# Patient Record
Sex: Female | Born: 1980
Health system: Southern US, Community
[De-identification: ages and names within clinical notes are randomized; demographics above are authoritative.]

## PROBLEM LIST (undated history)

## (undated) ENCOUNTER — Inpatient Hospital Stay (HOSPITAL_COMMUNITY): Payer: Self-pay

## (undated) DIAGNOSIS — F988 Other specified behavioral and emotional disorders with onset usually occurring in childhood and adolescence: Secondary | ICD-10-CM

## (undated) DIAGNOSIS — F329 Major depressive disorder, single episode, unspecified: Secondary | ICD-10-CM

## (undated) DIAGNOSIS — R112 Nausea with vomiting, unspecified: Secondary | ICD-10-CM

## (undated) DIAGNOSIS — R87629 Unspecified abnormal cytological findings in specimens from vagina: Secondary | ICD-10-CM

## (undated) DIAGNOSIS — M51369 Other intervertebral disc degeneration, lumbar region without mention of lumbar back pain or lower extremity pain: Secondary | ICD-10-CM

## (undated) DIAGNOSIS — K760 Fatty (change of) liver, not elsewhere classified: Secondary | ICD-10-CM

## (undated) DIAGNOSIS — M5136 Other intervertebral disc degeneration, lumbar region: Secondary | ICD-10-CM

## (undated) DIAGNOSIS — Z9151 Personal history of suicidal behavior: Secondary | ICD-10-CM

## (undated) DIAGNOSIS — R000 Tachycardia, unspecified: Secondary | ICD-10-CM

## (undated) DIAGNOSIS — K5900 Constipation, unspecified: Secondary | ICD-10-CM

## (undated) DIAGNOSIS — IMO0002 Reserved for concepts with insufficient information to code with codable children: Secondary | ICD-10-CM

## (undated) DIAGNOSIS — Z915 Personal history of self-harm: Secondary | ICD-10-CM

## (undated) DIAGNOSIS — R1031 Right lower quadrant pain: Secondary | ICD-10-CM

## (undated) DIAGNOSIS — N643 Galactorrhea not associated with childbirth: Secondary | ICD-10-CM

## (undated) DIAGNOSIS — I4711 Inappropriate sinus tachycardia, so stated: Secondary | ICD-10-CM

## (undated) DIAGNOSIS — Z9889 Other specified postprocedural states: Secondary | ICD-10-CM

## (undated) DIAGNOSIS — M2550 Pain in unspecified joint: Secondary | ICD-10-CM

## (undated) DIAGNOSIS — I7300 Raynaud's syndrome without gangrene: Secondary | ICD-10-CM

## (undated) DIAGNOSIS — R7989 Other specified abnormal findings of blood chemistry: Secondary | ICD-10-CM

## (undated) DIAGNOSIS — M419 Scoliosis, unspecified: Secondary | ICD-10-CM

## (undated) DIAGNOSIS — E785 Hyperlipidemia, unspecified: Secondary | ICD-10-CM

## (undated) DIAGNOSIS — G43909 Migraine, unspecified, not intractable, without status migrainosus: Secondary | ICD-10-CM

## (undated) DIAGNOSIS — M329 Systemic lupus erythematosus, unspecified: Secondary | ICD-10-CM

## (undated) DIAGNOSIS — Z789 Other specified health status: Secondary | ICD-10-CM

## (undated) DIAGNOSIS — R200 Anesthesia of skin: Secondary | ICD-10-CM

## (undated) DIAGNOSIS — N6452 Nipple discharge: Secondary | ICD-10-CM

## (undated) DIAGNOSIS — Z87898 Personal history of other specified conditions: Secondary | ICD-10-CM

## (undated) DIAGNOSIS — F32A Depression, unspecified: Secondary | ICD-10-CM

## (undated) DIAGNOSIS — F419 Anxiety disorder, unspecified: Secondary | ICD-10-CM

## (undated) DIAGNOSIS — M3500 Sicca syndrome, unspecified: Secondary | ICD-10-CM

## (undated) DIAGNOSIS — Z85828 Personal history of other malignant neoplasm of skin: Secondary | ICD-10-CM

## (undated) DIAGNOSIS — Z87410 Personal history of cervical dysplasia: Secondary | ICD-10-CM

## (undated) DIAGNOSIS — R251 Tremor, unspecified: Secondary | ICD-10-CM

## (undated) HISTORY — PX: TONSILLECTOMY: SUR1361

## (undated) HISTORY — PX: LEEP: SHX91

## (undated) HISTORY — DX: Fatty (change of) liver, not elsewhere classified: K76.0

## (undated) HISTORY — DX: Migraine, unspecified, not intractable, without status migrainosus: G43.909

## (undated) HISTORY — DX: Galactorrhea not associated with childbirth: N64.3

## (undated) HISTORY — DX: Raynaud's syndrome without gangrene: I73.00

## (undated) HISTORY — DX: Other specified abnormal findings of blood chemistry: R79.89

## (undated) HISTORY — DX: Other specified behavioral and emotional disorders with onset usually occurring in childhood and adolescence: F98.8

## (undated) HISTORY — DX: Hyperlipidemia, unspecified: E78.5

## (undated) HISTORY — DX: Constipation, unspecified: K59.00

## (undated) HISTORY — PX: DILATION AND CURETTAGE OF UTERUS: SHX78

## (undated) HISTORY — DX: Inappropriate sinus tachycardia, so stated: I47.11

## (undated) HISTORY — DX: Tachycardia, unspecified: R00.0

---
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
## 2001-07-20 ENCOUNTER — Ambulatory Visit (HOSPITAL_COMMUNITY): Admission: RE | Admit: 2001-07-20 | Discharge: 2001-07-20 | Payer: Self-pay | Admitting: Family Medicine

## 2002-01-31 ENCOUNTER — Other Ambulatory Visit: Admission: RE | Admit: 2002-01-31 | Discharge: 2002-01-31 | Payer: Self-pay | Admitting: Family Medicine

## 2003-12-16 ENCOUNTER — Emergency Department (HOSPITAL_COMMUNITY): Admission: EM | Admit: 2003-12-16 | Discharge: 2003-12-16 | Payer: Self-pay | Admitting: Emergency Medicine

## 2004-01-09 ENCOUNTER — Other Ambulatory Visit: Admission: RE | Admit: 2004-01-09 | Discharge: 2004-01-09 | Payer: Self-pay | Admitting: Obstetrics and Gynecology

## 2004-03-10 ENCOUNTER — Emergency Department (HOSPITAL_COMMUNITY): Admission: EM | Admit: 2004-03-10 | Discharge: 2004-03-10 | Payer: Self-pay | Admitting: Emergency Medicine

## 2004-03-10 ENCOUNTER — Inpatient Hospital Stay (HOSPITAL_COMMUNITY): Admission: RE | Admit: 2004-03-10 | Discharge: 2004-03-17 | Payer: Self-pay | Admitting: Psychiatry

## 2004-03-31 ENCOUNTER — Encounter (INDEPENDENT_AMBULATORY_CARE_PROVIDER_SITE_OTHER): Payer: Self-pay | Admitting: Specialist

## 2004-03-31 ENCOUNTER — Ambulatory Visit (HOSPITAL_COMMUNITY): Admission: RE | Admit: 2004-03-31 | Discharge: 2004-03-31 | Payer: Self-pay | Admitting: Obstetrics and Gynecology

## 2004-07-06 ENCOUNTER — Other Ambulatory Visit: Admission: RE | Admit: 2004-07-06 | Discharge: 2004-07-06 | Payer: Self-pay | Admitting: Obstetrics and Gynecology

## 2005-01-17 ENCOUNTER — Other Ambulatory Visit: Admission: RE | Admit: 2005-01-17 | Discharge: 2005-01-17 | Payer: Self-pay | Admitting: Obstetrics and Gynecology

## 2005-06-17 ENCOUNTER — Other Ambulatory Visit: Admission: RE | Admit: 2005-06-17 | Discharge: 2005-06-17 | Payer: Self-pay | Admitting: Obstetrics and Gynecology

## 2005-06-20 ENCOUNTER — Other Ambulatory Visit: Admission: RE | Admit: 2005-06-20 | Discharge: 2005-06-20 | Payer: Self-pay | Admitting: Obstetrics and Gynecology

## 2005-07-11 ENCOUNTER — Ambulatory Visit: Payer: Self-pay | Admitting: Internal Medicine

## 2005-07-14 ENCOUNTER — Emergency Department (HOSPITAL_COMMUNITY): Admission: EM | Admit: 2005-07-14 | Discharge: 2005-07-14 | Payer: Self-pay | Admitting: Emergency Medicine

## 2005-07-19 ENCOUNTER — Ambulatory Visit: Payer: Self-pay | Admitting: Internal Medicine

## 2005-07-19 ENCOUNTER — Inpatient Hospital Stay (HOSPITAL_COMMUNITY): Admission: EM | Admit: 2005-07-19 | Discharge: 2005-07-21 | Payer: Self-pay | Admitting: *Deleted

## 2005-07-23 ENCOUNTER — Emergency Department (HOSPITAL_COMMUNITY): Admission: EM | Admit: 2005-07-23 | Discharge: 2005-07-23 | Payer: Self-pay | Admitting: Emergency Medicine

## 2005-08-03 ENCOUNTER — Inpatient Hospital Stay (HOSPITAL_COMMUNITY): Admission: RE | Admit: 2005-08-03 | Discharge: 2005-08-07 | Payer: Self-pay | Admitting: Psychiatry

## 2005-08-04 ENCOUNTER — Encounter: Payer: Self-pay | Admitting: Psychiatry

## 2005-08-04 ENCOUNTER — Ambulatory Visit: Payer: Self-pay | Admitting: Psychiatry

## 2005-08-20 ENCOUNTER — Inpatient Hospital Stay (HOSPITAL_COMMUNITY): Admission: EM | Admit: 2005-08-20 | Discharge: 2005-08-23 | Payer: Self-pay | Admitting: Emergency Medicine

## 2005-08-21 ENCOUNTER — Ambulatory Visit: Payer: Self-pay | Admitting: Internal Medicine

## 2006-02-09 ENCOUNTER — Emergency Department (HOSPITAL_COMMUNITY): Admission: EM | Admit: 2006-02-09 | Discharge: 2006-02-09 | Payer: Self-pay | Admitting: Emergency Medicine

## 2006-07-05 ENCOUNTER — Inpatient Hospital Stay (HOSPITAL_COMMUNITY): Admission: AD | Admit: 2006-07-05 | Discharge: 2006-07-05 | Payer: Self-pay | Admitting: Obstetrics & Gynecology

## 2007-01-14 ENCOUNTER — Emergency Department (HOSPITAL_COMMUNITY): Admission: EM | Admit: 2007-01-14 | Discharge: 2007-01-14 | Payer: Self-pay | Admitting: *Deleted

## 2008-11-07 ENCOUNTER — Emergency Department (HOSPITAL_COMMUNITY): Admission: EM | Admit: 2008-11-07 | Discharge: 2008-11-08 | Payer: Self-pay | Admitting: Emergency Medicine

## 2009-10-10 HISTORY — PX: GANGLION CYST EXCISION: SHX1691

## 2009-12-07 ENCOUNTER — Encounter: Admission: RE | Admit: 2009-12-07 | Discharge: 2009-12-07 | Payer: Self-pay | Admitting: Family Medicine

## 2010-10-07 ENCOUNTER — Emergency Department: Payer: Self-pay | Admitting: Emergency Medicine

## 2010-10-10 ENCOUNTER — Emergency Department (HOSPITAL_BASED_OUTPATIENT_CLINIC_OR_DEPARTMENT_OTHER)
Admission: EM | Admit: 2010-10-10 | Discharge: 2010-10-10 | Payer: Self-pay | Source: Home / Self Care | Admitting: Emergency Medicine

## 2010-10-11 ENCOUNTER — Ambulatory Visit (HOSPITAL_BASED_OUTPATIENT_CLINIC_OR_DEPARTMENT_OTHER)
Admission: RE | Admit: 2010-10-11 | Discharge: 2010-10-11 | Payer: Self-pay | Source: Home / Self Care | Attending: Emergency Medicine | Admitting: Emergency Medicine

## 2010-10-16 ENCOUNTER — Encounter
Admission: RE | Admit: 2010-10-16 | Discharge: 2010-10-16 | Payer: Self-pay | Source: Home / Self Care | Attending: Family Medicine | Admitting: Family Medicine

## 2010-11-10 ENCOUNTER — Encounter: Payer: Self-pay | Admitting: Family Medicine

## 2010-12-20 LAB — CBC
HCT: 37.3 % (ref 36.0–46.0)
Hemoglobin: 13.1 g/dL (ref 12.0–15.0)
MCH: 30.5 pg (ref 26.0–34.0)
MCHC: 35.1 g/dL (ref 30.0–36.0)
MCV: 86.9 fL (ref 78.0–100.0)
Platelets: 310 10*3/uL (ref 150–400)
RBC: 4.29 MIL/uL (ref 3.87–5.11)
RDW: 12 % (ref 11.5–15.5)
WBC: 10 10*3/uL (ref 4.0–10.5)

## 2010-12-20 LAB — COMPREHENSIVE METABOLIC PANEL
ALT: 19 U/L (ref 0–35)
AST: 19 U/L (ref 0–37)
Albumin: 3.9 g/dL (ref 3.5–5.2)
Alkaline Phosphatase: 54 U/L (ref 39–117)
BUN: 15 mg/dL (ref 6–23)
CO2: 24 mEq/L (ref 19–32)
Calcium: 9.4 mg/dL (ref 8.4–10.5)
Chloride: 107 mEq/L (ref 96–112)
Creatinine, Ser: 0.5 mg/dL (ref 0.4–1.2)
GFR calc Af Amer: >60 mL/min (ref >60)
GFR calc non Af Amer: >60 mL/min (ref >60)
Glucose, Bld: 87 mg/dL (ref 70–99)
Potassium: 3.8 mEq/L (ref 3.5–5.1)
Sodium: 142 mEq/L (ref 135–145)
Total Bilirubin: 0.4 mg/dL (ref 0.3–1.2)
Total Protein: 6.8 g/dL (ref 6.0–8.3)

## 2010-12-20 LAB — URINALYSIS, ROUTINE W REFLEX MICROSCOPIC
Bilirubin Urine: NEGATIVE
Glucose, UA: NEGATIVE mg/dL
Hgb urine dipstick: NEGATIVE
Ketones, ur: NEGATIVE mg/dL
Nitrite: NEGATIVE
Protein, ur: NEGATIVE mg/dL
Specific Gravity, Urine: 1.014 (ref 1.005–1.030)
Urobilinogen, UA: 0.2 mg/dL (ref 0.0–1.0)
pH: 7 (ref 5.0–8.0)

## 2010-12-20 LAB — DIFFERENTIAL
Basophils Absolute: 0 10*3/uL (ref 0.0–0.1)
Basophils Relative: 0 % (ref 0–1)
Eosinophils Absolute: 0 10*3/uL (ref 0.0–0.7)
Eosinophils Relative: 0 % (ref 0–5)
Lymphocytes Relative: 46 % (ref 12–46)
Lymphs Abs: 4.6 10*3/uL — ABNORMAL HIGH (ref 0.7–4.0)
Monocytes Absolute: 0.6 10*3/uL (ref 0.1–1.0)
Monocytes Relative: 6 % (ref 3–12)
Neutro Abs: 4.7 10*3/uL (ref 1.7–7.7)
Neutrophils Relative %: 47 % (ref 43–77)

## 2010-12-20 LAB — LIPASE, BLOOD: Lipase: 115 U/L (ref 23–300)

## 2010-12-20 LAB — PREGNANCY, URINE: Preg Test, Ur: NEGATIVE

## 2010-12-22 ENCOUNTER — Other Ambulatory Visit: Payer: Self-pay | Admitting: Podiatry

## 2011-01-11 ENCOUNTER — Other Ambulatory Visit: Payer: Self-pay | Admitting: Family Medicine

## 2011-01-11 ENCOUNTER — Other Ambulatory Visit (HOSPITAL_COMMUNITY)
Admission: RE | Admit: 2011-01-11 | Discharge: 2011-01-11 | Disposition: A | Discharge status: Home / Self Care | Payer: BC Managed Care – PPO | Source: Ambulatory Visit | Attending: Family Medicine | Admitting: Family Medicine

## 2011-01-11 DIAGNOSIS — Z0000 Encounter for general adult medical examination without abnormal findings: Secondary | ICD-10-CM | POA: Insufficient documentation

## 2011-01-24 LAB — URINALYSIS, ROUTINE W REFLEX MICROSCOPIC
Bilirubin Urine: NEGATIVE
Glucose, UA: NEGATIVE mg/dL
Hgb urine dipstick: NEGATIVE
Ketones, ur: NEGATIVE mg/dL
Nitrite: NEGATIVE
Protein, ur: NEGATIVE mg/dL
Specific Gravity, Urine: 1.024 (ref 1.005–1.030)
Urobilinogen, UA: 0.2 mg/dL (ref 0.0–1.0)
pH: 5.5 (ref 5.0–8.0)

## 2011-01-24 LAB — CBC
HCT: 41.5 % (ref 36.0–46.0)
Hemoglobin: 14.4 g/dL (ref 12.0–15.0)
MCHC: 34.7 g/dL (ref 30.0–36.0)
MCV: 90.6 fL (ref 78.0–100.0)
Platelets: 291 10*3/uL (ref 150–400)
RBC: 4.58 MIL/uL (ref 3.87–5.11)
RDW: 11.9 % (ref 11.5–15.5)
WBC: 14.1 10*3/uL — ABNORMAL HIGH (ref 4.0–10.5)

## 2011-01-24 LAB — DIFFERENTIAL
Basophils Absolute: 0.3 10*3/uL — ABNORMAL HIGH (ref 0.0–0.1)
Basophils Relative: 2 % — ABNORMAL HIGH (ref 0–1)
Eosinophils Absolute: 0 10*3/uL (ref 0.0–0.7)
Eosinophils Relative: 0 % (ref 0–5)
Lymphocytes Relative: 15 % (ref 12–46)
Lymphs Abs: 2.1 10*3/uL (ref 0.7–4.0)
Monocytes Absolute: 0.1 10*3/uL (ref 0.1–1.0)
Monocytes Relative: 1 % — ABNORMAL LOW (ref 3–12)
Neutro Abs: 11.6 10*3/uL — ABNORMAL HIGH (ref 1.7–7.7)
Neutrophils Relative %: 82 % — ABNORMAL HIGH (ref 43–77)

## 2011-01-24 LAB — POCT I-STAT, CHEM 8
BUN: 9 mg/dL (ref 6–23)
Calcium, Ion: 1.23 mmol/L (ref 1.12–1.32)
Chloride: 107 mEq/L (ref 96–112)
Creatinine, Ser: 0.8 mg/dL (ref 0.4–1.2)
Glucose, Bld: 106 mg/dL — ABNORMAL HIGH (ref 70–99)
HCT: 45 % (ref 36.0–46.0)
Hemoglobin: 15.3 g/dL — ABNORMAL HIGH (ref 12.0–15.0)
Potassium: 3.5 mEq/L (ref 3.5–5.1)
Sodium: 141 mEq/L (ref 135–145)
TCO2: 20 mmol/L (ref 0–100)

## 2011-01-24 LAB — POCT PREGNANCY, URINE: Preg Test, Ur: NEGATIVE

## 2011-01-24 LAB — GLUCOSE, CAPILLARY: Glucose-Capillary: 96 mg/dL (ref 70–99)

## 2011-02-25 NOTE — Discharge Summary (Signed)
[name], [name]               ACCOUNT NO.:  [id number]   MEDICAL RECORD NO.:  [id number]          PATIENT TYPE:  IPS   LOCATION:  0505                          [facility]ACILITY:  [facility]   PHYSICIAN:  Anselm Jungling, MD  DATE OF BIRTH:  1981-01-15   DATE OF ADMISSION:  08/03/2005  DATE OF DISCHARGE:  08/07/2005                                 DISCHARGE SUMMARY   IDENTIFYING DATA AND REASON FOR ADMISSION:  This was the first [facility] admission  for Sharon Hartman, Sharon 30 year old single white Hartman, who was admitted on Sharon  voluntary basis due to history of depression, thinking about death in an  obsessive fashion and recent OD during the week prior to admission.  Please  refer to the admission note for further details pertaining to the symptoms,  circumstances and history that lead to her hospitalization.  She was given  an initial Axis I diagnosis of bipolar disorder, not otherwise specified,  rule out alcohol abuse and rule out post traumatic stress disorder.  In  addition, she appeared to present with Sharon history consistent with borderline  personality traits.   MEDICAL AND LABORATORY:  The patient was physically assessed by the  psychiatric nurse practitioner at the time of admission.  Routine CBC was  within normal limits.  Chemistry panel showed slightly decreased potassium  of 3.4 which was not felt to be clinically significant.  TSH was within  normal limits.   HOSPITAL COURSE:  The patient was admitted to the adult inpatient  psychiatric service.  Although she came to [place] with Sharon history of bipolar  disorder, she did not appear to be manic or psychotic in any way.  She  appeared to be depressed.  She denied any suicidal ideation and was to  contract for safety throughout her inpatient stay.  She was Sharon good  participant in treatment groups and activities.  She had difficulty sleeping  and to address this trazodone 100 mg and Ambien 10 mg were given at bedtime  with fair results.   She came to  [place] on Sharon regimen of Lamictal 200 mg daily, Prozac 10 mg daily,  and trazodone 50 mg q.h.s.  Lamictal and Prozac were continued.  The patient  also had Sharon history of ADHD and had been taking Adderall XR 75 mg q.Sharon.m.  This was later changed to Adderall XR 40 mg daily.  The patient had injured  her wrist prior to admission.  An x-ray revealed no fracture.  She was given  Percocet on Sharon p.r.n. basis to address pain as well as Motrin.   Zyprexa 5 mg at h.s. was administered towards the end of her stay, again to  help stabilize sleep and anxiety.   Sharon family session occurred prior to her discharge including her older sister  and brother-in-law.  She was somewhat disappointed that her father did not  come to this meeting.  The family session was felt to be productive.  Her  sister and brother-in-law were quite supportive.   The patient was discharged on the fifth hospital day.  She was absent  suicidal  ideation at that time.   AFTERCARE:  The pulse was to follow up with Dr. Evelene Croon on August 08, 2005,  and Dr. Whitman Hero, her psychologist, on the same day.   DISCHARGE MEDICATIONS:  1.  Lamictal 200 mg p.o. daily.  2.  Prozac 10 mg daily.  3.  Zyprexa 5 mg q.h.s.  4.  Ambien 10 mg q.h.s.  5.  Adderall XR 40 mg q.Sharon.m.   DISCHARGE DIAGNOSES:  AXIS I:  Bipolar affective disorder, by history,  depressed without psychotic features.  Rule out post traumatic stress  disorder.  History of attention deficit hyperactivity disorder.  AXIS II:  Borderline personality traits.  AXIS III:  No acute or chronic illnesses.  AXIS IV:  Stressors severe.  AXIS V:  Global assessment of function on discharge 65.           ______________________________  Anselm Jungling, MD  Electronically Signed     SPB/MEDQ  D:  08/12/2005  T:  08/12/2005  Job:  [id number]

## 2011-02-25 NOTE — Discharge Summary (Signed)
Sharon Hartman, Sharon Hartman                         ACCOUNT NO.:  [id number]   MEDICAL RECORD NO.:  [id number]                   PATIENT TYPE:  IPS   LOCATION:  0307                                 [facility]ILITY[facility]  [facility]   PHYSICIAN:  Jeanice Lim, M.D.              DATE OF BIRTH:  1980/11/24   DATE OF ADMISSION:  03/10/2004  DATE OF DISCHARGE:  03/17/2004                                 DISCHARGE SUMMARY   IDENTIFYING DATA:  This is a 30 year old single Caucasian female  involuntarily committed.  She reported overdosing on Klonopin.  She denied  suicidal ideation.  She had told sister that she hoped she would not wake up  but denied this being a suicide attempt.  She wanted to really sleep.  She  described multiple stressors including financial, not making enough at her  job, having clear highs and lows with racing thoughts and periods of  hypomania to mania and depression with severe depressive episodes for close  to two years without receiving adequate treatment.  She had seen Christene Slates.  Katrinka Blazing, M.D., in the past but only been prescribed Klonopin and  antidepressant at that time; unclear whether she had refused mood  stabilizers.  She also had been drinking four beers a night to try to help  with sleep just prior to admission and then previously would drink one to  two times per week but would occasionally drink excess and did have a  history of a DWI.   FAMILY HISTORY:  Mother possibly was bipolar.   PAST MEDICAL HISTORY:  She sees Dr. Ashley Royalty, primary care physician.  Medical problems include precervical cancer.  She has followup June 27.  She  plans to follow up with Milagros Evener, M.D.   MEDICATIONS:  1. Klonopin; prescribed by Christene Slates. Katrinka Blazing, M.D.  This had been prescribed     over a year ago and she still had some supply and she would take this     sporadically.  2. Birth control pills.   DRUG ALLERGIES:  No known drug allergies.   PHYSICAL EXAMINATION:  GENERAL:   Essentially within normal limits.  NEUROLOGIC:  Essentially within normal limits.   LABORATORY DATA:  Routine admission labs:  Within normal limits.   MENTAL STATUS EXAM:  Alert young female, cooperative, good eye contact.  Speech: Clear.  Mood: Depressed.  Affect: Restricted.  Thought process: Goal  directed without overt psychotic symptoms or dangerous ideation.  Cognitive:  Intact.  Judgment and insight: Fair, not appearing to be motivated at this  time to directly address her bipolar disorder and receive appropriate  treatment.   ADMISSION DIAGNOSES:   AXIS I:  Bipolar disorder type I, mixed.   AXIS II:  None.   AXIS III:  History of cervical cancer.   AXIS IV:  Moderate stressors related to financial and limited support  system.   AXIS V:  30/65  HOSPITAL COURSE:  The patient was admitted, ordered routine p.r.n.  medications, underwent further monitoring, and was encouraged to participate  in individual, group, and milieu therapy.  The patient was placed on safety  checks.  The patient worked on [employer].  Mood was  stabilized gradually with medical intervention.  The patient was educated  regarding her bipolar disorder and healthy behavioral changes to further  maintenance of stabilization.  She tolerated medication changes and clinical  intervention well with a positive response, gradually showing an increase in  insight and judgment.  She identified alcohol as a mood destabilizer and  high risk for the patient to be using in addition to taking medications.   CONDITION ON DISCHARGE:  She was discharged in markedly improved condition.  Mood was euthymic.  Affect: Brighter.  Thought processes: Goal directed.  Thought content: Negative for dangerous ideation or psychotic symptoms.  The  patient, again, had shown improvement in judgment and insight as well as  coping skills.  She was given medication education again.   DISCHARGE MEDICATIONS:   1. Trileptal 300 mg one q.a.m. and one and a half q.h.s.  2. Nicotine patch 14 mg one daily for seven days and then a 7 mg patch daily     for seven days.  3. Continue contraceptives as previously prescribed.  4. Lamictal 25 mg q.a.m.  5. Prozac 20 mg q.a.m.  6. Librium 25 mg q.h.s. and then 10 mg one three times a day to gradually     taper and discharge in the outpatient setting.  7. Seroquel 25 mg q.h.s.  8. Risperdal 0.5 mg q.h.s.  9. Ambien 10 mg q.h.s. p.r.n.   FOLLOW UP:  The patient was to follow up with Milagros Evener, M.D., on  Saturday, June 18 at 2:45 p.m. and [facility] on Monday, June 13 at 10  a.m.  She also was to consider individual therapy at [facility], which  offers therapy on a sliding scale basis.   DISCHARGE DIAGNOSES:   AXIS I:  Bipolar disorder type I, mixed.   AXIS II:  None.   AXIS III:  History of cervical cancer.   AXIS IV:  Moderate stressors related to financial and limited support  system.   AXIS V:  Global assessment of functioning on discharge was 55.                                               Jeanice Lim, M.D.   JEM/MEDQ  D:  03/30/2004  T:  03/31/2004  Job:  [id number]
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
## 2011-02-25 NOTE — Op Note (Signed)
NAME:  Sharon Hartman, Sharon Hartman                         ACCOUNT NO.:  [id number]   MEDICAL RECORD NO.:  [id number]                   PATIENT TYPE:  AMB   LOCATION:  SDC                                  FACILITY:  [facility]   PHYSICIAN:  James A. Ashley Royalty, M.D.             DATE OF BIRTH:  Sep 15, 1981   DATE OF PROCEDURE:  03/31/2004  DATE OF DISCHARGE:                                 OPERATIVE REPORT   PREOPERATIVE DIAGNOSES:  1. Cervical intraepithelial neoplasia, grade II.  2. History of vagal reaction during office procedure.   POSTOPERATIVE DIAGNOSES:  1. Cervical intraepithelial neoplasia, grade II.  2. History of vagal reaction during office procedure.   1. Pathology pending.   PROCEDURE:  Loop electrical excisional procedure.   SURGEON:  Rudy Jew. Ashley Royalty, M.D.   ANESTHESIA:  Monitored anesthesia care with 1% Xylocaine paracervical block  (20 mL).   ESTIMATED BLOOD LOSS:  25 mL.   COMPLICATIONS:  None.   PACKS AND DRAINS:  None.   DESCRIPTION OF PROCEDURE:  The patient was taken to the operating room and  placed in the dorsal supine position.  After adequate IV sedation was  administered, she was placed in the lithotomy position and draped with  sterile drapes.  Galvanized speculum was placed per vagina.  The cervix was  visualized through the colposcope.  No __________ lesions were noted.  Cervix was then invaded with 5% acidic acid and revisualized with  colposcope.  The transformation zone was identified and decision was made to  go with the green wire [facility]).  The cervix was infiltrated with  1% Xylocaine to create a good paracervical block.  The anterior lip was  grasped with a single-tooth tenaculum.  The LEEP specimen was taken using  the cutting wave form at 25 watts power.  The ball was then used to obtain  hemostasis at 40 watts power using the coagulation wave form.  Hemostasis  was noted.  The cervix was then treated with Monsel's solution.  Hemostasis  was noted.  The vaginal instruments were removed and the procedure  terminated.  The specimen was incised, pinned out on cork and submitted to  pathology for histologic studies.   The patient tolerated the procedure extremely well and was returned to the  recovery room in good condition.                                              James A. Ashley Royalty, M.D.   JAM/MEDQ  D:  03/31/2004  T:  03/31/2004  Job:  [id number]
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
## 2011-02-25 NOTE — H&P (Signed)
NAME:  Sharon Hartman, ROBOTHAM                         ACCOUNT NO.:  [id number]   MEDICAL RECORD NO.:  [id number]                   PATIENT TYPE:  AMB   LOCATION:  SDC                                  FACILITY:  [facility]   PHYSICIAN:  James A. Ashley Royalty, M.D.             DATE OF BIRTH:  [birth year]   DATE OF ADMISSION:  03/31/2004  DATE OF DISCHARGE:                                HISTORY & PHYSICAL   HISTORY OF PRESENT ILLNESS:  This is a 30 year old gravida 1, para 0, Ab1,  who presented to me, January 09, 2004, for evaluation of an abnormal Pap  smear obtained at [facility].  She had a colposcopy  at my office, February 06, 2004, and the colposcopic-directed biopsy revealed  CIN-2.  During the colposcopy, she experienced a vagal reaction.  She is  hence for LEEP in a controlled hospital setting.   MEDICATIONS:  Nordette.   PAST MEDICAL HISTORY:   MEDICAL:  Negative.   SURGICAL:  Tonsillectomy.   ALLERGIES:  Negative.   FAMILY HISTORY:  Family history is positive for hypertension, diabetes,  breast cancer and uterine cancer.   SOCIAL HISTORY:  The patient uses alcohol moderately to heavily.  She smokes  approximately 2/3 pack of cigarettes per day.  She is a Consulting civil engineer at [employer].   PHYSICAL EXAMINATION:  GENERAL:  Well-developed, well-nourished, pleasant  white female in no acute distress.  VITAL SIGNS:  Afebrile.  Vital signs stable.  SKIN:  Skin warm and dry without lesions.  LYMPH:  There is no supraclavicular, cervical or inguinal adenopathy.  HEENT:  Normocephalic.  NECK:  Neck supple without thyromegaly.  CHEST:  Lungs are clear.  HEART:  Regular rate and rhythm without murmurs, rubs, or gallops.  BREASTS:  Breast exam deferred.  ABDOMEN:  Abdomen soft and nontender without masses or organomegaly.  Bowel  sounds are active.  MUSCULOSKELETAL:  Examination reveals full range of motion without edema,  cyanosis or CVA tenderness.  PELVIC EXAMINATION (January 09, 2004):   External genitalia within normal  limits.  Vagina and cervix were without gross lesions.  Bimanual examination  reveals uterus to be approximately 8 x 4 x 4 cm and no adnexal masses are  palpable.   IMPRESSION:  1. Cervical intraepithelial neoplasia, grade 2, on colposcopic-directed     biopsy.  2. Smoker.  3. Moderate-to-heavy alcohol consumption.  4. History of vagal reaction in context of an office procedure.   PLAN:  Loop electrical excision procedure at [facility].  Risks,  benefits, complications and alternatives were fully discussed with the  patient.  She states she understands and accepts.  Questions invited and  answered.  James A. Ashley Royalty, M.D.    JAM/MEDQ  D:  03/31/2004  T:  03/31/2004  Job:  [id number]

## 2011-02-25 NOTE — Discharge Summary (Signed)
Sharon Hartman, Sharon Hartman               ACCOUNT NO.:  [id number]   MEDICAL RECORD NO.:  [id number]          PATIENT TYPE:  INP   LOCATION:  0160                         FACILITY:  [facility]   PHYSICIAN:  Rene Paci, M.D. LHCDATE OF BIRTH:  [birth year]   DATE OF ADMISSION:  08/20/2005  DATE OF DISCHARGE:  08/23/2005                                 DISCHARGE SUMMARY   DISCHARGE DIAGNOSES:  1.  Intentional overdose of benzodiazepine (Librium) and Tylenol P.M., no      toxicity effect.  2.  Possible suicidal gesture.  3.  Bipolar disorder, with previous psychiatric hospitalization.  4.  History of alcoholism, currently in Alcoholics Anonymous.  5.  Asymptomatic mild hypotension.   DISCHARGE MEDICATIONS:  As prior to admission without change and include:  1.  Lamictal 200 mg daily.  2.  Adderall XR 75 mg daily.  3.  Prozac 10 mg daily.  4.  Depakote 1000 mg daily.  5.  Librium dose unknown but continuous prior to admission.   DISPOSITION:  The patient is status post inpatient psych evaluation and  medical clearance.  As she is not committable, we recommend outpatient psych  followup.  The patient has declined inpatient psych evaluation at this time  and is planning for discharge home, where she lives alone.  Agrees to  contact her psychiatrist, Dr. Evelene Croon, as well as primary MD in the next few  weeks for followup.  Tolerating p.o.  Feels well.  No complaints.   HOSPITAL FOLLOWUP:  As above, to be arranged by patient with her  psychiatrist, Dr. Evelene Croon, and Dr. Jonny Ruiz in the next few weeks.   HOSPITAL COURSE BY PROBLEM:  1.  Overdose.  The patient is a 30 year old woman with history of bipolar      disorder who presented to [facility] after taking 24      tablets of Tylenol P.M. and 45 Librium tablets, dose unknown.  Fell      asleep but then awoke and called her sister.  The patient said she did      so because she was bored staying sober as she has been in AA for her  alcoholism, so there is concern for a possible suicidal gesture.  After      discussion with Poison Control, she received IV Mucomyst loading dose as      well as infusion over 25 hours.  Acetaminophen levels were nontoxic on      followup.  LFTs remained normal.  Psych evaluation was obtained on      Monday for a clearance.  Dr. Jeanie Sewer felt that she was not at risk for      harm to self or others and recommended inpatient evaluation but declined      by patient, which was acceptable as she was not committable, and      recommended outpatient psych followup upon discharge, which will be      arranged by care management.  Otherwise, no other psychiatric med      changes for her bipolar regimen.  2.  Mild asymptomatic hypotension.  The patient's blood pressure ranged      systolic 80s-100 during the 48 hours prior to discharge.  The patient      did earlier complain of overall weakness and denied any other overdose      of      antihypertensives or other medications.  Besides the nonspecific      weakness, the patient was otherwise asymptomatic and was felt stable for      discharge home as this may very well be in her range, being an otherwise      healthy 30 year old woman.      Rene Paci, M.D. [facility]  Electronically Signed     VL/MEDQ  D:  08/23/2005  T:  08/23/2005  Job:  [id number]

## 2011-12-04 ENCOUNTER — Ambulatory Visit (INDEPENDENT_AMBULATORY_CARE_PROVIDER_SITE_OTHER): Payer: BC Managed Care – PPO | Admitting: Family Medicine

## 2011-12-04 VITALS — BP 102/69 | HR 77 | Temp 98.2°F | Resp 16 | Ht 65.5 in | Wt 144.2 lb

## 2011-12-04 DIAGNOSIS — J069 Acute upper respiratory infection, unspecified: Secondary | ICD-10-CM

## 2011-12-04 DIAGNOSIS — F909 Attention-deficit hyperactivity disorder, unspecified type: Secondary | ICD-10-CM | POA: Insufficient documentation

## 2011-12-04 MED ORDER — AZITHROMYCIN 250 MG PO TABS: ORAL_TABLET | ORAL | Status: AC

## 2011-12-04 NOTE — Progress Notes (Signed)
  Subjective:    Patient ID: Sharon Hartman, female    DOB: 03-29-1981, 31 y.o.   MRN: [id number]  Cough This is a new problem. The current episode started in the past 7 days. The problem has been gradually worsening. The problem occurs constantly. The cough is productive of purulent sputum. Associated symptoms include chills, a fever, a sore throat and sweats.  Fever  Associated symptoms include coughing and a sore throat.  Neck Pain  Associated symptoms include a fever.    The neck is characterized by tightness, hurts to move. Exposed to two coworkers with pneumonia.   Review of Systems  Constitutional: Positive for fever and chills.  HENT: Positive for sore throat and neck pain.   Respiratory: Positive for cough.   All other systems reviewed and are negative.       Objective:   Physical Exam  Constitutional: She is oriented to person, place, and time. She appears well-developed and well-nourished.  HENT:  Head: Normocephalic and atraumatic.  Right Ear: External ear normal.  Left Ear: External ear normal.  Mouth/Throat: No oropharyngeal exudate.  Eyes: Conjunctivae and EOM are normal. Pupils are equal, round, and reactive to light.  Neck: Normal range of motion. Neck supple. No JVD present. No tracheal deviation present. No thyromegaly present.  Cardiovascular: Normal rate, normal heart sounds and intact distal pulses.   Pulmonary/Chest: Effort normal and breath sounds normal. No stridor.  Musculoskeletal: Normal range of motion.  Lymphadenopathy:    She has cervical adenopathy.  Neurological: She is alert and oriented to person, place, and time.  Skin: Skin is warm and dry.  Psychiatric: She has a normal mood and affect. Her behavior is normal. Thought content normal.          Assessment & Plan:  Glenford Peers, WITH exposure to pneumonia P: z pak

## 2011-12-31 ENCOUNTER — Emergency Department (HOSPITAL_COMMUNITY): Payer: Worker's Compensation

## 2011-12-31 ENCOUNTER — Encounter (HOSPITAL_COMMUNITY): Payer: Self-pay | Admitting: *Deleted

## 2011-12-31 ENCOUNTER — Emergency Department (HOSPITAL_COMMUNITY)
Admission: EM | Admit: 2011-12-31 | Discharge: 2011-12-31 | Disposition: A | Discharge status: Home / Self Care | Payer: Worker's Compensation | Attending: Emergency Medicine | Admitting: Emergency Medicine

## 2011-12-31 DIAGNOSIS — X500XXA Overexertion from strenuous movement or load, initial encounter: Secondary | ICD-10-CM | POA: Insufficient documentation

## 2011-12-31 DIAGNOSIS — Z79899 Other long term (current) drug therapy: Secondary | ICD-10-CM | POA: Insufficient documentation

## 2011-12-31 DIAGNOSIS — S239XXA Sprain of unspecified parts of thorax, initial encounter: Secondary | ICD-10-CM | POA: Insufficient documentation

## 2011-12-31 DIAGNOSIS — M546 Pain in thoracic spine: Secondary | ICD-10-CM | POA: Insufficient documentation

## 2011-12-31 MED ORDER — CYCLOBENZAPRINE HCL 10 MG PO TABS: 10.0000 mg | ORAL_TABLET | Freq: Two times a day (BID) | ORAL | Status: AC | PRN

## 2011-12-31 MED ORDER — HYDROCODONE-ACETAMINOPHEN 5-325 MG PO TABS: 1.0000 | ORAL_TABLET | ORAL | Status: AC | PRN

## 2011-12-31 NOTE — Discharge Instructions (Signed)
Back Exercises Back exercises help treat and prevent back injuries. The goal of back exercises is to increase the strength of your abdominal and back muscles and the flexibility of your back. These exercises should be started when you no longer have back pain. Back exercises include:  Pelvic Tilt. Lie on your back with your knees bent. Tilt your pelvis until the lower part of your back is against the floor. Hold this position 5 to 10 sec and repeat 5 to 10 times.   Knee to Chest. Pull first 1 knee up against your chest and hold for 20 to 30 seconds, repeat this with the other knee, and then both knees. This may be done with the other leg straight or bent, whichever feels better.   Sit-Ups or Curl-Ups. Bend your knees 90 degrees. Start with tilting your pelvis, and do a partial, slow sit-up, lifting your trunk only 30 to 45 degrees off the floor. Take at least 2 to 3 seconds for each sit-up. Do not do sit-ups with your knees out straight. If partial sit-ups are difficult, simply do the above but with only tightening your abdominal muscles and holding it as directed.   Hip-Lift. Lie on your back with your knees flexed 90 degrees. Push down with your feet and shoulders as you raise your hips a couple inches off the floor; hold for 10 seconds, repeat 5 to 10 times.   Back arches. Lie on your stomach, propping yourself up on bent elbows. Slowly press on your hands, causing an arch in your low back. Repeat 3 to 5 times. Any initial stiffness and discomfort should lessen with repetition over time.   Shoulder-Lifts. Lie face down with arms beside your body. Keep hips and torso pressed to floor as you slowly lift your head and shoulders off the floor.  Do not overdo your exercises, especially in the beginning. Exercises may cause you some mild back discomfort which lasts for a few minutes; however, if the pain is more severe, or lasts for more than 15 minutes, do not continue exercises until you see your  caregiver. Improvement with exercise therapy for back problems is slow.  See your caregivers for assistance with developing a proper back exercise program. Document Released: 11/03/2004 Document Revised: 09/15/2011 Document Reviewed: 09/26/2005 [employer] Patient Information 2012 [employer].Back Pain, Adult Low back pain is very common. About 1 in 5 people have back pain.The cause of low back pain is rarely dangerous. The pain often gets better over time.About half of people with a sudden onset of back pain feel better in just 2 weeks. About 8 in 10 people feel better by 6 weeks.  CAUSES Some common causes of back pain include:  Strain of the muscles or ligaments supporting the spine.   Wear and tear (degeneration) of the spinal discs.   Arthritis.   Direct injury to the back.  DIAGNOSIS Most of the time, the direct cause of low back pain is not known.However, back pain can be treated effectively even when the exact cause of the pain is unknown.Answering your caregiver's questions about your overall health and symptoms is one of the most accurate ways to make sure the cause of your pain is not dangerous. If your caregiver needs more information, he or she may order lab work or imaging tests (X-rays or MRIs).However, even if imaging tests show changes in your back, this usually does not require surgery. HOME CARE INSTRUCTIONS For many people, back pain returns.Since low back pain is rarely   dangerous, it is often a condition that people can learn to manageon their own.   Remain active. It is stressful on the back to sit or stand in one place. Do not sit, drive, or stand in one place for more than 30 minutes at a time. Take short walks on level surfaces as soon as pain allows.Try to increase the length of time you walk each day.   Do not stay in bed.Resting more than 1 or 2 days can delay your recovery.   Do not avoid exercise or work.Your body is made to move.It is not dangerous  to be active, even though your back may hurt.Your back will likely heal faster if you return to being active before your pain is gone.   Pay attention to your body when you bend and lift. Many people have less discomfortwhen lifting if they bend their knees, keep the load close to their bodies,and avoid twisting. Often, the most comfortable positions are those that put less stress on your recovering back.   Find a comfortable position to sleep. Use a firm mattress and lie on your side with your knees slightly bent. If you lie on your back, put a pillow under your knees.   Only take over-the-counter or prescription medicines as directed by your caregiver. Over-the-counter medicines to reduce pain and inflammation are often the most helpful.Your caregiver may prescribe muscle relaxant drugs.These medicines help dull your pain so you can more quickly return to your normal activities and healthy exercise.   Put ice on the injured area.   Put ice in a plastic bag.   Place a towel between your skin and the bag.   Leave the ice on for 15 to 20 minutes, 3 to 4 times a day for the first 2 to 3 days. After that, ice and heat may be alternated to reduce pain and spasms.   Ask your caregiver about trying back exercises and gentle massage. This may be of some benefit.   Avoid feeling anxious or stressed.Stress increases muscle tension and can worsen back pain.It is important to recognize when you are anxious or stressed and learn ways to manage it.Exercise is a great option.  SEEK MEDICAL CARE IF:  You have pain that is not relieved with rest or medicine.   You have pain that does not improve in 1 week.   You have new symptoms.   You are generally not feeling well.  SEEK IMMEDIATE MEDICAL CARE IF:   You have pain that radiates from your back into your legs.   You develop new bowel or bladder control problems.   You have unusual weakness or numbness in your arms or legs.   You develop  nausea or vomiting.   You develop abdominal pain.   You feel faint.  Document Released: 09/26/2005 Document Revised: 09/15/2011 Document Reviewed: 02/14/2011 [employer] Patient Information 2012 [employer].Lumbosacral Strain Lumbosacral strain is one of the most common causes of back pain. There are many causes of back pain. Most are not serious conditions. CAUSES  Your backbone (spinal column) is made up of 24 main vertebral bodies, the sacrum, and the coccyx. These are held together by muscles and tough, fibrous tissue (ligaments). Nerve roots pass through the openings between the vertebrae. A sudden move or injury to the back may cause injury to, or pressure on, these nerves. This may result in localized back pain or pain movement (radiation) into the buttocks, down the leg, and into the foot. Sharp, shooting pain   from the buttock down the back of the leg (sciatica) is frequently associated with a ruptured (herniated) disk. Pain may be caused by muscle spasm alone. Your caregiver can often find the cause of your pain by the details of your symptoms and an exam. In some cases, you may need tests (such as X-rays). Your caregiver will work with you to decide if any tests are needed based on your specific exam. HOME CARE INSTRUCTIONS   Avoid an underactive lifestyle. Active exercise, as directed by your caregiver, is your greatest weapon against back pain.   Avoid hard physical activities (tennis, racquetball, waterskiing) if you are not in proper physical condition for it. This may aggravate or create problems.   If you have a back problem, avoid sports requiring sudden body movements. Swimming and walking are generally safer activities.   Maintain good posture.   Avoid becoming overweight (obese).   Use bed rest for only the most extreme, sudden (acute) episode. Your caregiver will help you determine how much bed rest is necessary.   For acute conditions, you may put ice on the injured  area.   Put ice in a plastic bag.   Place a towel between your skin and the bag.   Leave the ice on for 15 to 20 minutes at a time, every 2 hours, or as needed.   After you are improved and more active, it may help to apply heat for 30 minutes before activities.  See your caregiver if you are having pain that lasts longer than expected. Your caregiver can advise appropriate exercises or therapy if needed. With conditioning, most back problems can be avoided. SEEK IMMEDIATE MEDICAL CARE IF:   You have numbness, tingling, weakness, or problems with the use of your arms or legs.   You experience severe back pain not relieved with medicines.   There is a change in bowel or bladder control.   You have increasing pain in any area of the body, including your belly (abdomen).   You notice shortness of breath, dizziness, or feel faint.   You feel sick to your stomach (nauseous), are throwing up (vomiting), or become sweaty.   You notice discoloration of your toes or legs, or your feet get very cold.   Your back pain is getting worse.   You have a fever.  MAKE SURE YOU:   Understand these instructions.   Will watch your condition.   Will get help right away if you are not doing well or get worse.  Document Released: 07/06/2005 Document Revised: 09/15/2011 Document Reviewed: 12/26/2008 [employer] Patient Information 2012 [employer]. 

## 2011-12-31 NOTE — ED Provider Notes (Signed)
History     CSN: [id number]  Arrival date & time 12/31/11  1653   First MD Initiated Contact with Patient 12/31/11 1708      Chief Complaint  Patient presents with  . Back Pain    (Consider location/radiation/quality/duration/timing/severity/associated sxs/prior treatment) HPI Comments: Patient here with mid back pain after lifting a patient up in bed - states pain with palpation and movement - denies numbness, tingling, weakness, nausea or vomiting - no previous history of back injury or pain - states has tried taking ibuprofen for the pain without relief.  Patient is a 31 y.o. female presenting with back pain. The history is provided by the patient. No language interpreter was used.  Back Pain  This is a new problem. The current episode started 1 to 2 hours ago. The problem occurs constantly. The problem has not changed since onset.The pain is associated with lifting heavy objects. The pain is present in the thoracic spine. The quality of the pain is described as stabbing. The pain does not radiate. The pain is at a severity of 9/10. The pain is severe. The symptoms are aggravated by bending and twisting. The pain is the same all the time. Stiffness is present all day. Pertinent negatives include no chest pain, no fever, no numbness, no weight loss, no headaches, no abdominal pain, no abdominal swelling, no bowel incontinence, no perianal numbness, no bladder incontinence, no dysuria, no pelvic pain, no leg pain, no paresthesias, no paresis, no tingling and no weakness. She has tried NSAIDs for the symptoms. The treatment provided no relief.    History reviewed. No pertinent past medical history.  Past Surgical History  Procedure Date  . Tonsillectomy     No family history on file.  History  Substance Use Topics  . Smoking status: Former Games developer  . Smokeless tobacco: Not on file  . Alcohol Use: 0.6 oz/week    1 Glasses of wine per week    OB History    Grav Para Term Preterm  Abortions TAB SAB Ect Mult Living                  Review of Systems  Constitutional: Negative for fever and weight loss.  Cardiovascular: Negative for chest pain.  Gastrointestinal: Negative for abdominal pain and bowel incontinence.  Genitourinary: Negative for bladder incontinence, dysuria and pelvic pain.  Musculoskeletal: Positive for back pain.  Neurological: Negative for tingling, weakness, numbness, headaches and paresthesias.  All other systems reviewed and are negative.    Allergies  Prednisone  Home Medications   Current Outpatient Rx  Name Route Sig Dispense Refill  . ACETAMINOPHEN ER 650 MG PO TBCR Oral Take 650 mg by mouth every 8 (eight) hours as needed.    . AMPHETAMINE-DEXTROAMPHETAMINE 10 MG PO TABS Oral Take 10 mg by mouth 2 (two) times daily.    Marland Kitchen FLUOXETINE HCL 20 MG PO TABS Oral Take 20 mg by mouth daily.      BP 121/54  Pulse 78  Temp(Src) 98.5 F (36.9 C) (Oral)  Resp 16  Ht 5\' 6"  (1.676 m)  Wt 138 lb (62.596 kg)  BMI 22.27 kg/m2  SpO2 100%  LMP 11/13/2011  Physical Exam  Nursing note and vitals reviewed. Constitutional: She is oriented to person, place, and time. She appears well-developed and well-nourished. No distress.  HENT:  Head: Normocephalic and atraumatic.  Right Ear: External ear normal.  Left Ear: External ear normal.  Nose: Nose normal.  Mouth/Throat: Oropharynx is clear  and moist. No oropharyngeal exudate.  Eyes: Conjunctivae are normal. Pupils are equal, round, and reactive to light. No scleral icterus.  Neck: Normal range of motion. Neck supple. No spinous process tenderness and no muscular tenderness present.  Cardiovascular: Normal rate and regular rhythm.  Exam reveals no gallop and no friction rub.   No murmur heard. Pulmonary/Chest: Effort normal and breath sounds normal. No respiratory distress. She exhibits no tenderness.  Abdominal: Soft. Bowel sounds are normal. She exhibits no distension. There is no tenderness.    Musculoskeletal:       Thoracic back: She exhibits tenderness and bony tenderness. She exhibits normal range of motion, no swelling, no edema, no deformity and no pain.  Lymphadenopathy:    She has no cervical adenopathy.  Neurological: She is alert and oriented to person, place, and time. No cranial nerve deficit.  Skin: Skin is warm and dry. No rash noted. No erythema. No pallor.  Psychiatric: She has a normal mood and affect. Her behavior is normal. Judgment and thought content normal.    ED Course  Procedures (including critical care time)  Labs Reviewed - No data to display No results found. Results for orders placed during the hospital encounter of 10/10/10  PREGNANCY, URINE      Component Value Range   Preg Test, Ur       Value: NEGATIVE            THE SENSITIVITY OF THIS     METHODOLOGY IS >24 mIU/mL  URINALYSIS, ROUTINE W REFLEX MICROSCOPIC      Component Value Range   Color, Urine YELLOW  YELLOW    APPearance CLEAR  CLEAR    Specific Gravity, Urine 1.014  1.005 - 1.030    pH 7.0  5.0 - 8.0    Glucose, UA NEGATIVE  NEGATIVE (mg/dL)   Hgb urine dipstick NEGATIVE  NEGATIVE    Bilirubin Urine NEGATIVE  NEGATIVE    Ketones, ur NEGATIVE  NEGATIVE (mg/dL)   Protein, ur NEGATIVE  NEGATIVE (mg/dL)   Urobilinogen, UA 0.2  0.0 - 1.0 (mg/dL)   Nitrite NEGATIVE  NEGATIVE    Leukocytes, UA    NEGATIVE    Value: NEGATIVE MICROSCOPIC NOT DONE ON URINES WITH NEGATIVE PROTEIN, BLOOD, LEUKOCYTES, NITRITE, OR GLUCOSE <1000 mg/dL.  CBC      Component Value Range   WBC 10.0  4.0 - 10.5 (K/uL)   RBC 4.29  3.87 - 5.11 (MIL/uL)   Hemoglobin 13.1  12.0 - 15.0 (g/dL)   HCT 45.4  09.8 - 11.9 (%)   MCV 86.9  78.0 - 100.0 (fL)   MCH 30.5  26.0 - 34.0 (pg)   MCHC 35.1  30.0 - 36.0 (g/dL)   RDW 14.7  82.9 - 56.2 (%)   Platelets 310  150 - 400 (K/uL)  COMPREHENSIVE METABOLIC PANEL      Component Value Range   Sodium 142  135 - 145 (mEq/L)   Potassium 3.8  3.5 - 5.1 (mEq/L)   Chloride  107  96 - 112 (mEq/L)   CO2 24  19 - 32 (mEq/L)   Glucose, Bld 87  70 - 99 (mg/dL)   BUN 15  6 - 23 (mg/dL)   Creatinine, Ser .5  0.4 - 1.2 (mg/dL)   Calcium 9.4  8.4 - 13.0 (mg/dL)   Total Protein 6.8  6.0 - 8.3 (g/dL)   Albumin 3.9  3.5 - 5.2 (g/dL)   AST 19  0 - 37 (U/L)  ALT 19  0 - 35 (U/L)   Alkaline Phosphatase 54  39 - 117 (U/L)   Total Bilirubin 0.4  0.3 - 1.2 (mg/dL)   GFR calc non Af Amer >60  >60 (mL/min)   GFR calc Af Amer    >60 (mL/min)   Value: >60            The eGFR has been calculated     using the MDRD equation.     This calculation has not been     validated in all clinical     situations.     eGFR's persistently     <60 mL/min signify     possible Chronic Kidney Disease.  DIFFERENTIAL      Component Value Range   Neutrophils Relative 47  43 - 77 (%)   Neutro Abs 4.7  1.7 - 7.7 (K/uL)   Lymphocytes Relative 46  12 - 46 (%)   Lymphs Abs 4.6 (*) 0.7 - 4.0 (K/uL)   Monocytes Relative 6  3 - 12 (%)   Monocytes Absolute 0.6  0.1 - 1.0 (K/uL)   Eosinophils Relative 0  0 - 5 (%)   Eosinophils Absolute 0.0  0.0 - 0.7 (K/uL)   Basophils Relative 0  0 - 1 (%)   Basophils Absolute 0.0  0.0 - 0.1 (K/uL)   Smear Review MORPHOLOGY UNREMARKABLE    LIPASE, BLOOD      Component Value Range   Lipase 115  23 - 300 (U/L)   Dg Thoracic Spine 2 View  12/31/2011  *RADIOLOGY REPORT*  Clinical Data: Lifting injury.  Back pain.  THORACIC SPINE - 2 VIEW  Comparison: None.  Findings: No evidence of acute fracture or subluxation. Intervertebral disc spaces are maintained.  No focal lytic or sclerotic bone lesions are identified.  Mild thoracic dextroscoliosis noted.  IMPRESSION:  1.  No acute findings. 2.  Mild thoracic dextroscoliosis.  Original Report Authenticated By: Danae Orleans, M.D.      Thoracic strain    MDM  Patient with mid back pain s/p lifting a patient - no neurological findings on examination - noted on x-ray with mild dextroscoliosis - patient made aware  and this is not likely to be the cause of her pain.  Will plan rest, pain medication and muscle relaxers - she will follow up with PCP if any thing further occurs.        Izola Price [place], [place] 12/31/11 1831

## 2011-12-31 NOTE — ED Provider Notes (Signed)
Medical screening examination/treatment/procedure(s) were performed by non-physician practitioner and as supervising physician I was immediately available for consultation/collaboration.   Rolan Bucco, MD 12/31/11 2308

## 2011-12-31 NOTE — ED Notes (Signed)
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 Noted Sharp pain lower thoracic region. Applied heat, took ibuprofen 800mg .  Pain remains localized.

## 2012-01-20 ENCOUNTER — Other Ambulatory Visit (HOSPITAL_COMMUNITY)
Admission: RE | Admit: 2012-01-20 | Discharge: 2012-01-20 | Disposition: A | Discharge status: Home / Self Care | Payer: BC Managed Care – PPO | Source: Ambulatory Visit | Attending: Family Medicine | Admitting: Family Medicine

## 2012-01-20 ENCOUNTER — Other Ambulatory Visit: Payer: Self-pay | Admitting: Family Medicine

## 2012-01-20 DIAGNOSIS — Z01419 Encounter for gynecological examination (general) (routine) without abnormal findings: Secondary | ICD-10-CM | POA: Insufficient documentation

## 2012-02-13 ENCOUNTER — Ambulatory Visit (INDEPENDENT_AMBULATORY_CARE_PROVIDER_SITE_OTHER): Payer: BC Managed Care – PPO | Admitting: Family Medicine

## 2012-02-13 VITALS — BP 118/82 | HR 87 | Temp 98.4°F | Resp 18 | Ht 64.0 in | Wt 144.0 lb

## 2012-02-13 DIAGNOSIS — Z2839 Other underimmunization status: Secondary | ICD-10-CM

## 2012-02-13 DIAGNOSIS — Z283 Underimmunization status: Secondary | ICD-10-CM

## 2012-02-13 DIAGNOSIS — Z111 Encounter for screening for respiratory tuberculosis: Secondary | ICD-10-CM

## 2012-02-13 DIAGNOSIS — Z0289 Encounter for other administrative examinations: Secondary | ICD-10-CM

## 2012-02-13 DIAGNOSIS — Z23 Encounter for immunization: Secondary | ICD-10-CM

## 2012-02-13 NOTE — Progress Notes (Signed)
  Subjective:    Patient ID: Sharon Hartman, female    DOB: 1981/04/29, 31 y.o.   MRN: [id number]  HPI 31 yo female here for annual exam.  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  Does not need pap - had several weeks ago. Healthy.  Does take adderall for ADHD.  Needs TB test and TDap updated today.     Review of Systems    Negative except as per HPI  Objective:   Physical Exam  Constitutional: Vital signs are normal. She appears well-developed and well-nourished.  HENT:  Head: Normocephalic.  Right Ear: Hearing, tympanic membrane, external ear and ear canal normal.  Left Ear: Hearing, tympanic membrane, external ear and ear canal normal.  Nose: Nose normal.  Eyes: Conjunctivae, EOM and lids are normal. Pupils are equal, round, and reactive to light.  Neck: No mass and no thyromegaly present.  Cardiovascular: Normal rate, regular rhythm, normal heart sounds, intact distal pulses and normal pulses.   Pulmonary/Chest: Effort normal and breath sounds normal.  Abdominal: Soft. Normal appearance and bowel sounds are normal. She exhibits no distension. There is no hepatosplenomegaly. There is no tenderness. There is no CVA tenderness.  Musculoskeletal: Normal range of motion.  Lymphadenopathy:    She has no cervical adenopathy.    She has no axillary adenopathy.  Neurological: She is alert.          Assessment & Plan:  Normal exam.  Immunizations reviewed.  Given TDaP and TB test today.

## 2012-02-16 ENCOUNTER — Encounter (INDEPENDENT_AMBULATORY_CARE_PROVIDER_SITE_OTHER): Payer: BC Managed Care – PPO

## 2012-02-16 DIAGNOSIS — Z111 Encounter for screening for respiratory tuberculosis: Secondary | ICD-10-CM

## 2012-02-16 LAB — TB SKIN TEST: TB Skin Test: NEGATIVE mm

## 2012-11-25 ENCOUNTER — Encounter (HOSPITAL_COMMUNITY): Payer: Self-pay

## 2012-11-25 ENCOUNTER — Emergency Department (HOSPITAL_COMMUNITY)
Admission: EM | Admit: 2012-11-25 | Discharge: 2012-11-25 | Disposition: A | Discharge status: Home / Self Care | Payer: Managed Care, Other (non HMO) | Attending: Emergency Medicine | Admitting: Emergency Medicine

## 2012-11-25 ENCOUNTER — Emergency Department (HOSPITAL_COMMUNITY): Payer: Managed Care, Other (non HMO)

## 2012-11-25 DIAGNOSIS — F430 Acute stress reaction: Secondary | ICD-10-CM | POA: Insufficient documentation

## 2012-11-25 DIAGNOSIS — R51 Headache: Secondary | ICD-10-CM | POA: Insufficient documentation

## 2012-11-25 DIAGNOSIS — J329 Chronic sinusitis, unspecified: Secondary | ICD-10-CM | POA: Insufficient documentation

## 2012-11-25 DIAGNOSIS — Z87891 Personal history of nicotine dependence: Secondary | ICD-10-CM | POA: Insufficient documentation

## 2012-11-25 DIAGNOSIS — Z79899 Other long term (current) drug therapy: Secondary | ICD-10-CM | POA: Insufficient documentation

## 2012-11-25 MED ORDER — METOCLOPRAMIDE HCL 5 MG/ML IJ SOLN
10.0000 mg | Freq: Once | INTRAMUSCULAR | Status: AC
  Administered 2012-11-25: 10 mg via INTRAVENOUS
  Filled 2012-11-25: qty 2

## 2012-11-25 MED ORDER — KETOROLAC TROMETHAMINE 30 MG/ML IJ SOLN
30.0000 mg | Freq: Once | INTRAMUSCULAR | Status: AC
  Administered 2012-11-25: 30 mg via INTRAVENOUS
  Filled 2012-11-25: qty 1

## 2012-11-25 MED ORDER — KETOROLAC TROMETHAMINE 30 MG/ML IJ SOLN: 30.0000 mg | Freq: Once | INTRAMUSCULAR | Status: DC

## 2012-11-25 MED ORDER — DIPHENHYDRAMINE HCL 50 MG/ML IJ SOLN
25.0000 mg | Freq: Once | INTRAMUSCULAR | Status: AC
  Administered 2012-11-25: 25 mg via INTRAVENOUS
  Filled 2012-11-25: qty 1

## 2012-11-25 MED ORDER — SODIUM CHLORIDE 0.9 % IV SOLN
Freq: Once | INTRAVENOUS | Status: AC
  Administered 2012-11-25: 09:00:00 via INTRAVENOUS

## 2012-11-25 MED ORDER — AMOXICILLIN 500 MG PO CAPS: 500.0000 mg | ORAL_CAPSULE | Freq: Three times a day (TID) | ORAL | Status: DC

## 2012-11-25 NOTE — ED Notes (Signed)
Voiced understanding of instructions given 

## 2012-11-25 NOTE — ED Provider Notes (Signed)
History     CSN: [id number]  Arrival date & time 11/25/12  0806   First MD Initiated Contact with Patient 11/25/12 [phone]      Chief Complaint  Patient presents with  . Headache    (Consider location/radiation/quality/duration/timing/severity/associated sxs/prior treatment) HPI Sharon Hartman is a 32 y.o. female who presents with a headache onset yesterday afternoon. States onset fast but not sudden "thunderclap." States pain constant since then but worsens and improves. States she measured her blood pressure and it was 200/100s, states took clonidine that is her friends. States it lowered her pressure and improved her headache. States also took an klonopin this morning. States was able to work entire night shift at work. Came in here because pain was not improving. States pain is bilateral at present, radiating into her neck. States neck stiff.  Denies fever, chills, malaise. Denies focal neuro deficits.  History reviewed. No pertinent past medical history.  Past Surgical History  Procedure Laterality Date  . Tonsillectomy      No family history on file.  History  Substance Use Topics  . Smoking status: Former Games developer  . Smokeless tobacco: Never Used  . Alcohol Use: 0.6 oz/week    1 Glasses of wine per week     Comment: socially    OB History   Grav Para Term Preterm Abortions TAB SAB Ect Mult Living                  Review of Systems  Constitutional: Negative for fever and chills.  Respiratory: Negative.   Cardiovascular: Negative.     Allergies  Prednisone  Home Medications   Current Outpatient Rx  Name  Route  Sig  Dispense  Refill  . amphetamine-dextroamphetamine (ADDERALL) 10 MG tablet   Oral   Take 10 mg by mouth 2 (two) times daily.         Marland Kitchen FLUoxetine (PROZAC) 20 MG tablet   Oral   Take 20 mg by mouth daily.         Marland Kitchen levonorgestrel-ethinyl estradiol (AVIANE,ALESSE,LESSINA) 0.1-20 MG-MCG tablet   Oral   Take 1 tablet by mouth daily.            BP 134/67  Pulse 76  Temp(Src) 98 F (36.7 C) (Oral)  SpO2 100%  LMP 10/15/2012  Physical Exam  Nursing note and vitals reviewed. Constitutional: She is oriented to person, place, and time. She appears well-developed and well-nourished.  HENT:  Head: Normocephalic and atraumatic.  Right Ear: External ear normal.  Left Ear: External ear normal.  Nose: Nose normal.  Mouth/Throat: Oropharynx is clear and moist.  TMs normal bilaterally  Eyes: EOM are normal. Pupils are equal, round, and reactive to light.  Neck: Normal range of motion. Neck supple.  Pain with ROM in all directions  Cardiovascular: Normal rate, regular rhythm and normal heart sounds.   Pulmonary/Chest: Effort normal and breath sounds normal. No respiratory distress. She has no wheezes. She has no rales.  Abdominal: Soft.  Musculoskeletal: She exhibits no edema.  Neurological: She is alert and oriented to person, place, and time. No cranial nerve deficit. Coordination normal.  5/5 and equal upper and lower extremity strength bilaterally. Equal grip strength bilaterally. Normal finger to nose and heel to shin. No pronator drift  Skin: Skin is warm and dry.  Psychiatric: She has a normal mood and affect. Her behavior is normal.    ED Course  Procedures (including critical care time)   Ct Head Wo  Contrast  11/25/2012  *RADIOLOGY REPORT*  Clinical Data: Headache.  CT HEAD WITHOUT CONTRAST  Technique:  Contiguous axial images were obtained from the base of the skull through the vertex without contrast.  Comparison: 11/07/2008  Findings: No acute intracranial abnormality.  Specifically, no hemorrhage, hydrocephalus, mass lesion, acute infarction, or significant intracranial injury.  No acute calvarial abnormality.  Air-fluid level in the left maxillary sinus.  Remainder paranasal sinuses and mastoids are clear.  IMPRESSION: No acute intracranial abnormality.  Air-fluid level in the left maxillary sinus suggest acute  sinusitis.   Original Report Authenticated By: Charlett Nose, M.D.      1. Headache   2. Sinusitis       MDM  Pt with severe headache, onset yesterday. Pt has not had headaches like this in the pat. Doubt meningitis, she is afebrile, full rom of the neck. Discussed with Dr. Rubin Payor. Ordered CT scan which was negative other than left maxillary sinusitis. Pt is an Char[employer] fundraiser, discussed with her possibility of ordering LP. Pt states she is feeling better now. Under a lot of stress. Has not been sleeping. She elected not to proceed with LP. Discussed pros and cons. Pt's sister is by bedside. She will take pt home and keep an eye on her. If her condition worsens, she will return to ER. Pt's pain improved with IV reglan, benadryl, toradol.   Filed Vitals:   11/25/12 0816 11/25/12 1157  BP: 134/67 100/53  Pulse: 76 66  Temp: 98 F (36.7 C) 98 F (36.7 C)  TempSrc: Oral Oral  Resp:  17  SpO2: 100% 99%          Lottie Mussel, PA 11/25/12 1613

## 2012-11-25 NOTE — ED Notes (Addendum)
Patient reports that she has had a headache x 2 days. Patient is positive for photophobia. Patient denies having a histsory of migraines. Patient reports that she took clonidine that she does not have a prescription for.  Patient also c/o nausea, but no vomiting.

## 2012-11-28 NOTE — ED Provider Notes (Signed)
Medical screening examination/treatment/procedure(s) were conducted as a shared visit with non-physician practitioner(s) and myself.  I personally evaluated the patient during the encounter. Patient had a period negative CT. Doubt meningitis or bleed. Patient feels better after treatment will be discharged home. The risks and benefits of a lumbar puncture were discussed with her  Juliet Rude. Rubin Payor, MD 11/28/12 1101

## 2013-04-05 ENCOUNTER — Emergency Department (HOSPITAL_COMMUNITY): Payer: No Typology Code available for payment source

## 2013-04-05 ENCOUNTER — Emergency Department (HOSPITAL_COMMUNITY)
Admission: EM | Admit: 2013-04-05 | Discharge: 2013-04-05 | Disposition: A | Discharge status: Home / Self Care | Payer: No Typology Code available for payment source | Attending: Emergency Medicine | Admitting: Emergency Medicine

## 2013-04-05 DIAGNOSIS — S39012A Strain of muscle, fascia and tendon of lower back, initial encounter: Secondary | ICD-10-CM

## 2013-04-05 DIAGNOSIS — Y9389 Activity, other specified: Secondary | ICD-10-CM | POA: Insufficient documentation

## 2013-04-05 DIAGNOSIS — M5431 Sciatica, right side: Secondary | ICD-10-CM

## 2013-04-05 DIAGNOSIS — S335XXA Sprain of ligaments of lumbar spine, initial encounter: Secondary | ICD-10-CM | POA: Insufficient documentation

## 2013-04-05 DIAGNOSIS — Z79899 Other long term (current) drug therapy: Secondary | ICD-10-CM | POA: Insufficient documentation

## 2013-04-05 DIAGNOSIS — R209 Unspecified disturbances of skin sensation: Secondary | ICD-10-CM | POA: Insufficient documentation

## 2013-04-05 DIAGNOSIS — M545 Low back pain, unspecified: Secondary | ICD-10-CM

## 2013-04-05 DIAGNOSIS — IMO0002 Reserved for concepts with insufficient information to code with codable children: Secondary | ICD-10-CM | POA: Insufficient documentation

## 2013-04-05 DIAGNOSIS — W108XXA Fall (on) (from) other stairs and steps, initial encounter: Secondary | ICD-10-CM | POA: Insufficient documentation

## 2013-04-05 DIAGNOSIS — M5430 Sciatica, unspecified side: Secondary | ICD-10-CM | POA: Insufficient documentation

## 2013-04-05 DIAGNOSIS — Z87891 Personal history of nicotine dependence: Secondary | ICD-10-CM | POA: Insufficient documentation

## 2013-04-05 DIAGNOSIS — Y9289 Other specified places as the place of occurrence of the external cause: Secondary | ICD-10-CM | POA: Insufficient documentation

## 2013-04-05 MED ORDER — HYDROCODONE-ACETAMINOPHEN 5-325 MG PO TABS
2.0000 | ORAL_TABLET | Freq: Once | ORAL | Status: AC
  Administered 2013-04-05: 2 via ORAL
  Filled 2013-04-05: qty 2

## 2013-04-05 MED ORDER — HYDROCODONE-ACETAMINOPHEN 5-325 MG PO TABS: 1.0000 | ORAL_TABLET | Freq: Four times a day (QID) | ORAL | Status: DC | PRN

## 2013-04-05 MED ORDER — DIAZEPAM 5 MG PO TABS
10.0000 mg | ORAL_TABLET | Freq: Once | ORAL | Status: AC
  Administered 2013-04-05: 10 mg via ORAL
  Filled 2013-04-05: qty 2

## 2013-04-05 MED ORDER — NAPROXEN 500 MG PO TABS: 500.0000 mg | ORAL_TABLET | Freq: Two times a day (BID) | ORAL | Status: DC | PRN

## 2013-04-05 MED ORDER — METHOCARBAMOL 750 MG PO TABS: 750.0000 mg | ORAL_TABLET | Freq: Four times a day (QID) | ORAL | Status: DC | PRN

## 2013-04-05 NOTE — ED Notes (Signed)
Pt fell on Wednesday and injured her back. Pt states she is having lower back pain and had some tingling down her R leg today. Pt also states she didn't have feeling to her R foot earlier today. States sensation is back now, but comes and goes to R foot. Pt ambulatory to exam room with steady gait. Pt arrives with companion.

## 2013-04-05 NOTE — ED Provider Notes (Signed)
Medical screening examination/treatment/procedure(s) were performed by non-physician practitioner and as supervising physician I was immediately available for consultation/collaboration.   Milfred Krammes, MD 04/05/13 2308 

## 2013-04-05 NOTE — ED Provider Notes (Signed)
History  This chart was scribed for Dierdre Forth- PA by Manuela Schwartz, ED scribe. This patient was seen in room WTR9/WTR9 and the patient's care was started at 1924.  CSN: [id number] Arrival date & time 04/05/13  1924  First MD Initiated Contact with Patient 04/05/13 2033     Chief Complaint  Patient presents with  . Back Pain  . Leg Numbness    Patient is a 32 y.o. female presenting with back pain. The history is provided by the patient and medical records. No language interpreter was used.  Back Pain Location:  Lumbar spine Quality:  Aching Radiates to:  R thigh Pain severity:  Mild Pain is:  Same all the time Onset quality:  Gradual Duration:  3 days Timing:  Constant Progression:  Unchanged Chronicity:  New Context: falling   Relieved by:  Nothing Worsened by:  Twisting and bending Ineffective treatments:  NSAIDs Associated symptoms: numbness (parasthesia of her right thigh/leg  ) and paresthesias   Associated symptoms: no abdominal pain, no bladder incontinence, no bowel incontinence, no chest pain, no dysuria, no fever and no headaches    HPI Comments: Sharon Hartman is a 32 y.o. female who presents to the Emergency Department complaining of tingling/numbness to her right thigh and radiating down her right leg which has since resolved but occurred while at rest at home today. She attributes this episode to falling down the stairs several days ago and now also w/ lower back pain. She states minimal relief with ibuprofen. She states pain is worsened with turning/twisting movements. She denies hitting her head, LOC, or neck pain. She denies any other injuries and regular medicines are prozac, birth control, and adderall.    No past medical history on file. Past Surgical History  Procedure Laterality Date  . Tonsillectomy     No family history on file. History  Substance Use Topics  . Smoking status: Former Games developer  . Smokeless tobacco: Never Used  . Alcohol Use: 0.6  oz/week    1 Glasses of wine per week     Comment: socially   OB History   Grav Para Term Preterm Abortions TAB SAB Ect Mult Living                 Review of Systems  Constitutional: Negative for fever, diaphoresis, appetite change, fatigue and unexpected weight change.  HENT: Negative for mouth sores and neck stiffness.   Eyes: Negative for visual disturbance.  Respiratory: Negative for cough, chest tightness, shortness of breath and wheezing.   Cardiovascular: Negative for chest pain.  Gastrointestinal: Negative for nausea, vomiting, abdominal pain, diarrhea, constipation and bowel incontinence.  Endocrine: Negative for polydipsia, polyphagia and polyuria.  Genitourinary: Negative for bladder incontinence, dysuria, urgency, frequency and hematuria.  Musculoskeletal: Positive for back pain (lower back pain).  Skin: Negative for rash.  Allergic/Immunologic: Negative for immunocompromised state.  Neurological: Positive for numbness (parasthesia of her right thigh/leg  ) and paresthesias. Negative for syncope, light-headedness and headaches.  Hematological: Does not bruise/bleed easily.  Psychiatric/Behavioral: Negative for sleep disturbance. The patient is not nervous/anxious.   All other systems reviewed and are negative.   A complete 10 system review of systems was obtained and all systems are negative except as noted in the HPI and PMH.   Allergies  Prednisone  Home Medications   Current Outpatient Rx  Name  Route  Sig  Dispense  Refill  . amphetamine-dextroamphetamine (ADDERALL XR) 20 MG 24 hr capsule   Oral  Take 20 mg by mouth 2 (two) times daily.         Marland Kitchen FLUoxetine (PROZAC) 20 MG tablet   Oral   Take 40 mg by mouth daily.          Marland Kitchen levonorgestrel-ethinyl estradiol (AVIANE,ALESSE,LESSINA) 0.1-20 MG-MCG tablet   Oral   Take 1 tablet by mouth daily.         Marland Kitchen HYDROcodone-acetaminophen (NORCO/VICODIN) 5-325 MG per tablet   Oral   Take 1 tablet by mouth  every 6 (six) hours as needed for pain (Take 1 - 2 tablets every 4 - 6 hours.).   20 tablet   0   . methocarbamol (ROBAXIN) 750 MG tablet   Oral   Take 1 tablet (750 mg total) by mouth 4 (four) times daily as needed (Take 1 tablet every 6 hours as needed for muscle spasms.).   20 tablet   0   . naproxen (NAPROSYN) 500 MG tablet   Oral   Take 1 tablet (500 mg total) by mouth 2 (two) times daily as needed.   30 tablet   0    Triage Vitals: BP 129/86  Pulse 78  Temp(Src) 98.8 F (37.1 C) (Oral)  Resp 16  SpO2 100% Physical Exam  Nursing note and vitals reviewed. Constitutional: She appears well-developed and well-nourished. No distress.  HENT:  Head: Normocephalic and atraumatic.  Mouth/Throat: Oropharynx is clear and moist. No oropharyngeal exudate.  Eyes: Conjunctivae are normal.  Neck: Normal range of motion. Neck supple.  Full ROM without pain  Cardiovascular: Normal rate, regular rhythm, normal heart sounds and intact distal pulses.   No murmur heard. Pulmonary/Chest: Effort normal and breath sounds normal. No respiratory distress. She has no wheezes.  Abdominal: Soft. She exhibits no distension. There is no tenderness.  Musculoskeletal:       Thoracic back: She exhibits tenderness. She exhibits normal range of motion, no bony tenderness, no swelling, no edema, no deformity, no laceration, no pain and no spasm.       Lumbar back: She exhibits tenderness, pain and spasm. She exhibits no bony tenderness.  Full range of motion of the T-spine and L-spine No tenderness to palpation of the spinous processes of the T-spine or L-spine Mild tenderness to palpation of the paraspinous muscles of the T-spine and L-spine  Lymphadenopathy:    She has no cervical adenopathy.  Neurological: She is alert. She has normal reflexes. GCS eye subscore is 4. GCS verbal subscore is 5. GCS motor subscore is 6.  Reflex Scores:      Tricep reflexes are 2+ on the right side and 2+ on the left  side.      Bicep reflexes are 2+ on the right side and 2+ on the left side.      Brachioradialis reflexes are 2+ on the right side and 2+ on the left side.      Patellar reflexes are 2+ on the right side and 2+ on the left side.      Achilles reflexes are 2+ on the right side and 2+ on the left side. Speech is clear and goal oriented, follows commands Normal strength in upper and lower extremities bilaterally including dorsiflexion and plantar flexion, strong and equal grip strength Sensation normal to light and sharp touch Moves extremities without ataxia, coordination intact Normal gait Normal balance   Skin: Skin is warm and dry. No rash noted. She is not diaphoretic. No erythema.  Psychiatric: She has a normal mood and affect.  ED Course  Procedures (including critical care time) DIAGNOSTIC STUDIES: Oxygen Saturation is 100% on room air, normal by my interpretation.    COORDINATION OF CARE: At 845 PM Discussed treatment plan with patient which includes muscle relaxer, and pain medicine. Patient agrees.   Labs Reviewed - No data to display Dg Lumbar Spine Complete  04/05/2013   *RADIOLOGY REPORT*  Clinical Data: Fall down stairs 3 days ago with low back pain.  LUMBAR SPINE - COMPLETE 4+ VIEW  Comparison: None  Findings: Five lumbar type vertebral bodies.  Sacroiliac joints are symmetric.  Maintenance of vertebral body height and alignment. Mild nonspecific straightening of the expected lordosis. Intervertebral disc heights are maintained.  IMPRESSION: No acute osseous abnormality.   Original Report Authenticated By: Jeronimo Greaves, M.D.   1. Low back pain   2. Sciatica, right [724.3]   3. Strain of lumbar region, initial encounter [847.2]     MDM    MDM Number of Diagnoses or Management Options Low back pain:  Sciatica, right (724.3):  Strain of lumbar region, initial encounter (847.2):    Soni Kegel Kunert presents with back pain.  Patient with back pain and sciatica  symptoms.  X-ray without acute fracture.  I personally reviewed the imaging tests through PACS system.  I reviewed available ER/hospitalization records through the EMR.  No neurological deficits and normal neuro exam.  Patient can walk but states is painful.  No loss of bowel or bladder control.  No concern for cauda equina.  No fever, night sweats, weight loss, h/o cancer, IVDU.  RICE protocol and pain medicine indicated and discussed with patient. I have also discussed reasons to return immediately to the ER.  Patient expresses understanding and agrees with plan.  I personally performed the services described in this documentation, which was scribed in my presence. The recorded information has been reviewed and is accurate.    Dahlia Client Amal Saiki, PA-C 04/05/13 2141  Dierdre Forth, PA-C 04/05/13 2142

## 2013-07-17 ENCOUNTER — Other Ambulatory Visit: Payer: Self-pay | Admitting: Family Medicine

## 2013-07-17 ENCOUNTER — Other Ambulatory Visit (HOSPITAL_COMMUNITY)
Admission: RE | Admit: 2013-07-17 | Discharge: 2013-07-17 | Disposition: A | Discharge status: Home / Self Care | Payer: No Typology Code available for payment source | Source: Ambulatory Visit | Attending: Family Medicine | Admitting: Family Medicine

## 2013-07-17 DIAGNOSIS — Z01419 Encounter for gynecological examination (general) (routine) without abnormal findings: Secondary | ICD-10-CM | POA: Insufficient documentation

## 2013-10-05 ENCOUNTER — Inpatient Hospital Stay (HOSPITAL_COMMUNITY): Payer: No Typology Code available for payment source

## 2013-10-05 ENCOUNTER — Encounter (HOSPITAL_COMMUNITY): Payer: Self-pay

## 2013-10-05 ENCOUNTER — Inpatient Hospital Stay (HOSPITAL_COMMUNITY)
Admission: AD | Admit: 2013-10-05 | Discharge: 2013-10-05 | Disposition: A | Discharge status: Home / Self Care | Payer: No Typology Code available for payment source | Source: Ambulatory Visit | Attending: Obstetrics & Gynecology | Admitting: Obstetrics & Gynecology

## 2013-10-05 DIAGNOSIS — R109 Unspecified abdominal pain: Secondary | ICD-10-CM | POA: Insufficient documentation

## 2013-10-05 DIAGNOSIS — O209 Hemorrhage in early pregnancy, unspecified: Secondary | ICD-10-CM | POA: Insufficient documentation

## 2013-10-05 DIAGNOSIS — Z87891 Personal history of nicotine dependence: Secondary | ICD-10-CM | POA: Insufficient documentation

## 2013-10-05 HISTORY — DX: Other specified health status: Z78.9

## 2013-10-05 LAB — WET PREP, GENITAL
Clue Cells Wet Prep HPF POC: NONE SEEN
Trich, Wet Prep: NONE SEEN
Yeast Wet Prep HPF POC: NONE SEEN

## 2013-10-05 LAB — POCT PREGNANCY, URINE: Preg Test, Ur: POSITIVE — AB

## 2013-10-05 LAB — CBC
HCT: 36.1 % (ref 36.0–46.0)
Hemoglobin: 12.5 g/dL (ref 12.0–15.0)
MCH: 31.2 pg (ref 26.0–34.0)
MCHC: 34.6 g/dL (ref 30.0–36.0)
MCV: 90 fL (ref 78.0–100.0)
Platelets: 325 10*3/uL (ref 150–400)
RBC: 4.01 MIL/uL (ref 3.87–5.11)
RDW: 12.4 % (ref 11.5–15.5)
WBC: 8.2 10*3/uL (ref 4.0–10.5)

## 2013-10-05 LAB — URINALYSIS, ROUTINE W REFLEX MICROSCOPIC
Bilirubin Urine: NEGATIVE
Glucose, UA: NEGATIVE mg/dL
Hgb urine dipstick: NEGATIVE
Ketones, ur: NEGATIVE mg/dL
Leukocytes, UA: NEGATIVE
Nitrite: NEGATIVE
Protein, ur: NEGATIVE mg/dL
Specific Gravity, Urine: 1.02 (ref 1.005–1.030)
Urobilinogen, UA: 0.2 mg/dL (ref 0.0–1.0)
pH: 6 (ref 5.0–8.0)

## 2013-10-05 LAB — OB RESULTS CONSOLE GC/CHLAMYDIA
Chlamydia: NEGATIVE
Gonorrhea: NEGATIVE

## 2013-10-05 LAB — HCG, QUANTITATIVE, PREGNANCY: hCG, Beta Chain, Quant, S: 797 m[IU]/mL — ABNORMAL HIGH (ref <5)

## 2013-10-05 NOTE — MAU Provider Note (Signed)
History     CSN: [id number]  Arrival date and time: 10/05/13 1536   None     Chief Complaint  Patient presents with  . Abdominal Pain  . Vaginal Bleeding   HPI This is a 32 y.o. female at [redacted]w[redacted]d by LMP who presents with c/o pain in lower abdomen since last night. It got worse today. Had some pink spotting also, No fever   RN Note: Pt states here for severe cramping across lower abdomen that began last pm and worsened today. Pain is intermittent. Bleeding began this am, pale pink in color, now spotting only. No clots seen.       OB History   Grav Para Term Preterm Abortions TAB SAB Ect Mult Living   2    1 1     0      Past Medical History  Diagnosis Date  . Medical history non-contributory     Past Surgical History  Procedure Laterality Date  . Tonsillectomy    . Dilation and curettage of uterus      History reviewed. No pertinent family history.  History  Substance Use Topics  . Smoking status: Former Games developer  . Smokeless tobacco: Never Used  . Alcohol Use: 0.6 oz/week    1 Glasses of wine per week     Comment: socially    Allergies:  Allergies  Allergen Reactions  . Prednisone Other (See Comments)    Sharp abdominal pain    Prescriptions prior to admission  Medication Sig Dispense Refill  . FLUoxetine (PROZAC) 40 MG capsule Take 40 mg by mouth daily.      . Prenatal Vit-Fe Fumarate-FA (PRENATAL MULTIVITAMIN) TABS tablet Take 1 tablet by mouth every morning.        Review of Systems  Constitutional: Negative for fever, chills and malaise/fatigue.  Gastrointestinal: Positive for abdominal pain. Negative for nausea, vomiting, diarrhea and constipation.  Genitourinary:       Spotting   Neurological: Negative for dizziness and headaches.   Physical Exam   Blood pressure 111/71, pulse 81, temperature 98.7 F (37.1 C), temperature source Oral, resp. rate 18, height 5\' 6"  (1.676 m), weight 72.122 kg (159 lb), last menstrual period  08/03/2013.  Physical Exam  Constitutional: She is oriented to person, place, and time. She appears well-developed and well-nourished. No distress.  Cardiovascular: Normal rate.   Respiratory: Effort normal.  GI: Soft. She exhibits no distension and no mass. There is tenderness (slight tenderness over lower abdomen). There is no rebound and no guarding.  Genitourinary: Uterus normal. Vaginal discharge (small pink/red discharge) found.  Musculoskeletal: Normal range of motion.  Neurological: She is alert and oriented to person, place, and time.  Skin: Skin is warm and dry.  Psychiatric: She has a normal mood and affect.    MAU Course  Procedures  MDM Results for orders placed during the hospital encounter of 10/05/13 (from the past 24 hour(s))  URINALYSIS, ROUTINE W REFLEX MICROSCOPIC     Status: None   Collection Time    10/05/13  4:00 PM      Result Value Range   Color, Urine YELLOW  YELLOW   APPearance CLEAR  CLEAR   Specific Gravity, Urine 1.020  1.005 - 1.030   pH 6.0  5.0 - 8.0   Glucose, UA NEGATIVE  NEGATIVE mg/dL   Hgb urine dipstick NEGATIVE  NEGATIVE   Bilirubin Urine NEGATIVE  NEGATIVE   Ketones, ur NEGATIVE  NEGATIVE mg/dL   Protein, ur NEGATIVE  NEGATIVE mg/dL   Urobilinogen, UA 0.2  0.0 - 1.0 mg/dL   Nitrite NEGATIVE  NEGATIVE   Leukocytes, UA NEGATIVE  NEGATIVE  POCT PREGNANCY, URINE     Status: Abnormal   Collection Time    10/05/13  4:12 PM      Result Value Range   Preg Test, Ur POSITIVE (*) NEGATIVE  HCG, QUANTITATIVE, PREGNANCY     Status: Abnormal   Collection Time    10/05/13  5:03 PM      Result Value Range   hCG, Beta Chain, Quant, S 797 (*) <5 mIU/mL  CBC     Status: None   Collection Time    10/05/13  5:05 PM      Result Value Range   WBC 8.2  4.0 - 10.5 K/uL   RBC 4.01  3.87 - 5.11 MIL/uL   Hemoglobin 12.5  12.0 - 15.0 g/dL   HCT 21.3  08.6 - 57.8 %   MCV 90.0  78.0 - 100.0 fL   MCH 31.2  26.0 - 34.0 pg   MCHC 34.6  30.0 - 36.0 g/dL    RDW 46.9  62.9 - 52.8 %   Platelets 325  150 - 400 K/uL  WET PREP, GENITAL     Status: Abnormal   Collection Time    10/05/13  5:14 PM      Result Value Range   Yeast Wet Prep HPF POC NONE SEEN  NONE SEEN   Trich, Wet Prep NONE SEEN  NONE SEEN   Clue Cells Wet Prep HPF POC NONE SEEN  NONE SEEN   WBC, Wet Prep HPF POC MANY (*) NONE SEEN   US Ob Comp Less 14 Wks  10/05/2013   CLINICAL DATA:  Pain and spotting, early pregnancy, uncertain dates, quantitative beta HCG 797   EXAM: OBSTETRIC <14 WK [place] AND TRANSVAGINAL OB US  TECHNIQUE: Both transabdominal and transvaginal ultrasound examinations were performed for complete evaluation of the gestation as well as the maternal uterus, adnexal regions, and pelvic cul-de-sac. Transvaginal technique was performed to assess early pregnancy.   COMPARISON:  Non  FINDINGS: Intrauterine gestational sac: Probable tiny gestational sac  Yolk sac:  Absent  Embryo:  Absent  Cardiac Activity: N/A  Heart Rate:  N/A bpm  MSD:  2.1  mm   4 w   5 d        [place] EDC: 06/09/2012  Maternal uterus/adnexae:  No additional endometrial fluid.  Right ovary normal size and morphology, 2.6 x 3.3 x 1.2 cm.  Left ovary measures 2.2 x 3.5 x 2.3 cm and contains a small corpus luteal cyst.  Tiny left para ovarian cyst 10 x 8 x 6 mm.  No additional adnexal masses or free pelvic fluid.    IMPRESSION: Tiny probable gestational sac within uterus with mean sac diameter corresponding to 4 weeks 5 days EGA.  Fetal pole not yet visualized to establish viability.   If clinically indicated, viability can be established by followup ultrasound in 14 days.  No other significant findings.     Electronically Signed   By: Ulyses Southward M.D.   On: 10/05/2013 18:35    Assessment and Plan  A:  SIUP at [redacted]w[redacted]d by LMP, 4.5wks by [place]      Bleeding early pregnancy      SAB vs early gestation      Cannot rule out ectopic  P:  Discussed findings      Repeat Quant in 2 days  then if rises appropriately, repeat  US in a week to 10 days      Ectopic precautions  [facility] 10/05/2013, 4:58 PM

## 2013-10-05 NOTE — MAU Note (Signed)
Pt states here for severe cramping across lower abdomen that began last pm and worsened today. Pain is intermittent. Bleeding began this am, pale pink in color, now spotting only. No clots seen.

## 2013-10-07 LAB — GC/CHLAMYDIA PROBE AMP
CT Probe RNA: NEGATIVE
GC Probe RNA: NEGATIVE

## 2013-10-09 NOTE — MAU Provider Note (Signed)
Attestation of Attending Supervision of Advanced Practitioner (CNM/NP): Evaluation and management procedures were performed by the Advanced Practitioner under my supervision and collaboration. I have reviewed the Advanced Practitioner's note and chart, and I agree with the management and plan.  LEGGETT,KELLY H. 9:34 AM   

## 2013-10-10 NOTE — L&D Delivery Note (Signed)
Delivery Note  First Stage: Labor onset: 2201 Augmentation: Pitocin, AROM Analgesia /Anesthesia intrapartum: IV Fentanyl, epidural AROM at 2201-clear, mod  Second Stage: Complete dilation at 0338 Onset of pushing at 0338 FHR second stage 150 bpm, moderate variability, variables with pushing  Delivery of a viable female at 64 by CNM in OA position No nuchal cord Cord double clamped after cessation of pulsation, cut by FOB Cord blood sample collected   Collection of cord blood donation n/a Arterial cord blood sample n/a  Third Stage: Placenta delivered via Delena Bali intact with 3 VC @ [phone] Succenturiate lobe noted to placenta Placenta disposition: storage x24 hrs then home with pt Uterine tone firm / bleeding small  Bilateral labial lacerations and 2nd degree perineal laceration identified  Anesthesia for repair: local, regional Repaired with 3-0 Vicryl rapide, 4-0 Vicryl Est. Blood Loss (mL): 300 10 mm nevus superior to right gluteal fold excised-hemostasis achieved with 4-0 vicryl figure of 8 stitch  Complications: none  Mom to postpartum.  Baby to Couplet care / Skin to Skin.  Newborn: Birth Weight: 8-15  Apgar Scores: 8/9 Feeding planned: breast  Julianne Handler, N MSN, CNM 06/18/2014, 5:29 AM

## 2013-10-30 ENCOUNTER — Inpatient Hospital Stay (HOSPITAL_COMMUNITY)
Admission: AD | Admit: 2013-10-30 | Discharge: 2013-10-30 | Disposition: A | Discharge status: Home / Self Care | Payer: No Typology Code available for payment source | Source: Ambulatory Visit | Attending: Obstetrics and Gynecology | Admitting: Obstetrics and Gynecology

## 2013-10-30 ENCOUNTER — Encounter (HOSPITAL_COMMUNITY): Payer: Self-pay | Admitting: *Deleted

## 2013-10-30 DIAGNOSIS — K5289 Other specified noninfective gastroenteritis and colitis: Secondary | ICD-10-CM

## 2013-10-30 DIAGNOSIS — K529 Noninfective gastroenteritis and colitis, unspecified: Secondary | ICD-10-CM

## 2013-10-30 DIAGNOSIS — O210 Mild hyperemesis gravidarum: Secondary | ICD-10-CM | POA: Insufficient documentation

## 2013-10-30 DIAGNOSIS — O99891 Other specified diseases and conditions complicating pregnancy: Secondary | ICD-10-CM | POA: Insufficient documentation

## 2013-10-30 DIAGNOSIS — A088 Other specified intestinal infections: Secondary | ICD-10-CM | POA: Insufficient documentation

## 2013-10-30 DIAGNOSIS — R109 Unspecified abdominal pain: Secondary | ICD-10-CM | POA: Insufficient documentation

## 2013-10-30 DIAGNOSIS — O9989 Other specified diseases and conditions complicating pregnancy, childbirth and the puerperium: Principal | ICD-10-CM

## 2013-10-30 LAB — URINALYSIS, ROUTINE W REFLEX MICROSCOPIC
Bilirubin Urine: NEGATIVE
Glucose, UA: NEGATIVE mg/dL
Hgb urine dipstick: NEGATIVE
Ketones, ur: NEGATIVE mg/dL
Leukocytes, UA: NEGATIVE
Nitrite: NEGATIVE
Protein, ur: NEGATIVE mg/dL
Specific Gravity, Urine: 1.01 (ref 1.005–1.030)
Urobilinogen, UA: 0.2 mg/dL (ref 0.0–1.0)
pH: 7.5 (ref 5.0–8.0)

## 2013-10-30 MED ORDER — ONDANSETRON 8 MG PO TBDP
8.0000 mg | ORAL_TABLET | Freq: Once | ORAL | Status: AC
  Administered 2013-10-30: 8 mg via ORAL
  Filled 2013-10-30: qty 1

## 2013-10-30 MED ORDER — ONDANSETRON HCL 4 MG PO TABS: 4.0000 mg | ORAL_TABLET | Freq: Four times a day (QID) | ORAL | Status: DC

## 2013-10-30 MED ORDER — PROMETHAZINE HCL 25 MG PO TABS: 25.0000 mg | ORAL_TABLET | Freq: Four times a day (QID) | ORAL | Status: DC | PRN

## 2013-10-30 NOTE — MAU Provider Note (Signed)
History     CSN: [id number]  Arrival date and time: 10/30/13 6734   First Provider Initiated Contact with Patient 10/30/13 [phone]      Chief Complaint  Patient presents with  . Emesis During Pregnancy   HPI [name] is a 33 y.o. G2P0010 at [redacted]w[redacted]d who presents to MAU today with complaint of N/V since last night. The patient denies N/V earlier in the pregnancy. She states that her husband was sick as well. She denies fever or vaginal bleeding. Has had mild cramping off and on x weeks. Patient has first appointment with [facility] for prenatal care tomorrow.   OB History   Grav Para Term Preterm Abortions TAB SAB Ect Mult Living   2    1 1     0      Past Medical History  Diagnosis Date  . Medical history non-contributory     Past Surgical History  Procedure Laterality Date  . Tonsillectomy    . Dilation and curettage of uterus      History reviewed. No pertinent family history.  History  Substance Use Topics  . Smoking status: Former Research scientist (life sciences)  . Smokeless tobacco: Never Used  . Alcohol Use: 0.6 oz/week    1 Glasses of wine per week     Comment: socially    Allergies:  Allergies  Allergen Reactions  . Prednisone Other (See Comments)    Sharp abdominal pain    Prescriptions prior to admission  Medication Sig Dispense Refill  . FLUoxetine (PROZAC) 40 MG capsule Take 40 mg by mouth See admin instructions. Every 2 days      . Prenatal Vit-Fe Fumarate-FA (PRENATAL MULTIVITAMIN) TABS tablet Take 1 tablet by mouth every morning.        Review of Systems  Constitutional: Positive for malaise/fatigue. Negative for fever.  Gastrointestinal: Positive for nausea, vomiting and abdominal pain. Negative for diarrhea and constipation.  Genitourinary: Negative for dysuria, urgency and frequency.       Neg - vaginal bleeding  Neurological: Positive for dizziness and weakness. Negative for loss of consciousness.   Physical Exam   Blood pressure 111/63, pulse 87, temperature  98.3 F (36.8 C), temperature source Oral, resp. rate 16, height 5\' 6"  (1.676 m), weight 162 lb (73.483 kg), last menstrual period 08/03/2013.  Physical Exam  Constitutional: She is oriented to person, place, and time. She appears well-developed and well-nourished. No distress.  HENT:  Head: Normocephalic and atraumatic.  Cardiovascular: Normal rate.   Respiratory: Effort normal.  GI: Soft. She exhibits no distension and no mass. Bowel sounds are decreased. There is tenderness (mild diffuse abdominal tenderness to palpation). There is no rebound and no guarding.  Neurological: She is alert and oriented to person, place, and time.  Skin: Skin is warm and dry. No erythema.  Psychiatric: She has a normal mood and affect.   Results for orders placed during the hospital encounter of 10/30/13 (from the past 24 hour(s))  URINALYSIS, ROUTINE W REFLEX MICROSCOPIC     Status: None   Collection Time    10/30/13  9:30 AM      Result Value Range   Color, Urine YELLOW  YELLOW   APPearance CLEAR  CLEAR   Specific Gravity, Urine 1.010  1.005 - 1.030   pH 7.5  5.0 - 8.0   Glucose, UA NEGATIVE  NEGATIVE mg/dL   Hgb urine dipstick NEGATIVE  NEGATIVE   Bilirubin Urine NEGATIVE  NEGATIVE   Ketones, ur NEGATIVE  NEGATIVE  mg/dL   Protein, ur NEGATIVE  NEGATIVE mg/dL   Urobilinogen, UA 0.2  0.0 - 1.0 mg/dL   Nitrite NEGATIVE  NEGATIVE   Leukocytes, UA NEGATIVE  NEGATIVE    MAU Course  Procedures None  MDM Discussed patient's lack of follow-up with Dr. Elly Modena. She will come to MAU to use bedside US to confirm IUP at this time.  Beside US shows IUP with cardiac activity Assessment and Plan  A: Viral gastroenteritis  P: Discharge home Rx for Phenergan and Zofran sent to patient's pharmacy Patient encouraged to increased PO hydration as tolerated Patient advised to advance diet as tolerated Patient encouraged to keep appointment with [facility] tomorrow to start prenatal care Patient may return to  MAU as needed or if her condition were to change or worsen  Farris Has, PA-C  10/30/2013, 10:46 AM

## 2013-10-30 NOTE — Discharge Instructions (Signed)
Diet  Frequent, runny stools (diarrhea) may be caused or worsened by food or drink. Diarrhea may be relieved by changing your diet. Since diarrhea can last up to 7 days, it is easy for you to lose too much fluid from the body and become dehydrated. Fluids that are lost need to be replaced. Along with a modified diet, make sure you drink enough fluids to keep your urine clear or pale yellow. DIET INSTRUCTIONS  Ensure adequate fluid intake (hydration): have 1 cup (8 oz) of fluid for each diarrhea episode. Avoid fluids that contain simple sugars or sports drinks, fruit juices, whole milk products, and sodas. Your urine should be clear or pale yellow if you are drinking enough fluids. Hydrate with an oral rehydration solution that you can purchase at pharmacies, retail stores, and online. You can prepare an oral rehydration solution at home by mixing the following ingredients together:    tsp table salt.   tsp baking soda.   tsp salt substitute containing potassium chloride.  1  tablespoons sugar.  1 L (34 oz) of water.  Certain foods and beverages may increase the speed at which food moves through the gastrointestinal (GI) tract. These foods and beverages should be avoided and include:  Caffeinated and alcoholic beverages.  High-fiber foods, such as raw fruits and vegetables, nuts, seeds, and whole grain breads and cereals.  Foods and beverages sweetened with sugar alcohols, such as xylitol, sorbitol, and mannitol.  Some foods may be well tolerated and may help thicken stool including:  Starchy foods, such as rice, toast, pasta, low-sugar cereal, oatmeal, grits, baked potatoes, crackers, and bagels.   Bananas.   Applesauce.  Add probiotic-rich foods to help increase healthy bacteria in the GI tract, such as yogurt and fermented milk products. RECOMMENDED FOODS AND BEVERAGES Starches Choose foods with less than 2 g of fiber per serving.  Recommended:  White, [place], and pita  breads, plain rolls, buns, bagels. Plain muffins, matzo. Soda, saltine, or graham crackers. Pretzels, melba toast, zwieback. Cooked cereals made with water: cornmeal, farina, cream cereals. Dry cereals: refined corn, wheat, rice. Potatoes prepared any way without skins, refined macaroni, spaghetti, noodles, refined rice.  Avoid:  Bread, rolls, or crackers made with whole wheat, multi-grains, rye, bran seeds, nuts, or coconut. Corn tortillas or taco shells. Cereals containing whole grains, multi-grains, bran, coconut, nuts, raisins. Cooked or dry oatmeal. Coarse wheat cereals, granola. Cereals advertised as "high-fiber." Potato skins. Whole grain pasta, wild or brown rice. Popcorn. Sweet potatoes, yams. Sweet rolls, doughnuts, waffles, pancakes, sweet breads. Vegetables  Recommended: Strained tomato and vegetable juices. Most well-cooked and canned vegetables without seeds. Fresh: Tender lettuce, cucumber without the skin, cabbage, spinach, bean sprouts.  Avoid: Fresh, cooked, or canned: Artichokes, baked beans, beet greens, broccoli, Brussels sprouts, corn, kale, legumes, peas, sweet potatoes. Cooked: Green or red cabbage, spinach. Avoid large servings of any vegetables because vegetables shrink when cooked, and they contain more fiber per serving than fresh vegetables. Fruit  Recommended: Cooked or canned: Apricots, applesauce, cantaloupe, cherries, fruit cocktail, grapefruit, grapes, kiwi, mandarin oranges, peaches, pears, plums, watermelon. Fresh: Apples without skin, ripe banana, grapes, cantaloupe, cherries, grapefruit, peaches, oranges, plums. Keep servings limited to  cup or 1 piece.  Avoid: Fresh: Apples with skin, apricots, mangoes, pears, raspberries, strawberries. Prune juice, stewed or dried prunes. Dried fruits, raisins, dates. Large servings of all fresh fruits. Protein  Recommended: Ground or well-cooked tender beef, ham, veal, lamb, pork, or poultry. Eggs. Fish, oysters, shrimp,  lobster, other  seafoods. Liver, organ meats.  Avoid: Tough, fibrous meats with gristle. Peanut butter, smooth or chunky. Cheese, nuts, seeds, legumes, dried peas, beans, lentils. Dairy  Recommended: Yogurt, lactose-free milk, kefir, drinkable yogurt, buttermilk, soy milk, or plain hard cheese.  Avoid: Milk, chocolate milk, beverages made with milk, such as milkshakes. Soups  Recommended: Bouillon, broth, or soups made from allowed foods. Any strained soup.  Avoid: Soups made from vegetables that are not allowed, cream or milk-based soups. Desserts and Sweets  Recommended: Sugar-free gelatin, sugar-free frozen ice pops made without sugar alcohol.  Avoid: Plain cakes and cookies, pie made with fruit, pudding, custard, cream pie. Gelatin, fruit, ice, sherbet, frozen ice pops. Ice cream, ice milk without nuts. Plain hard candy, honey, jelly, molasses, syrup, sugar, chocolate syrup, gumdrops, marshmallows. Fats and Oils  Recommended: Limit fats to less than 8 tsp per day.  Avoid: Seeds, nuts, olives, avocados. Margarine, butter, cream, mayonnaise, salad oils, plain salad dressings. Plain gravy, crisp bacon without rind. Beverages  Recommended: Water, decaffeinated teas, oral rehydration solutions, sugar-free beverages not sweetened with sugar alcohols.  Avoid: Fruit juices, caffeinated beverages (coffee, tea, soda), alcohol, sports drinks, or lemon-lime soda. Condiments  Recommended: Ketchup, mustard, horseradish, vinegar, cocoa powder. Spices in moderation: allspice, basil, bay leaves, celery powder or leaves, cinnamon, cumin powder, curry powder, ginger, mace, marjoram, onion or garlic powder, oregano, paprika, parsley flakes, ground pepper, rosemary, sage, savory, tarragon, thyme, turmeric.  Avoid: Coconut, honey. Document Released: 12/17/2003 Document Revised: 06/20/2012 Document Reviewed: 02/10/2012 [facility] Patient Information 2014 [place].

## 2013-10-30 NOTE — MAU Provider Note (Signed)
See separate progress note regarding bedside ultrasound  Attestation of Attending Supervision of Advanced Practitioner (CNM/NP): Evaluation and management procedures were performed by the Advanced Practitioner under my supervision and collaboration.  I have reviewed the Advanced Practitioner's note and chart, and I agree with the management and plan.  Shrey Boike 10/30/2013 1:15 PM

## 2013-10-30 NOTE — MAU Note (Signed)
Felt bad last night, started vomiting @ [facility], nothing stays down.  Lower abd cramping, no bleeding.

## 2013-10-30 NOTE — Progress Notes (Signed)
Patient with small intrauterine gestational sac on 12/27 ultrasound. She presented today with a complaint of nausea and emesis. No abdominal pain. Bedside ultrasound demonstrated an IUP with yolk sac and fetal pole with cardiac activity. No free fluid.  Patient scheduled to start care with [facility] tomorrow

## 2013-11-22 ENCOUNTER — Encounter: Payer: Self-pay | Admitting: Internal Medicine

## 2013-11-22 ENCOUNTER — Ambulatory Visit (INDEPENDENT_AMBULATORY_CARE_PROVIDER_SITE_OTHER): Payer: No Typology Code available for payment source | Admitting: Internal Medicine

## 2013-11-22 VITALS — BP 110/60 | HR 82 | Ht 66.0 in | Wt 167.0 lb

## 2013-11-22 DIAGNOSIS — R55 Syncope and collapse: Secondary | ICD-10-CM

## 2013-11-22 DIAGNOSIS — R002 Palpitations: Secondary | ICD-10-CM | POA: Insufficient documentation

## 2013-11-22 DIAGNOSIS — R011 Cardiac murmur, unspecified: Secondary | ICD-10-CM

## 2013-11-22 DIAGNOSIS — I951 Orthostatic hypotension: Secondary | ICD-10-CM

## 2013-11-22 NOTE — Progress Notes (Signed)
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 She presents today for evaluation of palpitations and a syncopal episode. She is now [redacted] weeks pregnant and has been having worsening palpitations over the past several weeks. She reports they're worse mostly at night but can occur during the day or with physical activity. She says that last week she had an episode of rapid heart rate and she was bending over to pick up something and then found herself on the floor without a recollection of how she got there.  She knows that she has a history of low blood pressure and has had problems with feeling faint in the past with blood draws, anxiety or stressful situations. She can't also have a low heart rate but she always attributed that to being a runner.  This is her first pregnancy.  She has a says that she was told that she might have had a murmur when she was young. She says that sometimes when she exercises she notes that her lips turned blue.  She denies any chest pain or significant shortness of breath with exertion.  PMHx:  Past Medical History  Diagnosis Date  . Medical history non-contributory     Past Surgical History  Procedure Laterality Date  . Tonsillectomy    . Dilation and curettage of uterus      FAMHx:  Family History  Problem Relation Age of Onset  . Heart murmur Mother   . Diabetes Mother   . Hypertension Father   . Hyperlipidemia Father   . Breast cancer Paternal Grandmother   . Hypertension Paternal Grandmother   . Hyperlipidemia Paternal Grandmother   . Heart disease Paternal Grandmother   . Hypertension Paternal Grandfather   . Heart disease Paternal Grandfather     CABG  . Heart attack Paternal Grandfather     X4  . Hyperlipidemia Paternal Grandfather      SOCHx:   reports that she has quit smoking. She has never used smokeless tobacco. She reports that she drinks about 0.6 ounces of alcohol per week. She reports that she does not use illicit drugs.  ALLERGIES:  Allergies  Allergen Reactions  . Prednisone Other (See Comments)    Sharp abdominal pain    ROS: A comprehensive review of systems was negative except for: Cardiovascular: positive for palpitations and syncope Neurological: positive for dizziness Pregnancy  HOME MEDS: Current Outpatient Prescriptions  Medication Sig Dispense Refill  . Prenatal Vit-Fe Fumarate-FA (PRENATAL MULTIVITAMIN) TABS tablet Take 1 tablet by mouth every morning.       No current facility-administered medications for this visit.    LABS/IMAGING: No results found for this or any previous visit (from the past 48 hour(s)). No results found.  VITALS: BP 110/60  Pulse 82  Ht 5\' 6"  (1.676 m)  Wt 167 lb (75.751 kg)  BMI 26.97 kg/m2  LMP 08/03/2013  EXAM: General appearance: alert and no distress Neck: no carotid bruit and no JVD Lungs: clear to auscultation bilaterally Heart: regular rate and rhythm, S1: normal, S2: physiologically split and systolic murmur: early systolic 2/6, crescendo at 2nd left intercostal space Abdomen: soft, non-tender; bowel sounds normal; no masses,  no organomegaly Extremities: extremities normal, atraumatic, no cyanosis or edema Pulses: 2+ and symmetric Skin: Skin color, texture, turgor normal. No  rashes or lesions Neurologic: Grossly normal Psych: Mood, affect normal  EKG: Normal sinus rhythm 82  ASSESSMENT: 1. Palpitations 2. Syncope 3. Orthostatic hypotension-suggestive of autonomic hypersensitivity 4. Pregnancy  PLAN: 1.   Sharon Hartman is experiencing her first pregnancy and unfortunately is having more rapid palpitations. She does have a history of palpitations in the past, but these are more significant than they've ever been. She is also noticing  problems with low blood pressure and had an episode of syncope, without a significant prodrome. She does not recall the episode and only notes that her heart may have been racing prior to it. Her symptoms occur nearly every day therefore I would recommend a 48-hour Holter monitor. I would also recommend an echocardiogram based on her abnormal murmur as she may have mitral valve prolapse. Concerned about her "cyanotic lips". If she truly has this intermittently she may have a shunt which could become worse during pregnancy. I would recommend a bubble study with the echocardiogram.  Plan to see her back to discuss results in a few weeks.  Thanks as always for the kind referral.  Pixie Casino, MD, [facility] Attending Cardiologist [facility]  Kevina Piloto C 11/22/2013, 4:55 PM

## 2013-11-22 NOTE — Patient Instructions (Addendum)
Your physician has requested that you have an echocardiogram with bubble study. Echocardiography is a painless test that uses sound waves to create images of your heart. It provides your doctor with information about the size and shape of your heart and how well your heart's chambers and valves are working. This procedure takes approximately one hour. There are no restrictions for this procedure.  Your physician has recommended that you wear a holter monitor for 48 hours.. Holter monitors are medical devices that record the heart's electrical activity. Doctors most often use these monitors to diagnose arrhythmias. Arrhythmias are problems with the speed or rhythm of the heartbeat. The monitor is a small, portable device. You can wear one while you do your normal daily activities. This is usually used to diagnose what is causing palpitations/syncope (passing out).  Dr. Debara Pickett has ordered compression stockings - thigh high 20-17mmHg R thigh 21 inch R calf 12 inch R ankle 7.5 inch L thigh 21 inch L calf 13.5 inch L ankle 7 inch  Your physician recommends that you schedule a follow-up appointment after your echo & monitor.

## 2013-12-04 ENCOUNTER — Ambulatory Visit (HOSPITAL_COMMUNITY): Payer: Self-pay

## 2013-12-06 ENCOUNTER — Telehealth (HOSPITAL_COMMUNITY): Payer: Self-pay | Admitting: *Deleted

## 2013-12-12 ENCOUNTER — Encounter: Payer: Self-pay | Admitting: Internal Medicine

## 2013-12-12 ENCOUNTER — Ambulatory Visit (HOSPITAL_COMMUNITY)
Admission: RE | Admit: 2013-12-12 | Discharge: 2013-12-12 | Disposition: A | Discharge status: Home / Self Care | Payer: No Typology Code available for payment source | Source: Ambulatory Visit | Attending: Cardiology | Admitting: Cardiology

## 2013-12-12 ENCOUNTER — Telehealth: Payer: Self-pay | Admitting: Internal Medicine

## 2013-12-12 DIAGNOSIS — R011 Cardiac murmur, unspecified: Secondary | ICD-10-CM | POA: Insufficient documentation

## 2013-12-12 DIAGNOSIS — R55 Syncope and collapse: Secondary | ICD-10-CM | POA: Insufficient documentation

## 2013-12-12 NOTE — Telephone Encounter (Signed)
Returned call. Provided results.

## 2013-12-12 NOTE — Telephone Encounter (Signed)
Returning your call. °

## 2013-12-12 NOTE — Progress Notes (Signed)
LMTCB

## 2013-12-18 ENCOUNTER — Other Ambulatory Visit: Payer: Self-pay | Admitting: *Deleted

## 2013-12-18 DIAGNOSIS — R55 Syncope and collapse: Secondary | ICD-10-CM

## 2013-12-18 DIAGNOSIS — R002 Palpitations: Secondary | ICD-10-CM

## 2013-12-24 ENCOUNTER — Ambulatory Visit (INDEPENDENT_AMBULATORY_CARE_PROVIDER_SITE_OTHER): Payer: No Typology Code available for payment source | Admitting: Internal Medicine

## 2013-12-24 ENCOUNTER — Encounter: Payer: Self-pay | Admitting: Internal Medicine

## 2013-12-24 VITALS — BP 110/60 | HR 96 | Ht 66.0 in | Wt 174.4 lb

## 2013-12-24 DIAGNOSIS — R55 Syncope and collapse: Secondary | ICD-10-CM

## 2013-12-24 DIAGNOSIS — R002 Palpitations: Secondary | ICD-10-CM

## 2013-12-24 DIAGNOSIS — I951 Orthostatic hypotension: Secondary | ICD-10-CM

## 2013-12-24 NOTE — Patient Instructions (Signed)
Your physician recommends that you schedule a follow-up appointment in: as needed  

## 2013-12-24 NOTE — Progress Notes (Signed)
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 She presents today for evaluation of palpitations and a syncopal episode. She is now [redacted] weeks pregnant and has been having worsening palpitations over the past several weeks. She reports they're worse mostly at night but can occur during the day or with physical activity. She says that last week she had an episode of rapid heart rate and she was bending over to pick up something and then found herself on the floor without a recollection of how she got there.  She knows that she has a history of low blood pressure and has had problems with feeling faint in the past with blood draws, anxiety or stressful situations. She can't also have a low heart rate but she always attributed that to being a runner.  This is her first pregnancy.  She has a says that she was told that she might have had a murmur when she was young. She says that sometimes when she exercises she notes that her lips turned blue.  She denies any chest pain or significant shortness of breath with exertion.  She returns today for followup of her echo and monitor. The echocardiogram showed normal systolic function and diastolic function, no valvular disease and no evidence for PFO. She wore a monitor for 48 hours which demonstrated 5 isolated ventricular beats and 2 isolated supraventricular beats out of a total of 275,000 heartbeats. Average heart rate was 106 and peak heart rate was 150. Predominate sinus rhythm was noted.  PMHx:  Past Medical History  Diagnosis Date  . Medical history non-contributory     Past Surgical History  Procedure Laterality Date  . Tonsillectomy    . Dilation and curettage of uterus      FAMHx:  Family History  Problem Relation Age of Onset  . Heart  murmur Mother   . Diabetes Mother   . Hypertension Father   . Hyperlipidemia Father   . Breast cancer Paternal Grandmother   . Hypertension Paternal Grandmother   . Hyperlipidemia Paternal Grandmother   . Heart disease Paternal Grandmother   . Hypertension Paternal Grandfather   . Heart disease Paternal Grandfather     CABG  . Heart attack Paternal Grandfather     X4  . Hyperlipidemia Paternal Grandfather     SOCHx:   reports that she has quit smoking. She has never used smokeless tobacco. She reports that she drinks about 0.6 ounces of alcohol per week. She reports that she does not use illicit drugs.  ALLERGIES:  Allergies  Allergen Reactions  . Prednisone Other (See Comments)    Sharp abdominal pain    ROS: A comprehensive review of systems was negative except for: Cardiovascular: positive for palpitations and syncope Neurological: positive for dizziness Pregnancy  HOME MEDS: Current Outpatient Prescriptions  Medication Sig Dispense Refill  . Prenatal Vit-Fe Fumarate-FA (PRENATAL MULTIVITAMIN) TABS tablet Take 1 tablet by mouth every morning.       No current facility-administered medications for this visit.    LABS/IMAGING: No results found for this or any previous visit (from the past 48 hour(s)). No results found.  VITALS: BP 110/60  Pulse 96  Ht 5\' 6"  (1.676 m)  Wt 174 lb 6.4 oz (79.107 kg)  BMI 28.16 kg/m2  LMP 08/03/2013  EXAM: deferred  EKG: deferred  ASSESSMENT: 1. Palpitations - isolated PVC's and PAC's 2. Syncope 3. Orthostatic hypotension-suggestive of autonomic hypersensitivity 4. Pregnancy  PLAN: 1.   [name] had essentially a normal echocardiogram with no evidence for intra-atrial shunting by saline microbubble contrast. Valvular architecture is normal and systolic and diastolic function are normal.  She reports she's had no further episodes of syncope, and hopefully this will improve as she goes on throughout her pregnancy. She did  have isolated PVCs and PACs, but very infrequently during her 48 hour Holter. For now I would not recommend any additional therapy.  Plan to see her back as needed if her symptoms worsen.  Pixie Casino, MD, [facility] Attending Cardiologist [facility]  Elmyra Banwart C 12/24/2013, 4:28 PM

## 2014-02-20 ENCOUNTER — Ambulatory Visit (INDEPENDENT_AMBULATORY_CARE_PROVIDER_SITE_OTHER): Payer: No Typology Code available for payment source | Admitting: Physician Assistant

## 2014-02-20 VITALS — BP 100/60 | HR 81 | Temp 98.4°F | Resp 18 | Ht 66.0 in

## 2014-02-20 DIAGNOSIS — Z2089 Contact with and (suspected) exposure to other communicable diseases: Secondary | ICD-10-CM

## 2014-02-20 DIAGNOSIS — J029 Acute pharyngitis, unspecified: Secondary | ICD-10-CM

## 2014-02-20 DIAGNOSIS — Z20818 Contact with and (suspected) exposure to other bacterial communicable diseases: Secondary | ICD-10-CM

## 2014-02-20 LAB — POCT RAPID STREP A (OFFICE): Rapid Strep A Screen: NEGATIVE

## 2014-02-20 MED ORDER — FIRST-DUKES MOUTHWASH MT SUSP: 10.0000 mL | OROMUCOSAL | Status: DC | PRN

## 2014-02-20 NOTE — Patient Instructions (Signed)
Rapid strep test is negative, but I have sent a culture to confirm this - should be back in about 48 hours  In the mean time, continue Tylenol for pain relief/fever relief  Use the Magic Mouthwash every 2 hours if needed for throat pain  Plenty of fluids  Eat what you can tolerate  If any symptoms are worsening please let me know   Pharyngitis Pharyngitis is redness, pain, and swelling (inflammation) of your pharynx.  CAUSES  Pharyngitis is usually caused by infection. Most of the time, these infections are from viruses (viral) and are part of a cold. However, sometimes pharyngitis is caused by bacteria (bacterial). Pharyngitis can also be caused by allergies. Viral pharyngitis may be spread from person to person by coughing, sneezing, and personal items or utensils (cups, forks, spoons, toothbrushes). Bacterial pharyngitis may be spread from person to person by more intimate contact, such as kissing.  SIGNS AND SYMPTOMS  Symptoms of pharyngitis include:   Sore throat.   Tiredness (fatigue).   Low-grade fever.   Headache.  Joint pain and muscle aches.  Skin rashes.  Swollen lymph nodes.  Plaque-like film on throat or tonsils (often seen with bacterial pharyngitis). DIAGNOSIS  Your health care provider will ask you questions about your illness and your symptoms. Your medical history, along with a physical exam, is often all that is needed to diagnose pharyngitis. Sometimes, a rapid strep test is done. Other lab tests may also be done, depending on the suspected cause.  TREATMENT  Viral pharyngitis will usually get better in 3 4 days without the use of medicine. Bacterial pharyngitis is treated with medicines that kill germs (antibiotics).  HOME CARE INSTRUCTIONS   Drink enough water and fluids to keep your urine clear or pale yellow.   Only take over-the-counter or prescription medicines as directed by your health care provider:   If you are prescribed antibiotics,  make sure you finish them even if you start to feel better.   Do not take aspirin.   Get lots of rest.   Gargle with 8 oz of salt water ( tsp of salt per 1 qt of water) as often as every 1 2 hours to soothe your throat.   Throat lozenges (if you are not at risk for choking) or sprays may be used to soothe your throat. SEEK MEDICAL CARE IF:   You have large, tender lumps in your neck.  You have a rash.  You cough up green, yellow-brown, or bloody spit. SEEK IMMEDIATE MEDICAL CARE IF:   Your neck becomes stiff.  You drool or are unable to swallow liquids.  You vomit or are unable to keep medicines or liquids down.  You have severe pain that does not go away with the use of recommended medicines.  You have trouble breathing (not caused by a stuffy nose). MAKE SURE YOU:   Understand these instructions.  Will watch your condition.  Will get help right away if you are not doing well or get worse. Document Released: 09/26/2005 Document Revised: 07/17/2013 Document Reviewed: 06/03/2013 [facility] Patient Information 2014 [place].

## 2014-02-20 NOTE — Progress Notes (Signed)
   Subjective:    Patient ID: Sharon Hartman, female    DOB: Oct 26, 1980, 33 y.o.   MRN: [id number]  HPI   Sharon Hartman is a very pleasant 33 yr old female here with concern for strep throat.  Sister and nephew were diagnosed today.  Sharon Hartman has been "feeling horrible."  Symptoms include sore throat, difficulty swallowing, swollen glands.  Fever to 100.34F today - responsive to Tylenol.  Does have a little runny nose as well but no cough.  Sharon Hartman is [redacted] wks pregnant   Review of Systems  Constitutional: Positive for fever. Negative for chills.  HENT: Positive for rhinorrhea and sore throat. Negative for ear pain.   Respiratory: Negative for cough, shortness of breath and wheezing.   Cardiovascular: Negative.   Gastrointestinal: Negative.   Musculoskeletal: Negative.   Skin: Negative.        Objective:   Physical Exam  Vitals reviewed. Constitutional: Sharon Hartman is oriented to person, place, and time. Sharon Hartman appears well-developed and well-nourished. No distress.  HENT:  Head: Normocephalic and atraumatic.  Right Ear: Tympanic membrane and ear canal normal.  Left Ear: Tympanic membrane and ear canal normal.  Mouth/Throat: Uvula is midline. No oropharyngeal exudate, posterior oropharyngeal edema or posterior oropharyngeal erythema.  Eyes: Conjunctivae are normal. No scleral icterus.  Neck: Neck supple.  Cardiovascular: Normal rate, regular rhythm and normal heart sounds.   Pulmonary/Chest: Effort normal and breath sounds normal. Sharon Hartman has no wheezes. Sharon Hartman has no rales.  Lymphadenopathy:    Sharon Hartman has cervical adenopathy.  Neurological: Sharon Hartman is alert and oriented to person, place, and time.  Skin: Skin is warm and dry.  Psychiatric: Sharon Hartman has a normal mood and affect. Her behavior is normal.    Results for orders placed in visit on 02/20/14  POCT RAPID STREP A (OFFICE)      Result Value Ref Range   Rapid Strep A Screen Negative  Negative       Assessment & Plan:  Acute pharyngitis - Plan: POCT rapid strep A,  Culture, Group A Strep, Diphenhyd-Hydrocort-Nystatin (FIRST-DUKES MOUTHWASH) SUSP  Exposure to strep throat   Sharon Hartman is a very pleasant 33 yr old female here with sore throat and exposure to strep.  Rapid strep is negative today.  Cx sent.  Sharon Hartman is in agreement with holding abx until cx returns.  For now, continue Tylenol prn fever/pain.  Magic Mouthwash q2h prn.  Push fluids.  Eat as tolerated.  Sharon Hartman to call if worsening prior to final culture results    E. Natividad Brood MHS, PA-C [facility] 5/14/201510:14 PM

## 2014-02-23 LAB — CULTURE, GROUP A STREP: Organism ID, Bacteria: NORMAL

## 2014-03-17 LAB — OB RESULTS CONSOLE ANTIBODY SCREEN: Antibody Screen: NEGATIVE

## 2014-03-17 LAB — OB RESULTS CONSOLE HEPATITIS B SURFACE ANTIGEN: Hepatitis B Surface Ag: NEGATIVE

## 2014-03-17 LAB — OB RESULTS CONSOLE ABO/RH: RH Type: POSITIVE

## 2014-03-17 LAB — OB RESULTS CONSOLE RPR: RPR: NONREACTIVE

## 2014-03-17 LAB — OB RESULTS CONSOLE HIV ANTIBODY (ROUTINE TESTING): HIV: NONREACTIVE

## 2014-03-17 LAB — OB RESULTS CONSOLE RUBELLA ANTIBODY, IGM: Rubella: IMMUNE

## 2014-03-27 LAB — OB RESULTS CONSOLE RPR: RPR: NONREACTIVE

## 2014-04-01 ENCOUNTER — Encounter (HOSPITAL_COMMUNITY): Payer: Self-pay

## 2014-05-05 LAB — OB RESULTS CONSOLE GBS: GBS: NEGATIVE

## 2014-05-10 ENCOUNTER — Encounter (HOSPITAL_COMMUNITY): Payer: Self-pay | Admitting: *Deleted

## 2014-05-10 ENCOUNTER — Inpatient Hospital Stay (HOSPITAL_COMMUNITY)
Admission: AD | Admit: 2014-05-10 | Discharge: 2014-05-11 | Disposition: A | Discharge status: Home / Self Care | Payer: 59 | Source: Ambulatory Visit | Attending: Obstetrics and Gynecology | Admitting: Obstetrics and Gynecology

## 2014-05-10 DIAGNOSIS — N898 Other specified noninflammatory disorders of vagina: Secondary | ICD-10-CM | POA: Diagnosis not present

## 2014-05-10 DIAGNOSIS — O212 Late vomiting of pregnancy: Secondary | ICD-10-CM | POA: Diagnosis not present

## 2014-05-10 DIAGNOSIS — O99891 Other specified diseases and conditions complicating pregnancy: Secondary | ICD-10-CM | POA: Diagnosis present

## 2014-05-10 DIAGNOSIS — Z87891 Personal history of nicotine dependence: Secondary | ICD-10-CM | POA: Insufficient documentation

## 2014-05-10 DIAGNOSIS — Z3689 Encounter for other specified antenatal screening: Secondary | ICD-10-CM

## 2014-05-10 DIAGNOSIS — O9989 Other specified diseases and conditions complicating pregnancy, childbirth and the puerperium: Principal | ICD-10-CM

## 2014-05-10 LAB — WET PREP, GENITAL
Clue Cells Wet Prep HPF POC: NONE SEEN
Trich, Wet Prep: NONE SEEN
Yeast Wet Prep HPF POC: NONE SEEN

## 2014-05-10 LAB — AMNISURE RUPTURE OF MEMBRANE (ROM) NOT AT ARMC: Amnisure ROM: NEGATIVE

## 2014-05-10 NOTE — MAU Note (Signed)
Pt reports she vomited this morning and some fluid leaked out. Happened again this afternoon. Is still having some discharge but not anything trickling out. Reports some mild braxton hicks ctx but nothing consistent or painful. Good fetal movement reported

## 2014-05-10 NOTE — Discharge Instructions (Signed)
Fetal Movement Counts °Patient Name: __________________________________________________ Patient Due Date: ____________________ °Performing a fetal movement count is highly recommended in high-risk pregnancies, but it is good for every pregnant woman to do. Your health care provider may ask you to start counting fetal movements at 28 weeks of the pregnancy. Fetal movements often increase: °· After eating a full meal. °· After physical activity. °· After eating or drinking something sweet or cold. °· At rest. °Pay attention to when you feel the baby is most active. This will help you notice a pattern of your baby's sleep and wake cycles and what factors contribute to an increase in fetal movement. It is important to perform a fetal movement count at the same time each day when your baby is normally most active.  °HOW TO COUNT FETAL MOVEMENTS °1. Find a quiet and comfortable area to sit or lie down on your left side. Lying on your left side provides the best blood and oxygen circulation to your baby. °2. Write down the day and time on a sheet of paper or in a journal. °3. Start counting kicks, flutters, swishes, rolls, or jabs in a 2-hour period. You should feel at least 10 movements within 2 hours. °4. If you do not feel 10 movements in 2 hours, wait 2-3 hours and count again. Look for a change in the pattern or not enough counts in 2 hours. °SEEK MEDICAL CARE IF: °· You feel less than 10 counts in 2 hours, tried twice. °· There is no movement in over an hour. °· The pattern is changing or taking longer each day to reach 10 counts in 2 hours. °· You feel the baby is not moving as he or she usually does. °Date: ____________ Movements: ____________ Start time: ____________ Finish time: ____________  °Date: ____________ Movements: ____________ Start time: ____________ Finish time: ____________ °Date: ____________ Movements: ____________ Start time: ____________ Finish time: ____________ °Date: ____________ Movements:  ____________ Start time: ____________ Finish time: ____________ °Date: ____________ Movements: ____________ Start time: ____________ Finish time: ____________ °Date: ____________ Movements: ____________ Start time: ____________ Finish time: ____________ °Date: ____________ Movements: ____________ Start time: ____________ Finish time: ____________ °Date: ____________ Movements: ____________ Start time: ____________ Finish time: ____________  °Date: ____________ Movements: ____________ Start time: ____________ Finish time: ____________ °Date: ____________ Movements: ____________ Start time: ____________ Finish time: ____________ °Date: ____________ Movements: ____________ Start time: ____________ Finish time: ____________ °Date: ____________ Movements: ____________ Start time: ____________ Finish time: ____________ °Date: ____________ Movements: ____________ Start time: ____________ Finish time: ____________ °Date: ____________ Movements: ____________ Start time: ____________ Finish time: ____________ °Date: ____________ Movements: ____________ Start time: ____________ Finish time: ____________  °Date: ____________ Movements: ____________ Start time: ____________ Finish time: ____________ °Date: ____________ Movements: ____________ Start time: ____________ Finish time: ____________ °Date: ____________ Movements: ____________ Start time: ____________ Finish time: ____________ °Date: ____________ Movements: ____________ Start time: ____________ Finish time: ____________ °Date: ____________ Movements: ____________ Start time: ____________ Finish time: ____________ °Date: ____________ Movements: ____________ Start time: ____________ Finish time: ____________ °Date: ____________ Movements: ____________ Start time: ____________ Finish time: ____________  °Date: ____________ Movements: ____________ Start time: ____________ Finish time: ____________ °Date: ____________ Movements: ____________ Start time: ____________ Finish  time: ____________ °Date: ____________ Movements: ____________ Start time: ____________ Finish time: ____________ °Date: ____________ Movements: ____________ Start time: ____________ Finish time: ____________ °Date: ____________ Movements: ____________ Start time: ____________ Finish time: ____________ °Date: ____________ Movements: ____________ Start time: ____________ Finish time: ____________ °Date: ____________ Movements: ____________ Start time: ____________ Finish time: ____________  °Date: ____________ Movements: ____________ Start time: ____________ Finish   time: ____________ °Date: ____________ Movements: ____________ Start time: ____________ Finish time: ____________ °Date: ____________ Movements: ____________ Start time: ____________ Finish time: ____________ °Date: ____________ Movements: ____________ Start time: ____________ Finish time: ____________ °Date: ____________ Movements: ____________ Start time: ____________ Finish time: ____________ °Date: ____________ Movements: ____________ Start time: ____________ Finish time: ____________ °Date: ____________ Movements: ____________ Start time: ____________ Finish time: ____________  °Date: ____________ Movements: ____________ Start time: ____________ Finish time: ____________ °Date: ____________ Movements: ____________ Start time: ____________ Finish time: ____________ °Date: ____________ Movements: ____________ Start time: ____________ Finish time: ____________ °Date: ____________ Movements: ____________ Start time: ____________ Finish time: ____________ °Date: ____________ Movements: ____________ Start time: ____________ Finish time: ____________ °Date: ____________ Movements: ____________ Start time: ____________ Finish time: ____________ °Date: ____________ Movements: ____________ Start time: ____________ Finish time: ____________  °Date: ____________ Movements: ____________ Start time: ____________ Finish time: ____________ °Date: ____________  Movements: ____________ Start time: ____________ Finish time: ____________ °Date: ____________ Movements: ____________ Start time: ____________ Finish time: ____________ °Date: ____________ Movements: ____________ Start time: ____________ Finish time: ____________ °Date: ____________ Movements: ____________ Start time: ____________ Finish time: ____________ °Date: ____________ Movements: ____________ Start time: ____________ Finish time: ____________ °Date: ____________ Movements: ____________ Start time: ____________ Finish time: ____________  °Date: ____________ Movements: ____________ Start time: ____________ Finish time: ____________ °Date: ____________ Movements: ____________ Start time: ____________ Finish time: ____________ °Date: ____________ Movements: ____________ Start time: ____________ Finish time: ____________ °Date: ____________ Movements: ____________ Start time: ____________ Finish time: ____________ °Date: ____________ Movements: ____________ Start time: ____________ Finish time: ____________ °Date: ____________ Movements: ____________ Start time: ____________ Finish time: ____________ °Document Released: 10/26/2006 Document Revised: 02/10/2014 Document Reviewed: 07/23/2012 °[employer]® Patient Information ©2015 [employer]. This information is not intended to replace advice given to you by your health care provider. Make sure you discuss any questions you have with your health care provider. °Braxton Hicks Contractions °Contractions of the uterus can occur throughout pregnancy. Contractions are not always a sign that you are in labor.  °WHAT ARE BRAXTON HICKS CONTRACTIONS?  °Contractions that occur before labor are called Braxton Hicks contractions, or false labor. Toward the end of pregnancy (32-34 weeks), these contractions can develop more often and may become more forceful. This is not true labor because these contractions do not result in opening (dilatation) and thinning of the cervix. They  are sometimes difficult to tell apart from true labor because these contractions can be forceful and people have different pain tolerances. You should not feel embarrassed if you go to the hospital with false labor. Sometimes, the only way to tell if you are in true labor is for your health care provider to look for changes in the cervix. °If there are no prenatal problems or other health problems associated with the pregnancy, it is completely safe to be sent home with false labor and await the onset of true labor. °HOW CAN YOU TELL THE DIFFERENCE BETWEEN TRUE AND FALSE LABOR? °False Labor °· The contractions of false labor are usually shorter and not as hard as those of true labor.   °· The contractions are usually irregular.   °· The contractions are often felt in the front of the lower abdomen and in the groin.   °· The contractions may go away when you walk around or change positions while lying down.   °· The contractions get weaker and are shorter lasting as time goes on.   °· The contractions do not usually become progressively stronger, regular, and closer together as with true labor.   °True Labor °· Contractions in true labor last 30-70 seconds, become   very regular, usually become more intense, and increase in frequency.   °· The contractions do not go away with walking.   °· The discomfort is usually felt in the top of the uterus and spreads to the lower abdomen and low back.   °· True labor can be determined by your health care provider with an exam. This will show that the cervix is dilating and getting thinner.   °WHAT TO REMEMBER °· Keep up with your usual exercises and follow other instructions given by your health care provider.   °· Take medicines as directed by your health care provider.   °· Keep your regular prenatal appointments.   °· Eat and drink lightly if you think you are going into labor.   °· If Braxton Hicks contractions are making you uncomfortable:   °¨ Change your position from  lying down or resting to walking, or from walking to resting.   °¨ Sit and rest in a tub of warm water.   °¨ Drink 2-3 glasses of water. Dehydration may cause these contractions.   °¨ Do slow and deep breathing several times an hour.   °WHEN SHOULD I SEEK IMMEDIATE MEDICAL CARE? °Seek immediate medical care if: °· Your contractions become stronger, more regular, and closer together.   °· You have fluid leaking or gushing from your vagina.   °· You have a fever.   °· You pass blood-tinged mucus.   °· You have vaginal bleeding.   °· You have continuous abdominal pain.   °· You have low back pain that you never had before.   °· You feel your baby's head pushing down and causing pelvic pressure.   °· Your baby is not moving as much as it used to.   °Document Released: 09/26/2005 Document Revised: 10/01/2013 Document Reviewed: 07/08/2013 °[employer]® Patient Information ©2015 [employer]. This information is not intended to replace advice given to you by your health care provider. Make sure you discuss any questions you have with your health care provider. ° °

## 2014-05-10 NOTE — [facility] Provider Note (Signed)
History     CSN: [id number]  Arrival date and time: 05/10/14 2222 Provider notified of pt arrival @2255  Provider at bedside @2305      No chief complaint on file.  HPI Comments: [id number] @35 .5 wks c/o gush of clear, watery fluid with episode of vomiting at 1130 and 1400 today. Denies urinary incontinence. Good FM. No ctx or VB. Pregnancy complicated by N/V in 3rd trimester, few near syncopal episodes, h/o LEEP and cone with normal CL.   OB History   Grav Para Term Preterm Abortions TAB SAB Ect Mult Living   2    1 1     0      Past Medical History  Diagnosis Date  . Medical history non-contributory     Past Surgical History  Procedure Laterality Date  . Tonsillectomy    . Dilation and curettage of uterus      Family History  Problem Relation Age of Onset  . Heart murmur Mother   . Diabetes Mother   . Hypertension Father   . Hyperlipidemia Father   . Breast cancer Paternal Grandmother   . Hypertension Paternal Grandmother   . Hyperlipidemia Paternal Grandmother   . Heart disease Paternal Grandmother   . Hypertension Paternal Grandfather   . Heart disease Paternal Grandfather     CABG  . Heart attack Paternal Grandfather     X4  . Hyperlipidemia Paternal Grandfather     History  Substance Use Topics  . Smoking status: Former Research scientist (life sciences)  . Smokeless tobacco: Never Used  . Alcohol Use: 0.6 oz/week    1 Glasses of wine per week     Comment: socially    Allergies:  Allergies  Allergen Reactions  . Prednisone Other (See Comments)    Sharp abdominal pain    Prescriptions prior to admission  Medication Sig Dispense Refill  . Diphenhyd-Hydrocort-Nystatin (FIRST-DUKES MOUTHWASH) SUSP Take 10 mLs by mouth every 2 (two) hours as needed. With 1:1 ratio viscous lidocaine  360 mL  0  . Prenatal Vit-Fe Fumarate-FA (PRENATAL MULTIVITAMIN) TABS tablet Take 1 tablet by mouth every morning.        Review of Systems  Constitutional: Negative.   HENT: Negative.   Eyes:  Negative.   Respiratory: Negative.   Cardiovascular: Negative.   Gastrointestinal: Negative.   Genitourinary: Negative.   Musculoskeletal: Negative.   Skin: Negative.   Neurological: Negative.   Endo/Heme/Allergies: Negative.   Psychiatric/Behavioral: Negative.    Physical Exam   Blood pressure 122/68, pulse 88, temperature 98.1 F (36.7 C), temperature source Oral, resp. rate 18, height 5\' 6"  (1.676 m), last menstrual period 08/03/2013.  Physical Exam  Constitutional: She is oriented to person, place, and time. She appears well-developed and well-nourished.  HENT:  Head: Normocephalic.  Neck: Normal range of motion.  Cardiovascular: Normal rate.   Respiratory: Effort normal.  GI: Soft. There is no tenderness.  Gravid, vtx by [employer]  Genitourinary: Vagina normal.  Speculum: white clumpy discharge, no pool, neg fern, amnisure and wet prep collected SVE: 0/30/-3  Musculoskeletal: Normal range of motion.  Neurological: She is alert and oriented to person, place, and time.  Skin: Skin is warm and dry.  Psychiatric: She has a normal mood and affect.  EFM: 120 bpm, mod variability, +accels, no decels Toco: rare, mild  MAU Course  Procedures  Amnisure-neg Wet prep-neg Bedside sono-vtx  Assessment and Plan  35.5 weeks Leukorrhea Reactive NST  Discharge home Labor precautions & FMCs Keep appt in office in 3 days  Alaria Oconnor, [place], N 05/10/2014, 11:25 PM

## 2014-05-11 DIAGNOSIS — O99891 Other specified diseases and conditions complicating pregnancy: Secondary | ICD-10-CM | POA: Diagnosis not present

## 2014-05-11 LAB — POCT FERN TEST: POCT Fern Test: NEGATIVE

## 2014-05-25 ENCOUNTER — Encounter (HOSPITAL_COMMUNITY): Payer: Self-pay | Admitting: *Deleted

## 2014-05-25 ENCOUNTER — Inpatient Hospital Stay (HOSPITAL_COMMUNITY)
Admission: AD | Admit: 2014-05-25 | Discharge: 2014-05-25 | Disposition: A | Discharge status: Home / Self Care | Payer: 59 | Source: Ambulatory Visit | Attending: Obstetrics & Gynecology | Admitting: Obstetrics & Gynecology

## 2014-05-25 DIAGNOSIS — Z87891 Personal history of nicotine dependence: Secondary | ICD-10-CM | POA: Diagnosis not present

## 2014-05-25 DIAGNOSIS — E860 Dehydration: Secondary | ICD-10-CM

## 2014-05-25 DIAGNOSIS — R51 Headache: Secondary | ICD-10-CM | POA: Diagnosis not present

## 2014-05-25 DIAGNOSIS — O212 Late vomiting of pregnancy: Secondary | ICD-10-CM | POA: Insufficient documentation

## 2014-05-25 LAB — CBC
HCT: 36.4 % (ref 36.0–46.0)
Hemoglobin: 12.5 g/dL (ref 12.0–15.0)
MCH: 30.6 pg (ref 26.0–34.0)
MCHC: 34.3 g/dL (ref 30.0–36.0)
MCV: 89.2 fL (ref 78.0–100.0)
Platelets: 295 10*3/uL (ref 150–400)
RBC: 4.08 MIL/uL (ref 3.87–5.11)
RDW: 13.3 % (ref 11.5–15.5)
WBC: 7.2 10*3/uL (ref 4.0–10.5)

## 2014-05-25 LAB — URINALYSIS, ROUTINE W REFLEX MICROSCOPIC
Bilirubin Urine: NEGATIVE
Glucose, UA: NEGATIVE mg/dL
Ketones, ur: NEGATIVE mg/dL
Nitrite: NEGATIVE
Protein, ur: NEGATIVE mg/dL
Specific Gravity, Urine: <1.005 — ABNORMAL LOW (ref 1.005–1.030)
Urobilinogen, UA: 0.2 mg/dL (ref 0.0–1.0)
pH: 7 (ref 5.0–8.0)

## 2014-05-25 LAB — URINE MICROSCOPIC-ADD ON

## 2014-05-25 LAB — COMPREHENSIVE METABOLIC PANEL
ALT: 7 U/L (ref 0–35)
AST: 20 U/L (ref 0–37)
Albumin: 3.1 g/dL — ABNORMAL LOW (ref 3.5–5.2)
Alkaline Phosphatase: 149 U/L — ABNORMAL HIGH (ref 39–117)
Anion gap: 14 (ref 5–15)
BUN: 5 mg/dL — ABNORMAL LOW (ref 6–23)
CO2: 19 mEq/L (ref 19–32)
Calcium: 9.2 mg/dL (ref 8.4–10.5)
Chloride: 103 mEq/L (ref 96–112)
Creatinine, Ser: 0.48 mg/dL — ABNORMAL LOW (ref 0.50–1.10)
GFR calc Af Amer: >90 mL/min (ref >90)
GFR calc non Af Amer: >90 mL/min (ref >90)
Glucose, Bld: 79 mg/dL (ref 70–99)
Potassium: 4.3 mEq/L (ref 3.7–5.3)
Sodium: 136 mEq/L — ABNORMAL LOW (ref 137–147)
Total Bilirubin: 0.3 mg/dL (ref 0.3–1.2)
Total Protein: 6.2 g/dL (ref 6.0–8.3)

## 2014-05-25 MED ORDER — ONDANSETRON 8 MG PO TBDP: 8.0000 mg | ORAL_TABLET | Freq: Three times a day (TID) | ORAL | Status: DC | PRN

## 2014-05-25 MED ORDER — PROMETHAZINE HCL 25 MG/ML IJ SOLN
25.0000 mg | Freq: Once | INTRAMUSCULAR | Status: DC
  Filled 2014-05-25: qty 1

## 2014-05-25 MED ORDER — DEXTROSE 5 % IN LACTATED RINGERS IV BOLUS: 500.0000 mL | Freq: Once | INTRAVENOUS | Status: DC

## 2014-05-25 MED ORDER — PROMETHAZINE HCL 25 MG/ML IJ SOLN: 25.0000 mg | Freq: Once | INTRAMUSCULAR | Status: DC

## 2014-05-25 NOTE — Discharge Instructions (Signed)
Dehydration, Adult °Dehydration is when you lose more fluids from the body than you take in. Vital organs like the kidneys, brain, and heart cannot function without a proper amount of fluids and salt. Any loss of fluids from the body can cause dehydration.  °CAUSES  °· Vomiting. °· Diarrhea. °· Excessive sweating. °· Excessive urine output. °· Fever. °SYMPTOMS  °Mild dehydration °· Thirst. °· Dry lips. °· Slightly dry mouth. °Moderate dehydration °· Very dry mouth. °· Sunken eyes. °· Skin does not bounce back quickly when lightly pinched and released. °· Dark urine and decreased urine production. °· Decreased tear production. °· Headache. °Severe dehydration °· Very dry mouth. °· Extreme thirst. °· Rapid, weak pulse (more than 100 beats per minute at rest). °· Cold hands and feet. °· Not able to sweat in spite of heat and temperature. °· Rapid breathing. °· Blue lips. °· Confusion and lethargy. °· Difficulty being awakened. °· Minimal urine production. °· No tears. °DIAGNOSIS  °Your caregiver will diagnose dehydration based on your symptoms and your exam. Blood and urine tests will help confirm the diagnosis. The diagnostic evaluation should also identify the cause of dehydration. °TREATMENT  °Treatment of mild or moderate dehydration can often be done at home by increasing the amount of fluids that you drink. It is best to drink small amounts of fluid more often. Drinking too much at one time can make vomiting worse. Refer to the home care instructions below. °Severe dehydration needs to be treated at the hospital where you will probably be given intravenous (IV) fluids that contain water and electrolytes. °HOME CARE INSTRUCTIONS  °· Ask your caregiver about specific rehydration instructions. °· Drink enough fluids to keep your urine clear or pale yellow. °· Drink small amounts frequently if you have nausea and vomiting. °· Eat as you normally do. °· Avoid: °¨ Foods or drinks high in sugar. °¨ Carbonated  drinks. °¨ Juice. °¨ Extremely hot or cold fluids. °¨ Drinks with caffeine. °¨ Fatty, greasy foods. °¨ Alcohol. °¨ Tobacco. °¨ Overeating. °¨ Gelatin desserts. °· Wash your hands well to avoid spreading bacteria and viruses. °· Only take over-the-counter or prescription medicines for pain, discomfort, or fever as directed by your caregiver. °· Ask your caregiver if you should continue all prescribed and over-the-counter medicines. °· Keep all follow-up appointments with your caregiver. °SEEK MEDICAL CARE IF: °· You have abdominal pain and it increases or stays in one area (localizes). °· You have a rash, stiff neck, or severe headache. °· You are irritable, sleepy, or difficult to awaken. °· You are weak, dizzy, or extremely thirsty. °SEEK IMMEDIATE MEDICAL CARE IF:  °· You are unable to keep fluids down or you get worse despite treatment. °· You have frequent episodes of vomiting or diarrhea. °· You have blood or green matter (bile) in your vomit. °· You have blood in your stool or your stool looks black and tarry. °· You have not urinated in 6 to 8 hours, or you have only urinated a small amount of very dark urine. °· You have a fever. °· You faint. °MAKE SURE YOU:  °· Understand these instructions. °· Will watch your condition. °· Will get help right away if you are not doing well or get worse. °Document Released: 09/26/2005 Document Revised: 12/19/2011 Document Reviewed: 05/16/2011 °[employer]® Patient Information ©2015 [employer]. This information is not intended to replace advice given to you by your health care provider. Make sure you discuss any questions you have with your health care   provider.  Food Choices to Help Relieve Nausea/Diarrhea When you have diarrhea, the foods you eat and your eating habits are very important. Choosing the right foods and drinks can help relieve diarrhea. Also, because diarrhea can last up to 7 days, you need to replace lost fluids and electrolytes (such as sodium,  potassium, and chloride) in order to help prevent dehydration.  WHAT GENERAL GUIDELINES DO I NEED TO FOLLOW?  Slowly drink 1 cup (8 oz) of fluid for each episode of diarrhea. If you are getting enough fluid, your urine will be clear or pale yellow.  Eat starchy foods. Some good choices include white rice, white toast, pasta, low-fiber cereal, baked potatoes (without the skin), saltine crackers, and bagels.  Avoid large servings of any cooked vegetables.  Limit fruit to two servings per day. A serving is  cup or 1 small piece.  Choose foods with less than 2 g of fiber per serving.  Limit fats to less than 8 tsp (38 g) per day.  Avoid fried foods.  Eat foods that have probiotics in them. Probiotics can be found in certain dairy products.  Avoid foods and beverages that may increase the speed at which food moves through the stomach and intestines (gastrointestinal tract). Things to avoid include:  High-fiber foods, such as dried fruit, raw fruits and vegetables, nuts, seeds, and whole grain foods.  Spicy foods and high-fat foods.  Foods and beverages sweetened with high-fructose corn syrup, honey, or sugar alcohols such as xylitol, sorbitol, and mannitol. WHAT FOODS ARE RECOMMENDED? Grains White rice. White, [place], or pita breads (fresh or toasted), including plain rolls, buns, or bagels. White pasta. Saltine, soda, or graham crackers. Pretzels. Low-fiber cereal. Cooked cereals made with water (such as cornmeal, farina, or cream cereals). Plain muffins. Matzo. Melba toast. Zwieback.  Vegetables Potatoes (without the skin). Strained tomato and vegetable juices. Most well-cooked and canned vegetables without seeds. Tender lettuce. Fruits Cooked or canned applesauce, apricots, cherries, fruit cocktail, grapefruit, peaches, pears, or plums. Fresh bananas, apples without skin, cherries, grapes, cantaloupe, grapefruit, peaches, oranges, or plums.  Meat and Other Protein Products Baked or  boiled chicken. Eggs. Tofu. Fish. Seafood. Smooth peanut butter. Ground or well-cooked tender beef, ham, veal, lamb, pork, or poultry.  Dairy Plain yogurt, kefir, and unsweetened liquid yogurt. Lactose-free milk, buttermilk, or soy milk. Plain hard cheese. Beverages Sport drinks. Clear broths. Diluted fruit juices (except prune). Regular, caffeine-free sodas such as ginger ale. Water. Decaffeinated teas. Oral rehydration solutions. Sugar-free beverages not sweetened with sugar alcohols. Other Bouillon, broth, or soups made from recommended foods.  The items listed above may not be a complete list of recommended foods or beverages. Contact your dietitian for more options. WHAT FOODS ARE NOT RECOMMENDED? Grains Whole grain, whole wheat, bran, or rye breads, rolls, pastas, crackers, and cereals. Wild or brown rice. Cereals that contain more than 2 g of fiber per serving. Corn tortillas or taco shells. Cooked or dry oatmeal. Granola. Popcorn. Vegetables Raw vegetables. Cabbage, broccoli, Brussels sprouts, artichokes, baked beans, beet greens, corn, kale, legumes, peas, sweet potatoes, and yams. Potato skins. Cooked spinach and cabbage. Fruits Dried fruit, including raisins and dates. Raw fruits. Stewed or dried prunes. Fresh apples with skin, apricots, mangoes, pears, raspberries, and strawberries.  Meat and Other Protein Products Chunky peanut butter. Nuts and seeds. Beans and lentils. Berniece Salines.  Dairy High-fat cheeses. Milk, chocolate milk, and beverages made with milk, such as milk shakes. Cream. Ice cream. Sweets and Desserts Sweet rolls, doughnuts, and  sweet breads. Pancakes and waffles. Fats and Oils Butter. Cream sauces. Margarine. Salad oils. Plain salad dressings. Olives. Avocados.  Beverages Caffeinated beverages (such as coffee, tea, soda, or energy drinks). Alcoholic beverages. Fruit juices with pulp. Prune juice. Soft drinks sweetened with high-fructose corn syrup or sugar  alcohols. Other Coconut. Hot sauce. Chili powder. Mayonnaise. Gravy. Cream-based or milk-based soups.  The items listed above may not be a complete list of foods and beverages to avoid. Contact your dietitian for more information. WHAT SHOULD I DO IF I BECOME DEHYDRATED? Diarrhea can sometimes lead to dehydration. Signs of dehydration include dark urine and dry mouth and skin. If you think you are dehydrated, you should rehydrate with an oral rehydration solution. These solutions can be purchased at pharmacies, retail stores, or online.  Drink -1 cup (120-240 mL) of oral rehydration solution each time you have an episode of diarrhea. If drinking this amount makes your diarrhea worse, try drinking smaller amounts more often. For example, drink 1-3 tsp (5-15 mL) every 5-10 minutes.  A general rule for staying hydrated is to drink 1-2 L of fluid per day. Talk to your health care provider about the specific amount you should be drinking each day. Drink enough fluids to keep your urine clear or pale yellow. Document Released: 12/17/2003 Document Revised: 10/01/2013 Document Reviewed: 08/19/2013 [facility] Patient Information 2015 [place], [place]. This information is not intended to replace advice given to you by your health care provider. Make sure you discuss any questions you have with your health care provider.

## 2014-05-25 NOTE — MAU Provider Note (Signed)
History     CSN: [id number]  Arrival date and time: 05/25/14 1141 Provider on unit: 1200 Provider at bedside: 1200    Chief Complaint  Patient presents with  . Nausea  . Emesis During Pregnancy   HPI  [name] is a 33 yo G2P0010 female at 64.[redacted]wks gestation presenting with c/o N/V x 40+hrs with headache.  She has not been able to keep anything down since yesterday.  She was able to eat and drink, but everything would come back up.  She did not have any nausea yesterday, but she does today.  (+) FM.  Some uterine cramping.  Denies VB or LOF. Her primary provider at [facility] is Julianne Handler, CNM.  Past Medical History  Diagnosis Date  . Medical history non-contributory     Past Surgical History  Procedure Laterality Date  . Tonsillectomy    . Dilation and curettage of uterus      Family History  Problem Relation Age of Onset  . Heart murmur Mother   . Diabetes Mother   . Hypertension Father   . Hyperlipidemia Father   . Breast cancer Paternal Grandmother   . Hypertension Paternal Grandmother   . Hyperlipidemia Paternal Grandmother   . Heart disease Paternal Grandmother   . Hypertension Paternal Grandfather   . Heart disease Paternal Grandfather     CABG  . Heart attack Paternal Grandfather     X4  . Hyperlipidemia Paternal Grandfather     History  Substance Use Topics  . Smoking status: Former Research scientist (life sciences)  . Smokeless tobacco: Never Used  . Alcohol Use: No     Comment: socially    Allergies:  Allergies  Allergen Reactions  . Prednisone Other (See Comments)    Sharp abdominal pain    Prescriptions prior to admission  Medication Sig Dispense Refill  . ondansetron (ZOFRAN) 4 MG tablet Take 4 mg by mouth every 8 (eight) hours as needed for nausea or vomiting.      . Prenatal Vit-Fe Fumarate-FA (PRENATAL MULTIVITAMIN) TABS tablet Take 1 tablet by mouth every morning.        Review of Systems  Constitutional: Positive for malaise/fatigue.  Eyes:  Negative.   Respiratory: Negative.   Cardiovascular: Negative.   Gastrointestinal: Positive for nausea and vomiting.  Genitourinary:       Uterine cramping  Musculoskeletal: Negative.   Skin: Negative.   Neurological: Positive for headaches.  Endo/Heme/Allergies: Negative.   Psychiatric/Behavioral: Negative.    FHR: 135 / moderate variability / accels present / no decels TOCO: none  Results for orders placed during the hospital encounter of 05/25/14 (from the past 24 hour(s))  URINALYSIS, ROUTINE W REFLEX MICROSCOPIC     Status: Abnormal   Collection Time    05/25/14 11:50 AM      Result Value Ref Range   Color, Urine YELLOW  YELLOW   APPearance CLOUDY (*) CLEAR   Specific Gravity, Urine <1.005 (*) 1.005 - 1.030   pH 7.0  5.0 - 8.0   Glucose, UA NEGATIVE  NEGATIVE mg/dL   Hgb urine dipstick TRACE (*) NEGATIVE   Bilirubin Urine NEGATIVE  NEGATIVE   Ketones, ur NEGATIVE  NEGATIVE mg/dL   Protein, ur NEGATIVE  NEGATIVE mg/dL   Urobilinogen, UA 0.2  0.0 - 1.0 mg/dL   Nitrite NEGATIVE  NEGATIVE   Leukocytes, UA LARGE (*) NEGATIVE  URINE MICROSCOPIC-ADD ON     Status: Abnormal   Collection Time    05/25/14 11:50 AM  Result Value Ref Range   Squamous Epithelial / LPF MANY (*) RARE   WBC, UA 21-50  <3 WBC/hpf   Bacteria, UA MANY (*) RARE  CBC     Status: None   Collection Time    05/25/14 12:30 PM      Result Value Ref Range   WBC 7.2  4.0 - 10.5 K/uL   RBC 4.08  3.87 - 5.11 MIL/uL   Hemoglobin 12.5  12.0 - 15.0 g/dL   HCT 36.4  36.0 - 46.0 %   MCV 89.2  78.0 - 100.0 fL   MCH 30.6  26.0 - 34.0 pg   MCHC 34.3  30.0 - 36.0 g/dL   RDW 13.3  11.5 - 15.5 %   Platelets 295  150 - 400 K/uL  COMPREHENSIVE METABOLIC PANEL     Status: Abnormal   Collection Time    05/25/14 12:30 PM      Result Value Ref Range   Sodium 136 (*) 137 - 147 mEq/L   Potassium 4.3  3.7 - 5.3 mEq/L   Chloride 103  96 - 112 mEq/L   CO2 19  19 - 32 mEq/L   Glucose, Bld 79  70 - 99 mg/dL   BUN 5  (*) 6 - 23 mg/dL   Creatinine, Ser 0.48 (*) 0.50 - 1.10 mg/dL   Calcium 9.2  8.4 - 10.5 mg/dL   Total Protein 6.2  6.0 - 8.3 g/dL   Albumin 3.1 (*) 3.5 - 5.2 g/dL   AST 20  0 - 37 U/L   ALT 7  0 - 35 U/L   Alkaline Phosphatase 149 (*) 39 - 117 U/L   Total Bilirubin 0.3  0.3 - 1.2 mg/dL   GFR calc non Af Amer >90  >90 mL/min   GFR calc Af Amer >90  >90 mL/min   Anion gap 14  5 - 15   Physical Exam   Height 5\' 5"  (1.651 m), weight 84.823 kg (187 lb), last menstrual period 08/03/2013.  Physical Exam  Constitutional: She is oriented to person, place, and time. She appears well-developed and well-nourished.  HENT:  Head: Normocephalic.  Eyes: Pupils are equal, round, and reactive to light.  Neck: Normal range of motion. Neck supple.  Cardiovascular: Normal rate, regular rhythm and normal heart sounds.   Respiratory: Effort normal and breath sounds normal.  GI: Soft. Bowel sounds are normal.  Genitourinary:  Pelvic deferred  Musculoskeletal: Normal range of motion.  Neurological: She is alert and oriented to person, place, and time. She has normal reflexes.  Skin: Skin is warm and dry.  Psychiatric: She has a normal mood and affect. Her behavior is normal. Judgment and thought content normal.    MAU Course  Procedures CCUA CBC CMP D5LR with Phenergan 25 mg bolus 500 mL, then infuse @ 500 mL/hr Assessment and Plan  33 yo G2P0010 @ 37.[redacted] wks gestation Dehydration  Complete IV hydration Discharge Home  Clear fluids x 2 hrs after discharge B.R.A.T. Diet as tolerated Zofran 8 mg ODT every 8 hours prn N/V (may split in half to take 4 mg) Keep scheduled appointment Friday 05/30/2014  Laury Deep, Jerilynn Mages MSN, CNM 05/25/2014, 12:12 PM

## 2014-05-25 NOTE — MAU Note (Signed)
Pt presents to MAU with complaints of having nausea and vomiting since Friday.

## 2014-06-17 ENCOUNTER — Inpatient Hospital Stay (HOSPITAL_COMMUNITY): Payer: 59 | Admitting: Anesthesiology

## 2014-06-17 ENCOUNTER — Encounter (HOSPITAL_COMMUNITY): Payer: 59 | Admitting: Anesthesiology

## 2014-06-17 ENCOUNTER — Encounter (HOSPITAL_COMMUNITY): Payer: Self-pay | Admitting: *Deleted

## 2014-06-17 ENCOUNTER — Inpatient Hospital Stay (HOSPITAL_COMMUNITY)
Admission: AD | Admit: 2014-06-17 | Discharge: 2014-06-20 | DRG: 775 | Disposition: A | Discharge status: Home / Self Care | Payer: 59 | Source: Ambulatory Visit | Attending: Obstetrics | Admitting: Obstetrics

## 2014-06-17 DIAGNOSIS — O3660X0 Maternal care for excessive fetal growth, unspecified trimester, not applicable or unspecified: Secondary | ICD-10-CM | POA: Diagnosis present

## 2014-06-17 DIAGNOSIS — O9903 Anemia complicating the puerperium: Secondary | ICD-10-CM | POA: Diagnosis present

## 2014-06-17 DIAGNOSIS — Z803 Family history of malignant neoplasm of breast: Secondary | ICD-10-CM | POA: Diagnosis not present

## 2014-06-17 DIAGNOSIS — IMO0002 Reserved for concepts with insufficient information to code with codable children: Secondary | ICD-10-CM | POA: Diagnosis present

## 2014-06-17 DIAGNOSIS — Z87891 Personal history of nicotine dependence: Secondary | ICD-10-CM

## 2014-06-17 DIAGNOSIS — D62 Acute posthemorrhagic anemia: Secondary | ICD-10-CM | POA: Diagnosis present

## 2014-06-17 DIAGNOSIS — Z833 Family history of diabetes mellitus: Secondary | ICD-10-CM | POA: Diagnosis not present

## 2014-06-17 DIAGNOSIS — Z8249 Family history of ischemic heart disease and other diseases of the circulatory system: Secondary | ICD-10-CM | POA: Diagnosis not present

## 2014-06-17 DIAGNOSIS — O480 Post-term pregnancy: Secondary | ICD-10-CM | POA: Diagnosis present

## 2014-06-17 HISTORY — DX: Unspecified abnormal cytological findings in specimens from vagina: R87.629

## 2014-06-17 LAB — CBC
HCT: 38.8 % (ref 36.0–46.0)
Hemoglobin: 13.5 g/dL (ref 12.0–15.0)
MCH: 31.1 pg (ref 26.0–34.0)
MCHC: 34.8 g/dL (ref 30.0–36.0)
MCV: 89.4 fL (ref 78.0–100.0)
Platelets: 274 10*3/uL (ref 150–400)
RBC: 4.34 MIL/uL (ref 3.87–5.11)
RDW: 13 % (ref 11.5–15.5)
WBC: 11.7 10*3/uL — ABNORMAL HIGH (ref 4.0–10.5)

## 2014-06-17 LAB — HIV ANTIBODY (ROUTINE TESTING W REFLEX): HIV 1&2 Ab, 4th Generation: NONREACTIVE

## 2014-06-17 MED ORDER — LIDOCAINE HCL (PF) 1 % IJ SOLN
30.0000 mL | INTRAMUSCULAR | Status: AC | PRN
  Administered 2014-06-18: 30 mL via SUBCUTANEOUS
  Filled 2014-06-17: qty 30

## 2014-06-17 MED ORDER — DIPHENHYDRAMINE HCL 50 MG/ML IJ SOLN: 12.5000 mg | INTRAMUSCULAR | Status: DC | PRN

## 2014-06-17 MED ORDER — OXYTOCIN BOLUS FROM INFUSION
500.0000 mL | INTRAVENOUS | Status: DC
Start: 2014-06-17 — End: 2014-06-18

## 2014-06-17 MED ORDER — LACTATED RINGERS IV SOLN: 500.0000 mL | Freq: Once | INTRAVENOUS | Status: DC

## 2014-06-17 MED ORDER — LIDOCAINE HCL (PF) 1 % IJ SOLN
INTRAMUSCULAR | Status: DC | PRN
  Administered 2014-06-17: 10 mL

## 2014-06-17 MED ORDER — OXYTOCIN 40 UNITS IN LACTATED RINGERS INFUSION - SIMPLE MED
1.0000 m[IU]/min | INTRAVENOUS | Status: DC
  Administered 2014-06-17: 2 m[IU]/min via INTRAVENOUS
  Filled 2014-06-17: qty 1000

## 2014-06-17 MED ORDER — OXYCODONE-ACETAMINOPHEN 5-325 MG PO TABS: 2.0000 | ORAL_TABLET | ORAL | Status: DC | PRN

## 2014-06-17 MED ORDER — EPHEDRINE 5 MG/ML INJ
10.0000 mg | INTRAVENOUS | Status: DC | PRN
Start: 2014-06-17 — End: 2014-06-18
  Filled 2014-06-17: qty 2
  Filled 2014-06-17: qty 4

## 2014-06-17 MED ORDER — OXYTOCIN 40 UNITS IN LACTATED RINGERS INFUSION - SIMPLE MED: 62.5000 mL/h | INTRAVENOUS | Status: DC

## 2014-06-17 MED ORDER — TERBUTALINE SULFATE 1 MG/ML IJ SOLN: 0.2500 mg | Freq: Once | INTRAMUSCULAR | Status: AC | PRN

## 2014-06-17 MED ORDER — FENTANYL 2.5 MCG/ML BUPIVACAINE 1/10 % EPIDURAL INFUSION (WH - ANES): 14.0000 mL/h | INTRAMUSCULAR | Status: DC | PRN

## 2014-06-17 MED ORDER — EPHEDRINE 5 MG/ML INJ
10.0000 mg | INTRAVENOUS | Status: DC | PRN
  Filled 2014-06-17: qty 2

## 2014-06-17 MED ORDER — LACTATED RINGERS IV SOLN
500.0000 mL | INTRAVENOUS | Status: DC | PRN
  Administered 2014-06-17 (×3): 1000 mL via INTRAVENOUS

## 2014-06-17 MED ORDER — OXYTOCIN 10 UNIT/ML IJ SOLN: 10.0000 [IU] | Freq: Once | INTRAMUSCULAR | Status: DC

## 2014-06-17 MED ORDER — PHENYLEPHRINE 40 MCG/ML (10ML) SYRINGE FOR IV PUSH (FOR BLOOD PRESSURE SUPPORT)
80.0000 ug | PREFILLED_SYRINGE | INTRAVENOUS | Status: DC | PRN
  Filled 2014-06-17: qty 2

## 2014-06-17 MED ORDER — PHENYLEPHRINE 40 MCG/ML (10ML) SYRINGE FOR IV PUSH (FOR BLOOD PRESSURE SUPPORT)
80.0000 ug | PREFILLED_SYRINGE | INTRAVENOUS | Status: DC | PRN
  Filled 2014-06-17: qty 10
  Filled 2014-06-17: qty 2

## 2014-06-17 MED ORDER — FENTANYL CITRATE 0.05 MG/ML IJ SOLN
100.0000 ug | INTRAMUSCULAR | Status: DC | PRN
  Administered 2014-06-17: 100 ug via INTRAVENOUS
  Filled 2014-06-17 (×2): qty 2

## 2014-06-17 MED ORDER — FENTANYL 2.5 MCG/ML BUPIVACAINE 1/10 % EPIDURAL INFUSION (WH - ANES)
14.0000 mL/h | INTRAMUSCULAR | Status: DC | PRN
  Administered 2014-06-17: 14 mL/h via EPIDURAL
  Filled 2014-06-17: qty 125

## 2014-06-17 MED ORDER — CITRIC ACID-SODIUM CITRATE 334-500 MG/5ML PO SOLN: 30.0000 mL | ORAL | Status: DC | PRN

## 2014-06-17 MED ORDER — OXYCODONE-ACETAMINOPHEN 5-325 MG PO TABS: 1.0000 | ORAL_TABLET | ORAL | Status: DC | PRN

## 2014-06-17 MED ORDER — ONDANSETRON HCL 4 MG/2ML IJ SOLN
4.0000 mg | Freq: Four times a day (QID) | INTRAMUSCULAR | Status: DC | PRN
  Administered 2014-06-18: 4 mg via INTRAVENOUS
  Filled 2014-06-17: qty 2

## 2014-06-17 NOTE — H&P (Signed)
OB ADMISSION/ HISTORY & PHYSICAL:  Admission Date: 06/17/2014 11:39 AM  Admit Diagnosis: post dates pregnancy, early labor  Sharon Hartman is a 33 y.o. female presenting for painful ctx and bloody show since yesterday.  Prenatal History: G2P0010   EDC:06/09/2014, by Ultrasound   Prenatal care at [facility]  Primary Ob Provider: Julianne Handler, CNM Prenatal course complicated by LGA-EFW 7'90 (@40  wks), excess weight gain, heart palpitations w/normal cardiology work-up, persistent N/V, and h/o LEEP. Normal post date AT-normal AFI, BPP 8/8-1 wk ago.  Prenatal Labs: ABO, Rh:   B POS Antibody:  Neg Rubella:   Immune RPR:   NR HBsAg:   Neg HIV:   Neg GBS:   Neg 1 hr GTT : 108  Medical / Surgical History :  Past medical history:  Past Medical History  Diagnosis Date  . Medical history non-contributory   . Vaginal Pap smear, abnormal      Past surgical history:  Past Surgical History  Procedure Laterality Date  . Tonsillectomy    . Dilation and curettage of uterus    . Leep      Family History:  Family History  Problem Relation Age of Onset  . Heart murmur Mother   . Diabetes Mother   . Hypertension Father   . Hyperlipidemia Father   . Breast cancer Paternal Grandmother   . Hypertension Paternal Grandmother   . Hyperlipidemia Paternal Grandmother   . Heart disease Paternal Grandmother   . Hypertension Paternal Grandfather   . Heart disease Paternal Grandfather     CABG  . Heart attack Paternal Grandfather     X4  . Hyperlipidemia Paternal Grandfather      Social History:  reports that she has quit smoking. She has never used smokeless tobacco. She reports that she does not drink alcohol or use illicit drugs.  Allergies: Prednisone   Current Medications at time of admission:  Prior to Admission medications   Medication Sig Start Date End Date Taking? Authorizing Provider  Evening Primrose Oil 500 MG CAPS Take 1 capsule by mouth 2 (two)  times daily.   Yes Historical Provider, MD  Evening Primrose Oil 500 MG CAPS Place 1 capsule vaginally at bedtime.   Yes Historical Provider, MD  ondansetron (ZOFRAN ODT) 8 MG disintegrating tablet Take 1 tablet (8 mg total) by mouth every 8 (eight) hours as needed for nausea or vomiting. 05/25/14  Yes Laury Deep, CNM  OVER THE COUNTER MEDICATION Take 1 capsule by mouth daily. Pt takes Blue Cohosh (500mg ) twice a day.   Yes Historical Provider, MD  Prenatal Vit-Fe Fumarate-FA (PRENATAL MULTIVITAMIN) TABS tablet Take 1 tablet by mouth every morning.   Yes Historical Provider, MD     Review of Systems: +FM-decreased today +bloody show +mucous discharge +ctx, irregular today No LOF   Physical Exam:  VS: Blood pressure 122/81, pulse 118, temperature 97.9 F (36.6 C), temperature source Oral, resp. rate 18, last menstrual period 08/03/2013, SpO2 96.00%.  General: alert and oriented, appears comfortable Heart: RRR Lungs: Clear lung fields Abdomen: Gravid, soft and non-tender, non-distended / uterus: gravid, non-tender Extremities: No edema Genitalia / VE: 3/70/-2; EFW: 8 lbs FHR: baseline rate 135 / variability mod / accelerations present / no decelerations TOCO: q3-6, mild  Assessment: 41.[redacted] weeks gestation First stage of labor FHR category I Suspected LGA  Plan:  Admit, Pitocin augmentation, continuous EFM, analgesia/anesthesia prn, anticipate SVD. Dr. Pamala Hurry notified of admission / plan of care   [facility], [place],  N MSN, CNM 06/17/2014, 12:56 PM

## 2014-06-17 NOTE — Progress Notes (Signed)
Subjective:   Uncomfortable with ctx, recent dose of Fentanyl, some relief.  Objective:   VS: Blood pressure 122/60, pulse 92, temperature 98.3 F (36.8 C), temperature source Oral, resp. rate 18, height 5\' 6"  (1.676 m), weight 86.183 kg (190 lb), last menstrual period 08/03/2013, SpO2 96.00%. FHR : baseline 135 / variability mod / accelerations present / mild variable and early decelerations Toco: contractions every 2-3 minutes Cervix : 5/90/-1 Membranes: clear Pitocin: 32mu/min MVU: 160  Assessment:  Labor: first stage, active FHR category II  Plan:  Discussed with pt and spouse recommendation for AROM and IUPC placement to assess labor adequacy. Consent obtained. Plan epidural for pain management followed by titration of Pitocin to achieve adequate ctx. Dr. Pamala Hurry updated with A/P.      Julianne Handler, N MSN, CNM 06/17/2014, 10:08 PM

## 2014-06-17 NOTE — Anesthesia Procedure Notes (Signed)

## 2014-06-17 NOTE — Progress Notes (Signed)
Subjective:   Feeling some painful ctx. Declines pain meds.  Objective:   VS: Blood pressure 122/71, pulse 76, temperature 98.1 F (36.7 C), temperature source Oral, resp. rate 18, height 5\' 6"  (1.676 m), weight 86.183 kg (190 lb), last menstrual period 08/03/2013, SpO2 96.00%. FHR: baseline 145 / variability mod / accelerations present / no decelerations Toco: contractions every 2-3 minutes Cervix : deferred Membranes: intact Pitocin: 4 mu/min  Assessment:  Labor: first stage, latent FHR category I  Plan:  Discussed with pt and spouse risks and complications with LGA: prolonged labor, protracted/dysfunctional labor, shoulder dystocia, and/or need for CS. Verbalizes understanding, agrees to accept risks, and continue trial of labor. Continue Pitocin augmentation. Analgesia/anesthesia prn. Recommend position changes, upright positions, and maintain good po hydration. Anticipate spontaneous labor progression.     Julianne Handler, N MSN, CNM 06/17/2014, 4:45 PM

## 2014-06-17 NOTE — Anesthesia Preprocedure Evaluation (Signed)

## 2014-06-17 NOTE — MAU Note (Signed)
Bloody mucous yesterday, turned into frank bleeding. Then started having watery discharge.  Every time she would sit down would have a small gush of water.  Contractions every 5 min.

## 2014-06-18 ENCOUNTER — Encounter (HOSPITAL_COMMUNITY): Payer: Self-pay | Admitting: *Deleted

## 2014-06-18 LAB — RPR

## 2014-06-18 MED ORDER — DIBUCAINE 1 % RE OINT: 1.0000 "application " | TOPICAL_OINTMENT | RECTAL | Status: DC | PRN

## 2014-06-18 MED ORDER — OXYCODONE-ACETAMINOPHEN 5-325 MG PO TABS: 2.0000 | ORAL_TABLET | ORAL | Status: DC | PRN

## 2014-06-18 MED ORDER — PRENATAL MULTIVITAMIN CH
1.0000 | ORAL_TABLET | Freq: Every day | ORAL | Status: DC
  Administered 2014-06-18 – 2014-06-20 (×3): 1 via ORAL
  Filled 2014-06-18 (×3): qty 1

## 2014-06-18 MED ORDER — ONDANSETRON HCL 4 MG/2ML IJ SOLN: 4.0000 mg | INTRAMUSCULAR | Status: DC | PRN

## 2014-06-18 MED ORDER — LANOLIN HYDROUS EX OINT: TOPICAL_OINTMENT | CUTANEOUS | Status: DC | PRN

## 2014-06-18 MED ORDER — DIPHENHYDRAMINE HCL 25 MG PO CAPS: 25.0000 mg | ORAL_CAPSULE | Freq: Four times a day (QID) | ORAL | Status: DC | PRN

## 2014-06-18 MED ORDER — SENNOSIDES-DOCUSATE SODIUM 8.6-50 MG PO TABS
2.0000 | ORAL_TABLET | ORAL | Status: DC
  Administered 2014-06-18 – 2014-06-20 (×2): 2 via ORAL
  Filled 2014-06-18 (×2): qty 2

## 2014-06-18 MED ORDER — IBUPROFEN 600 MG PO TABS
600.0000 mg | ORAL_TABLET | Freq: Four times a day (QID) | ORAL | Status: DC
  Administered 2014-06-18 – 2014-06-20 (×10): 600 mg via ORAL
  Filled 2014-06-18 (×9): qty 1

## 2014-06-18 MED ORDER — ONDANSETRON HCL 4 MG PO TABS
4.0000 mg | ORAL_TABLET | ORAL | Status: DC | PRN
Start: 2014-06-18 — End: 2014-06-20

## 2014-06-18 MED ORDER — OXYCODONE-ACETAMINOPHEN 5-325 MG PO TABS
1.0000 | ORAL_TABLET | ORAL | Status: DC | PRN
  Administered 2014-06-18 – 2014-06-20 (×8): 1 via ORAL
  Filled 2014-06-18 (×8): qty 1

## 2014-06-18 MED ORDER — WITCH HAZEL-GLYCERIN EX PADS
1.0000 "application " | MEDICATED_PAD | CUTANEOUS | Status: DC | PRN
  Administered 2014-06-18: 1 via TOPICAL

## 2014-06-18 MED ORDER — SIMETHICONE 80 MG PO CHEW: 80.0000 mg | CHEWABLE_TABLET | ORAL | Status: DC | PRN

## 2014-06-18 MED ORDER — TETANUS-DIPHTH-ACELL PERTUSSIS 5-2.5-18.5 LF-MCG/0.5 IM SUSP: 0.5000 mL | Freq: Once | INTRAMUSCULAR | Status: DC

## 2014-06-18 MED ORDER — BENZOCAINE-MENTHOL 20-0.5 % EX AERO: 1.0000 "application " | INHALATION_SPRAY | CUTANEOUS | Status: DC | PRN

## 2014-06-18 MED ORDER — INFLUENZA VAC SPLIT QUAD 0.5 ML IM SUSY
0.5000 mL | PREFILLED_SYRINGE | INTRAMUSCULAR | Status: DC
  Filled 2014-06-18: qty 0.5

## 2014-06-18 NOTE — Anesthesia Postprocedure Evaluation (Signed)
Anesthesia Post Note  Patient: Sharon Hartman  Procedure(s) Performed: * No procedures listed *  Anesthesia type: Epidural  Patient location: Mother/Baby  Post pain: Pain level controlled  Post assessment: Post-op Vital signs reviewed  Last Vitals:  Filed Vitals:   06/18/14 0800  BP: 109/53  Pulse: 89  Temp: 37 C  Resp: 18    Post vital signs: Reviewed  Level of consciousness:alert  Complications: No apparent anesthesia complications

## 2014-06-18 NOTE — Progress Notes (Signed)
Subjective:   To bedside for FHR decel. Pt reports increased fetal activity just prior.  Objective:   VS: Blood pressure 121/65, pulse 87, temperature 98.4 F (36.9 C), temperature source Oral, resp. rate 18, height 5\' 6"  (1.676 m), weight 86.183 kg (190 lb), last menstrual period 08/03/2013, SpO2 99.00%. FHR : baseline 135 / variability mod / accelerations present / prolonged decelerations Toco: contractions every 1-3 minutes Cervix : 6/90/-1, LOT Membranes: clear Pitocin: 10 MVU: 240  Assessment:  Labor: first stage, active-protracted FHR category II-prolonged FHR decel x4 min with nadir in 50s, quick resolution after maternal position change, IVF bolus, o2, and Pitocin d/c.  Plan:  Short interval of adequate ctx prior to decel. FHR decel likely cord compression and/or tachysystole. Plan Pitocin break x15 min and restart at half dose, titrate to adequate labor and fetal tolerance. Peanut ball to aide fetal rotation and descent. Cautious approach to SVD.     Julianne Handler, N MSN, CNM 06/18/2014, 1:34 AM

## 2014-06-19 LAB — CBC
HCT: 28.6 % — ABNORMAL LOW (ref 36.0–46.0)
Hemoglobin: 9.8 g/dL — ABNORMAL LOW (ref 12.0–15.0)
MCH: 30.9 pg (ref 26.0–34.0)
MCHC: 34.3 g/dL (ref 30.0–36.0)
MCV: 90.2 fL (ref 78.0–100.0)
Platelets: 212 10*3/uL (ref 150–400)
RBC: 3.17 MIL/uL — ABNORMAL LOW (ref 3.87–5.11)
RDW: 13.2 % (ref 11.5–15.5)
WBC: 11.7 10*3/uL — ABNORMAL HIGH (ref 4.0–10.5)

## 2014-06-19 MED ORDER — POLYSACCHARIDE IRON COMPLEX 150 MG PO CAPS
150.0000 mg | ORAL_CAPSULE | Freq: Two times a day (BID) | ORAL | Status: DC
  Administered 2014-06-19 – 2014-06-20 (×2): 150 mg via ORAL
  Filled 2014-06-19 (×2): qty 1

## 2014-06-19 MED ORDER — MAGNESIUM OXIDE 400 (241.3 MG) MG PO TABS
200.0000 mg | ORAL_TABLET | Freq: Every day | ORAL | Status: DC
  Administered 2014-06-19 – 2014-06-20 (×2): 200 mg via ORAL
  Filled 2014-06-19 (×3): qty 0.5

## 2014-06-19 NOTE — Lactation Note (Signed)
This note was copied from the chart of [place]. Lactation Consultation Note  Patient Name: Sharon Hartman SELTR'V Date: 06/19/2014 Reason for consult: Follow-up assessment Baby 34 hours of life. Mom asking for [facility] to make sure breastfeeding going well. Assisted mom to latch baby to right breast, but baby was circumcised earlier and too sleepy to latch. Mom able to return-demonstrate hand expression with colostrum visible. LC placed gloved finger in baby's mouth, demonstrated to mom how to tug baby's chin to flange lower lip outward. Mom has slight bruising on right nipple. Enc mom to make sure to latch deeply, and to ask for assistance as needed. Discussed that maintaining a deep latch is best way to prevent nipple damage/pain and promote colostrum/milk transfer. Discussed cluster feeding. Enc mom to offer lots of STS, nurse with cues and at least 8-12 times/24 hours.   Maternal Data    Feeding Feeding Type: Breast Fed  LATCH Score/Interventions Latch: Too sleepy or reluctant, no latch achieved, no sucking elicited. Intervention(s): Skin to skin  Audible Swallowing: None  Type of Nipple: Everted at rest and after stimulation  Comfort (Breast/Nipple): Soft / non-tender     Hold (Positioning): No assistance needed to correctly position infant at breast. Intervention(s): Breastfeeding basics reviewed;Support Pillows  LATCH Score: 6  Lactation Tools Discussed/Used     Consult Status Consult Status: Follow-up Date: 06/20/14 Follow-up type: In-patient    Sharon Hartman, Sharon Hartman 06/19/2014, 3:13 PM

## 2014-06-19 NOTE — Progress Notes (Signed)
PPD #1- SVD  Subjective:   Reports feeling perineal and tailbone soreness Tolerating po/ No nausea or vomiting Bleeding is light Pain controlled with Motrin and Percocet Up ad lib / ambulatory / voiding without problems Newborn: breastfeeding  / Circumcision: planning   Objective:   VS:  VS:  Filed Vitals:   06/18/14 0800 06/18/14 1210 06/18/14 1759 06/19/14 0650  BP: 109/53 105/57 112/60 91/48  Pulse: 89 85 79 63  Temp: 98.6 F (37 C) 98.4 F (36.9 C) 97.6 F (36.4 C) 97.8 F (36.6 C)  TempSrc: Oral Oral Oral Oral  Resp: 18 18 18 16   Height:      Weight:      SpO2:        LABS:  Recent Labs  06/17/14 1350 06/19/14 0605  WBC 11.7* 11.7*  HGB 13.5 9.8*  PLT 274 212   Blood type: B/Positive/-- (06/08 0000) Rubella: Immune (06/08 0000)   I&O: Intake/Output     09/09 0701 - 09/10 0700 09/10 0701 - 09/11 0700   Blood     Total Output       Net            Urine Occurrence 1 x      Physical Exam: Alert and oriented x3 Abdomen: soft, non-tender, non-distended  Fundus: firm, non-tender, U-1 Perineum: Well approximated, no significant erythema, edema, or drainage; healing well. Lochia: small Extremities: No edema, no calf pain or tenderness    Assessment:  PPD # 1G2P1011/ S/P:spontaneous vaginal, 2nd degree laceration, bilateral labial lacerations ABL anemia  Doing well    Plan: Continue routine post partum orders Anticipate D/C home tomorrow Follow-up in office in 2 weeks for mood eval (hx of depression)   Rolin Schult, N MSN, CNM 06/19/2014, 11:57 AM

## 2014-06-20 MED ORDER — MAGNESIUM OXIDE 400 (241.3 MG) MG PO TABS: 200.0000 mg | ORAL_TABLET | Freq: Every day | ORAL | Status: DC

## 2014-06-20 MED ORDER — IBUPROFEN 600 MG PO TABS: 600.0000 mg | ORAL_TABLET | Freq: Four times a day (QID) | ORAL | Status: DC

## 2014-06-20 MED ORDER — IRON 325 (65 FE) MG PO TABS: 1.0000 | ORAL_TABLET | Freq: Two times a day (BID) | ORAL | Status: DC

## 2014-06-20 NOTE — Progress Notes (Signed)
PPD #2 - SVD  Subjective:   Reports feeling better, perineal and tailbone soreness improving Tolerating po/ No nausea or vomiting Bleeding is light Pain controlled with Motrin and Percocet Up ad lib / ambulatory / voiding without problems Newborn: breastfeeding  / Circumcision: done   Objective:   VS:  VS:  Filed Vitals:   06/18/14 1759 06/19/14 0650 06/19/14 1753 06/20/14 0606  BP: 112/60 91/48 120/53 111/59  Pulse: 79 63 80 71  Temp: 97.6 F (36.4 C) 97.8 F (36.6 C) 98.3 F (36.8 C) 98.4 F (36.9 C)  TempSrc: Oral Oral Oral Axillary  Resp: 18 16 18 18   Height:      Weight:      SpO2:        LABS:   Recent Labs  06/17/14 1350 06/19/14 0605  WBC 11.7* 11.7*  HGB 13.5 9.8*  PLT 274 212   Blood type: B/Positive/-- (06/08 0000) Rubella: Immune (06/08 0000)   I&O: Intake/Output   None     Physical Exam: Alert and oriented x3 Abdomen: soft, non-tender, non-distended  Fundus: firm, non-tender, U-2 Perineum: Well approximated, no significant erythema, edema, or drainage; healing well. Lochia: small Extremities: No edema, no calf pain or tenderness    Assessment:  PPD # 2 / [id number] / S/P:spontaneous vaginal, 2nd degree laceration, bilateral labial lacerations ABL anemia   Doing well    Plan: Continue routine post partum orders D/C home today Follow-up in office in 2 weeks for PPD eval (hx of depression)   Graceann Congress MSN, CNM 06/20/2014, 10:32 AM

## 2014-06-20 NOTE — Lactation Note (Addendum)
This note was copied from the chart of [place]. Lactation Consultation Note Baby had 8% wt. Loss. Circumcised today and missed a couple of feedings. Now is cluster feeding. Mom has 2 red bruises to Lt. Nipple. Lt. Nipple not as everted as Rt. Needs stimulation, encouraged to pre-pump or roll nipples in finger tips to evert nipples more for a deeper latch. Discussed milk transfer and latch poisitions. Baby having good I&O. Baby gets SOB during feedings. Increased respirations resolve after feeding. No stuffiness noted to nose, has good airway clearance at breast. Discussed feeding positions for optimal breathing comfort. Notified Patty RN in nursery about increased respirations during feedings to pass on to Peds. Dr. Pierre Bali states baby has been this way and is getting better. Having good color during feedings. Heard audible swallows. Discussed engorgement/prevention/treatment. Mom and dad had several questions and answered them. Has shells and encouraged to wear in bra between feedings d/t short shafts. Baby can latch well. Comfort gels given for soreness. Patient Name: Sharon Hartman Date: 06/20/2014 Reason for consult: Follow-up assessment;Infant weight loss   Maternal Data    Feeding Feeding Type: Breast Fed Length of feed: 15 min  LATCH Score/Interventions Latch: Grasps breast easily, tongue down, lips flanged, rhythmical sucking. Intervention(s): Skin to skin;Teach feeding cues;Waking techniques Intervention(s): Adjust position;Assist with latch;Breast massage;Breast compression  Audible Swallowing: Spontaneous and intermittent Intervention(s): Skin to skin;Hand expression  Type of Nipple: Everted at rest and after stimulation  Comfort (Breast/Nipple): Filling, red/small blisters or bruises, mild/mod discomfort  Problem noted: Mild/Moderate discomfort;Filling Interventions (Filling): Hand pump;Massage Interventions (Mild/moderate discomfort): Comfort gels;Hand  expression  Hold (Positioning): Assistance needed to correctly position infant at breast and maintain latch. Intervention(s): Support Pillows;Breastfeeding basics reviewed;Position options;Skin to skin  LATCH Score: 8  Lactation Tools Discussed/Used Tools: Shells;Comfort gels;Pump Pump Review: Setup, frequency, and cleaning;Milk Storage Initiated by:: RN Date initiated:: 06/19/14   Consult Status Consult Status: Follow-up Date: 06/20/14 Follow-up type: In-patient    Theodoro Kalata 06/20/2014, 4:14 AM

## 2014-06-20 NOTE — Discharge Instructions (Signed)
Nutrition for the New Mother  °A new mother needs good health and nutrition so she can have energy to take care of a new baby. Whether a mother breastfeeds or formula feeds the baby, it is important to have a well-balanced diet. Foods from all the food groups should be chosen to meet the new mother's energy needs and to give her the nutrients needed for repair and healing.  °A HEALTHY EATING PLAN °The My Pyramid plan for Moms outlines what you should eat to help you and your baby stay healthy. The energy and amount of food you need depends on whether or not you are breastfeeding. If you are breastfeeding you will need more nutrients. If you choose not to breastfeed, your nutrition goal should be to return to a healthy weight. Limiting calories may be needed if you are not breastfeeding.  °HOME CARE INSTRUCTIONS  °· For a personal plan based on your unique needs, see your Registered Dietitian or visit www.mypyramid.gov. °· Eat a variety of foods. The plan below will help guide you. The following chart has a suggested daily meal plan from the My Pyramid for Moms. °· Eat a variety of fruits and vegetables. °· Eat more dark green and orange vegetables and cooked dried beans. °· Make half your grains whole grains. Choose whole instead of refined grains. °· Choose low-fat or lean meats and poultry. °· Choose low-fat or fat-free dairy products like milk, cheese, or yogurt. °Fruits °· Breastfeeding: 2 cups °· Non-Breastfeeding: 2 cups °· What Counts as a serving? °¨ 1 cup of fruit or juice. °¨ ½ cup dried fruit. °Vegetables °· Breastfeeding: 3 cups °· Non-Breastfeeding: 2 ½ cups °· What Counts as a serving? °¨ 1 cup raw or cooked vegetables. °¨ Juice or 2 cups raw leafy vegetables. °Grains °· Breastfeeding: 8 oz °· Non-Breastfeeding: 6 oz °· What Counts as a serving? °¨ 1 slice bread. °¨ 1 oz ready-to-eat cereal. °¨ ½ cup cooked pasta, rice, or cereal. °Meat and Beans °· Breastfeeding: 6 ½ oz °· Non-Breastfeeding: 5 ½  oz °· What Counts as a serving? °¨ 1 oz lean meat, poultry, or fish °¨ ¼ cup cooked dry beans °¨ ½ oz nuts or 1 egg °¨ 1 tbs peanut butter °Milk °· Breastfeeding: 3 cups °· Non-Breastfeeding: 3 cups °· What Counts as a serving? °¨ 1 cup milk. °¨ 8 oz yogurt. °¨ 1 ½ oz cheese. °¨ 2 oz processed cheese. °TIPS FOR THE BREASTFEEDING MOM °· Rapid weight loss is not suggested when you are breastfeeding. By simply breastfeeding, you will be able to lose the weight gained during your pregnancy. Your caregiver can keep track of your weight and tell you if your weight loss is appropriate. °· Be sure to drink fluids. You may notice that you are thirstier than usual. A suggestion is to drink a glass of water or other beverage whenever you breastfeed. °· Avoid alcohol as it can be passed into your breast milk. °· Limit caffeine drinks to no more than 2 to 3 cups per day. °· You may need to keep taking your prenatal vitamin while you are breastfeeding. Talk with your caregiver about taking a vitamin or supplement. °RETURING TO A HEALTHY WEIGHT °· The My Pyramid Plan for Moms will help you return to a healthy weight. It will also provide the nutrients you need. °· You may need to limit "empty" calories. These include: °¨ High fat foods like fried foods, fatty meats, fast food, butter, and mayonnaise. °¨ High   sugar foods like sodas, jelly, candy, and sweets.  Be physically active. Include 30 minutes of exercise or more each day. Choose an activity you like such as walking, swimming, biking, or aerobics. Check with your caregiver before you start to exercise. Document Released: 01/03/2008 Document Revised: 12/19/2011 Document Reviewed: 01/03/2008 [facility] Patient Information 2015 [place], [place]. This information is not intended to replace advice given to you by your health care provider. Make sure you discuss any questions you have with your health care provider. Breastfeeding and Mastitis Mastitis is inflammation of the  breast tissue. It can occur in women who are breastfeeding. This can make breastfeeding painful. Mastitis will sometimes go away on its own. Your health care provider will help determine if treatment is needed. CAUSES Mastitis is often associated with a blocked milk (lactiferous) duct. This can happen when too much milk builds up in the breast. Causes of excess milk in the breast can include:  Poor latch-on. If your baby is not latched onto the breast properly, she or he may not empty your breast completely while breastfeeding.  Allowing too much time to pass between feedings.  Wearing a bra or other clothing that is too tight. This puts extra pressure on the lactiferous ducts so milk does not flow through them as it should. Mastitis can also be caused by a bacterial infection. Bacteria may enter the breast tissue through cuts or openings in the skin. In women who are breastfeeding, this may occur because of cracked or irritated skin. Cracks in the skin are often caused when your baby does not latch on properly to the breast. SIGNS AND SYMPTOMS  Swelling, redness, tenderness, and pain in an area of the breast.  Swelling of the glands under the arm on the same side.  Fever may or may not accompany mastitis. If an infection is allowed to progress, a collection of pus (abscess) may develop. DIAGNOSIS  Your health care provider can usually diagnose mastitis based on your symptoms and a physical exam. Tests may be done to help confirm the diagnosis. These may include:  Removal of pus from the breast by applying pressure to the area. This pus can be examined in the lab to determine which bacteria are present. If an abscess has developed, the fluid in the abscess can be removed with a needle. This can also be used to confirm the diagnosis and determine the bacteria present. In most cases, pus will not be present.  Blood tests to determine if your body is fighting a bacterial infection.  Mammogram  or ultrasound tests to rule out other problems or diseases. TREATMENT  Mastitis that occurs with breastfeeding will sometimes go away on its own. Your health care provider may choose to wait 24 hours after first seeing you to decide whether a prescription medicine is needed. If your symptoms are worse after 24 hours, your health care provider will likely prescribe an antibiotic medicine to treat the mastitis. He or she will determine which bacteria are most likely causing the infection and will then select an appropriate antibiotic medicine. This is sometimes changed based on the results of tests performed to identify the bacteria, or if there is no response to the antibiotic medicine selected. Antibiotic medicines are usually given by mouth. You may also be given medicine for pain. HOME CARE INSTRUCTIONS  Only take over-the-counter or prescription medicines for pain, fever, or discomfort as directed by your health care provider.  If your health care provider prescribed an antibiotic medicine, take  the medicine as directed. Make sure you finish it even if you start to feel better.  Do not wear a tight or underwire bra. Wear a soft, supportive bra.  Increase your fluid intake, especially if you have a fever.  Continue to empty the breast. Your health care provider can tell you whether this milk is safe for your infant or needs to be thrown out. You may be told to stop nursing until your health care provider thinks it is safe for your baby. Use a breast pump if you are advised to stop nursing.  Keep your nipples clean and dry.  Empty the first breast completely before going to the other breast. If your baby is not emptying your breasts completely for some reason, use a breast pump to empty your breasts.  If you go back to work, pump your breasts while at work to stay in time with your nursing schedule.  Avoid allowing your breasts to become overly filled with milk (engorged). SEEK MEDICAL CARE  IF:  You have pus-like discharge from the breast.  Your symptoms do not improve with the treatment prescribed by your health care provider within 2 days. SEEK IMMEDIATE MEDICAL CARE IF:  Your pain and swelling are getting worse.  You have pain that is not controlled with medicine.  You have a red line extending from the breast toward your armpit.  You have a fever or persistent symptoms for more than 2-3 days.  You have a fever and your symptoms suddenly get worse. MAKE SURE YOU:   Understand these instructions.  Will watch your condition.  Will get help right away if you are not doing well or get worse. Document Released: 01/21/2005 Document Revised: 10/01/2013 Document Reviewed: 05/02/2013 [facility] Patient Information 2015 [place], [place]. This information is not intended to replace advice given to you by your health care provider. Make sure you discuss any questions you have with your health care provider. Breast Pumping Tips If you are breastfeeding, there may be times when you cannot feed your baby directly. Returning to work or going on a trip are common examples. Pumping allows you to store breast milk and feed it to your baby later.  You may not get much milk when you first start to pump. Your breasts should start to make more after a few days. If you pump at the times you usually feed your baby, you may be able to keep making enough milk to feed your baby without also using formula. The more often you pump, the more milk you will produce. WHEN SHOULD I PUMP?   You can begin to pump soon after delivery. However, some experts recommend waiting about 4 weeks before giving your infant a bottle to make sure breastfeeding is going well.  If you plan to return to work, begin pumping a few weeks before. This will help you develop techniques that work best for you. It also lets you build up a supply of breast milk.   When you are with your infant, feed on demand and pump after  each feeding.   When you are away from your infant for several hours, pump for about 15 minutes every 2-3 hours. Pump both breasts at the same time if you can.   If your infant has a formula feeding, make sure to pump around the same time.   If you drink any alcohol, wait 2 hours before pumping.  HOW DO I PREPARE TO PUMP? Your let-down reflexis the natural reaction to  stimulation that makes your breast milk flow. It is easier to stimulate this reflex when you are relaxed. Find relaxation techniques that work for you. If you have difficulty with your let-down reflex, try these methods:   Smell one of your infant's blankets or an item of clothing.   Look at a picture or video of your infant.   Sit in a quiet, private space.   Massage the breast you plan to pump.   Place soothing warmth on the breast.   Play relaxing music.  WHAT ARE SOME GENERAL BREAST PUMPING TIPS?  Wash your hands before you pump. You do not need to wash your nipples or breasts.  There are three ways to pump.  You can use your hand to massage and compress your breast.  You can use a handheld manual pump.  You can use an electric pump.   Make sure the suction cup (flange) on the breast pump is the right size. Place the flange directly over the nipple. If it is the wrong size or placed the wrong way, it may be painful and cause nipple damage.   If pumping is uncomfortable, apply a small amount of purified or modified lanolin to your nipple and areola.  If you are using an electric pump, adjust the speed and suction power to be more comfortable.  If pumping is painful or if you are not getting very much milk, you may need a different type of pump. A lactation consultant can help you determine what type of pump to use.   Keep a full water bottle near you at all times. Drinking lots of fluid helps you make more milk.  You can store your milk to use later. Pumped breast milk can be stored in a  sealable, sterile container or plastic bag. Label all stored breast milk with the date you pumped it.  Milk can stay out at room temperature for up to 8 hours.  You can store your milk in the refrigerator for up to 8 days.  You can store your milk in the freezer for 3 months. Thaw frozen milk using warm water. Do not put it in the microwave.  Do not smoke. Smoking can lower your milk supply and harm your infant. If you need help quitting, ask your health care provider to recommend a program.  [place] A LACTATION CONSULTANT?  You are having trouble pumping.  You are concerned that you are not making enough milk.  You have nipple pain, soreness, or redness.  You want to use birth control. Birth control pills may lower your milk supply. Talk to your health care provider about your options. Document Released: 03/16/2010 Document Revised: 10/01/2013 Document Reviewed: 07/19/2013 [facility] Patient Information 2015 [place], [place]. This information is not intended to replace advice given to you by your health care provider. Make sure you discuss any questions you have with your health care provider. Breastfeeding Deciding to breastfeed is one of the best choices you can make for you and your baby. A change in hormones during pregnancy causes your breast tissue to grow and increases the number and size of your milk ducts. These hormones also allow proteins, sugars, and fats from your blood supply to make breast milk in your milk-producing glands. Hormones prevent breast milk from being released before your baby is born as well as prompt milk flow after birth. Once breastfeeding has begun, thoughts of your baby, as well as his or her  sucking or crying, can stimulate the release of milk from your milk-producing glands.  BENEFITS OF BREASTFEEDING For Your Baby  Your first milk (colostrum) helps your baby's digestive system function better.   There are antibodies  in your milk that help your baby fight off infections.   Your baby has a lower incidence of asthma, allergies, and sudden infant death syndrome.   The nutrients in breast milk are better for your baby than infant formulas and are designed uniquely for your baby's needs.   Breast milk improves your baby's brain development.   Your baby is less likely to develop other conditions, such as childhood obesity, asthma, or type 2 diabetes mellitus.  For You   Breastfeeding helps to create a very special bond between you and your baby.   Breastfeeding is convenient. Breast milk is always available at the correct temperature and costs nothing.   Breastfeeding helps to burn calories and helps you lose the weight gained during pregnancy.   Breastfeeding makes your uterus contract to its prepregnancy size faster and slows bleeding (lochia) after you give birth.   Breastfeeding helps to lower your risk of developing type 2 diabetes mellitus, osteoporosis, and breast or ovarian cancer later in life. SIGNS THAT YOUR BABY IS HUNGRY Early Signs of Hunger  Increased alertness or activity.  Stretching.  Movement of the head from side to side.  Movement of the head and opening of the mouth when the corner of the mouth or cheek is stroked (rooting).  Increased sucking sounds, smacking lips, cooing, sighing, or squeaking.  Hand-to-mouth movements.  Increased sucking of fingers or hands. Late Signs of Hunger  Fussing.  Intermittent crying. Extreme Signs of Hunger Signs of extreme hunger will require calming and consoling before your baby will be able to breastfeed successfully. Do not wait for the following signs of extreme hunger to occur before you initiate breastfeeding:   Restlessness.  A loud, strong cry.   Screaming. BREASTFEEDING BASICS Breastfeeding Initiation  Find a comfortable place to sit or lie down, with your neck and back well supported.  Place a pillow or  rolled up blanket under your baby to bring him or her to the level of your breast (if you are seated). Nursing pillows are specially designed to help support your arms and your baby while you breastfeed.  Make sure that your baby's abdomen is facing your abdomen.   Gently massage your breast. With your fingertips, massage from your chest wall toward your nipple in a circular motion. This encourages milk flow. You may need to continue this action during the feeding if your milk flows slowly.  Support your breast with 4 fingers underneath and your thumb above your nipple. Make sure your fingers are well away from your nipple and your baby's mouth.   Stroke your baby's lips gently with your finger or nipple.   When your baby's mouth is open wide enough, quickly bring your baby to your breast, placing your entire nipple and as much of the colored area around your nipple (areola) as possible into your baby's mouth.   More areola should be visible above your baby's upper lip than below the lower lip.   Your baby's tongue should be between his or her lower gum and your breast.   Ensure that your baby's mouth is correctly positioned around your nipple (latched). Your baby's lips should create a seal on your breast and be turned out (everted).  It is common for your baby to suck  about 2-3 minutes in order to start the flow of breast milk. Latching Teaching your baby how to latch on to your breast properly is very important. An improper latch can cause nipple pain and decreased milk supply for you and poor weight gain in your baby. Also, if your baby is not latched onto your nipple properly, he or she may swallow some air during feeding. This can make your baby fussy. Burping your baby when you switch breasts during the feeding can help to get rid of the air. However, teaching your baby to latch on properly is still the best way to prevent fussiness from swallowing air while breastfeeding. Signs  that your baby has successfully latched on to your nipple:    Silent tugging or silent sucking, without causing you pain.   Swallowing heard between every 3-4 sucks.    Muscle movement above and in front of his or her ears while sucking.  Signs that your baby has not successfully latched on to nipple:   Sucking sounds or smacking sounds from your baby while breastfeeding.  Nipple pain. If you think your baby has not latched on correctly, slip your finger into the corner of your baby's mouth to break the suction and place it between your baby's gums. Attempt breastfeeding initiation again. Signs of Successful Breastfeeding Signs from your baby:   A gradual decrease in the number of sucks or complete cessation of sucking.   Falling asleep.   Relaxation of his or her body.   Retention of a small amount of milk in his or her mouth.   Letting go of your breast by himself or herself. Signs from you:  Breasts that have increased in firmness, weight, and size 1-3 hours after feeding.   Breasts that are softer immediately after breastfeeding.  Increased milk volume, as well as a change in milk consistency and color by the fifth day of breastfeeding.   Nipples that are not sore, cracked, or bleeding. Signs That Your [place] is Getting Enough Milk  Wetting at least 3 diapers in a 24-hour period. The urine should be clear and pale yellow by age 80 days.  At least 3 stools in a 24-hour period by age 80 days. The stool should be soft and yellow.  At least 3 stools in a 24-hour period by age 30 days. The stool should be seedy and yellow.  No loss of weight greater than 10% of birth weight during the first 76 days of age.  Average weight gain of 4-7 ounces (113-198 g) per week after age 25 days.  Consistent daily weight gain by age 805 days, without weight loss after the age of 2 weeks. After a feeding, your baby may spit up a small amount. This is common. BREASTFEEDING FREQUENCY AND  DURATION Frequent feeding will help you make more milk and can prevent sore nipples and breast engorgement. Breastfeed when you feel the need to reduce the fullness of your breasts or when your baby shows signs of hunger. This is called "breastfeeding on demand." Avoid introducing a pacifier to your baby while you are working to establish breastfeeding (the first 4-6 weeks after your baby is born). After this time you may choose to use a pacifier. Research has shown that pacifier use during the first year of a baby's life decreases the risk of sudden infant death syndrome (SIDS). Allow your baby to feed on each breast as long as he or she wants. Breastfeed until your baby is finished feeding. When  your baby unlatches or falls asleep while feeding from the first breast, offer the second breast. Because newborns are often sleepy in the first few weeks of life, you may need to awaken your baby to get him or her to feed. Breastfeeding times will vary from baby to baby. However, the following rules can serve as a guide to help you ensure that your baby is properly fed:  Newborns (babies 29 weeks of age or younger) may breastfeed every 1-3 hours.  Newborns should not go longer than 3 hours during the day or 5 hours during the night without breastfeeding.  You should breastfeed your baby a minimum of 8 times in a 24-hour period until you begin to introduce solid foods to your baby at around 47 months of age. BREAST MILK PUMPING Pumping and storing breast milk allows you to ensure that your baby is exclusively fed your breast milk, even at times when you are unable to breastfeed. This is especially important if you are going back to work while you are still breastfeeding or when you are not able to be present during feedings. Your lactation consultant can give you guidelines on how long it is safe to store breast milk.  A breast pump is a machine that allows you to pump milk from your breast into a sterile bottle.  The pumped breast milk can then be stored in a refrigerator or freezer. Some breast pumps are operated by hand, while others use electricity. Ask your lactation consultant which type will work best for you. Breast pumps can be purchased, but some hospitals and breastfeeding support groups lease breast pumps on a monthly basis. A lactation consultant can teach you how to hand express breast milk, if you prefer not to use a pump.  CARING FOR YOUR BREASTS WHILE YOU BREASTFEED Nipples can become dry, cracked, and sore while breastfeeding. The following recommendations can help keep your breasts moisturized and healthy:  Avoid using soap on your nipples.   Wear a supportive bra. Although not required, special nursing bras and tank tops are designed to allow access to your breasts for breastfeeding without taking off your entire bra or top. Avoid wearing underwire-style bras or extremely tight bras.  Air dry your nipples for 3-45minutes after each feeding.   Use only cotton bra pads to absorb leaked breast milk. Leaking of breast milk between feedings is normal.   Use lanolin on your nipples after breastfeeding. Lanolin helps to maintain your skin's normal moisture barrier. If you use pure lanolin, you do not need to wash it off before feeding your baby again. Pure lanolin is not toxic to your baby. You may also hand express a few drops of breast milk and gently massage that milk into your nipples and allow the milk to air dry. In the first few weeks after giving birth, some women experience extremely full breasts (engorgement). Engorgement can make your breasts feel heavy, warm, and tender to the touch. Engorgement peaks within 3-5 days after you give birth. The following recommendations can help ease engorgement:  Completely empty your breasts while breastfeeding or pumping. You may want to start by applying warm, moist heat (in the shower or with warm water-soaked hand towels) just before feeding or  pumping. This increases circulation and helps the milk flow. If your baby does not completely empty your breasts while breastfeeding, pump any extra milk after he or she is finished.  Wear a snug bra (nursing or regular) or tank top for 1-2 days  to signal your body to slightly decrease milk production.  Apply ice packs to your breasts, unless this is too uncomfortable for you.  Make sure that your baby is latched on and positioned properly while breastfeeding. If engorgement persists after 48 hours of following these recommendations, contact your health care provider or a Science writer. OVERALL HEALTH CARE RECOMMENDATIONS WHILE BREASTFEEDING  Eat healthy foods. Alternate between meals and snacks, eating 3 of each per day. Because what you eat affects your breast milk, some of the foods may make your baby more irritable than usual. Avoid eating these foods if you are sure that they are negatively affecting your baby.  Drink milk, fruit juice, and water to satisfy your thirst (about 10 glasses a day).   Rest often, relax, and continue to take your prenatal vitamins to prevent fatigue, stress, and anemia.  Continue breast self-awareness checks.  Avoid chewing and smoking tobacco.  Avoid alcohol and drug use. Some medicines that may be harmful to your baby can pass through breast milk. It is important to ask your health care provider before taking any medicine, including all over-the-counter and prescription medicine as well as vitamin and herbal supplements. It is possible to become pregnant while breastfeeding. If birth control is desired, ask your health care provider about options that will be safe for your baby. SEEK MEDICAL CARE IF:   You feel like you want to stop breastfeeding or have become frustrated with breastfeeding.  You have painful breasts or nipples.  Your nipples are cracked or bleeding.  Your breasts are red, tender, or warm.  You have a swollen area on either  breast.  You have a fever or chills.  You have nausea or vomiting.  You have drainage other than breast milk from your nipples.  Your breasts do not become full before feedings by the fifth day after you give birth.  You feel sad and depressed.  Your baby is too sleepy to eat well.  Your baby is having trouble sleeping.   Your baby is wetting less than 3 diapers in a 24-hour period.  Your baby has less than 3 stools in a 24-hour period.  Your baby's skin or the white part of his or her eyes becomes yellow.   Your baby is not gaining weight by 92 days of age. SEEK IMMEDIATE MEDICAL CARE IF:   Your baby is overly tired (lethargic) and does not want to wake up and feed.  Your baby develops an unexplained fever. Document Released: 09/26/2005 Document Revised: 10/01/2013 Document Reviewed: 03/20/2013 [facility] Patient Information 2015 [place], [place]. This information is not intended to replace advice given to you by your health care provider. Make sure you discuss any questions you have with your health care provider.

## 2014-06-20 NOTE — Progress Notes (Signed)
UR chart review completed.  

## 2014-06-20 NOTE — Discharge Summary (Signed)
Obstetric Discharge Summary  Reason for Admission: Pt is a G2P1011 at [redacted]w[redacted]d IOL for postdates  Patient has received care at [facility] since 28 wks (transferred from [place]), with Julianne Handler, CNM as primary provider.  Medications on Admission: Prescriptions prior to admission  Medication Sig Dispense Refill  . Evening Primrose Oil 500 MG CAPS Take 1 capsule by mouth 2 (two) times daily.      . Evening Primrose Oil 500 MG CAPS Place 1 capsule vaginally at bedtime.      . ondansetron (ZOFRAN ODT) 8 MG disintegrating tablet Take 1 tablet (8 mg total) by mouth every 8 (eight) hours as needed for nausea or vomiting.  20 tablet  0  . OVER THE COUNTER MEDICATION Take 1 capsule by mouth daily. Pt takes Blue Cohosh (500mg ) twice a day.      . Prenatal Vit-Fe Fumarate-FA (PRENATAL MULTIVITAMIN) TABS tablet Take 1 tablet by mouth every morning.        Intrapartum Course:  Admitted in latent early labor / augmentation of labor with pitocin / protracted active phase of labor / multiple position changes to facilitate progression to complete dilation / SVD of viable female infant with a 2nd degree repair by Julianne Handler, CNM / no immediate postpartum complications  Prenatal Labs: ABO, Rh: B/Positive/-- (06/08 0000)  Antibody: Negative (06/08 0000) Rubella: Immune (06/08 0000)   RPR: NON REAC (09/08 1350)  HBsAg: Negative (06/08 0000)  HIV: NONREACTIVE (09/08 1350)  GTT : normal GBS: Negative (07/27 0000)   Prenatal Procedures: NST and ultrasound Intrapartum Procedures: spontaneous vaginal delivery Postpartum Procedures: none Complications-Operative and Postpartum: 2nd degree perineal laceration  Labs: Hemoglobin  Date Value Ref Range Status  06/19/2014 9.8* 12.0 - 15.0 g/dL Final     REPEATED TO VERIFY     DELTA CHECK NOTED     HCT  Date Value Ref Range Status  06/19/2014 28.6* 36.0 - 46.0 % Final   Lab Results  Component Value Date   PLT 212 06/19/2014    Newborn  Data: Live born female  Birth Weight: 8 lb 15 oz (4054 g) APGAR: 8, 9  Home with mother.   Discharge Information: Date: 06/20/2014 Discharge Diagnoses:  Pt is a K4M0102 at [redacted]w[redacted]d S/P Term Pregnancy-delivered on 06/18/2014  Condition: stable Activity: pelvic rest Diet: routine Medications:    Medication List    STOP taking these medications       Evening Primrose Oil 500 MG Caps     OVER THE COUNTER MEDICATION      TAKE these medications       ibuprofen 600 MG tablet  Commonly known as:  ADVIL,MOTRIN  Take 1 tablet (600 mg total) by mouth every 6 (six) hours.     Iron 325 (65 FE) MG Tabs  Take 1 tablet by mouth 2 (two) times daily with a meal.     magnesium oxide 400 (241.3 MG) MG tablet  Commonly known as:  MAG-OX  Take 0.5 tablets (200 mg total) by mouth daily.     ondansetron 8 MG disintegrating tablet  Commonly known as:  ZOFRAN ODT  Take 1 tablet (8 mg total) by mouth every 8 (eight) hours as needed for nausea or vomiting.     prenatal multivitamin Tabs tablet  Take 1 tablet by mouth every morning.       Instructions: [facility] instruction booklet has been given and reviewed Discharge to: home     Follow-up Information   Follow up with  BHAMBRI, MELANIE, N, CNM. Schedule an appointment as soon as possible for a visit in 2 weeks. (postpartum depression evaluation)    Specialty:  Obstetrics and Gynecology   Contact information:   [address] [place] [address] [phone]       Follow up with Julianne Handler, N, CNM In 6 weeks. (postpartum visit)    Specialty:  Obstetrics and Gynecology   Contact information:   [place] [address] [phone]       Laury Deep, Jerilynn Mages, MSN, CNM 06/20/2014, 10:41 AM

## 2014-08-11 ENCOUNTER — Encounter (HOSPITAL_COMMUNITY): Payer: Self-pay | Admitting: *Deleted

## 2014-12-13 ENCOUNTER — Emergency Department (HOSPITAL_COMMUNITY): Payer: 59

## 2014-12-13 ENCOUNTER — Emergency Department (HOSPITAL_COMMUNITY)
Admission: EM | Admit: 2014-12-13 | Discharge: 2014-12-14 | Disposition: A | Discharge status: Home / Self Care | Payer: 59 | Attending: Emergency Medicine | Admitting: Emergency Medicine

## 2014-12-13 ENCOUNTER — Encounter (HOSPITAL_COMMUNITY): Payer: Self-pay | Admitting: Emergency Medicine

## 2014-12-13 DIAGNOSIS — R1031 Right lower quadrant pain: Secondary | ICD-10-CM | POA: Diagnosis not present

## 2014-12-13 DIAGNOSIS — Z3202 Encounter for pregnancy test, result negative: Secondary | ICD-10-CM | POA: Diagnosis not present

## 2014-12-13 DIAGNOSIS — Z87891 Personal history of nicotine dependence: Secondary | ICD-10-CM | POA: Insufficient documentation

## 2014-12-13 DIAGNOSIS — R197 Diarrhea, unspecified: Secondary | ICD-10-CM | POA: Diagnosis not present

## 2014-12-13 DIAGNOSIS — Z79899 Other long term (current) drug therapy: Secondary | ICD-10-CM | POA: Insufficient documentation

## 2014-12-13 DIAGNOSIS — R509 Fever, unspecified: Secondary | ICD-10-CM | POA: Diagnosis not present

## 2014-12-13 DIAGNOSIS — R000 Tachycardia, unspecified: Secondary | ICD-10-CM | POA: Diagnosis not present

## 2014-12-13 DIAGNOSIS — R112 Nausea with vomiting, unspecified: Secondary | ICD-10-CM | POA: Diagnosis present

## 2014-12-13 LAB — CBC WITH DIFFERENTIAL/PLATELET
Basophils Absolute: 0 10*3/uL (ref 0.0–0.1)
Basophils Relative: 0 % (ref 0–1)
Eosinophils Absolute: 0.1 10*3/uL (ref 0.0–0.7)
Eosinophils Relative: 1 % (ref 0–5)
HCT: 42.1 % (ref 36.0–46.0)
Hemoglobin: 14.2 g/dL (ref 12.0–15.0)
Lymphocytes Relative: 6 % — ABNORMAL LOW (ref 12–46)
Lymphs Abs: 0.5 10*3/uL — ABNORMAL LOW (ref 0.7–4.0)
MCH: 30.5 pg (ref 26.0–34.0)
MCHC: 33.7 g/dL (ref 30.0–36.0)
MCV: 90.5 fL (ref 78.0–100.0)
Monocytes Absolute: 0.3 10*3/uL (ref 0.1–1.0)
Monocytes Relative: 3 % (ref 3–12)
Neutro Abs: 7.4 10*3/uL (ref 1.7–7.7)
Neutrophils Relative %: 90 % — ABNORMAL HIGH (ref 43–77)
Platelets: 317 10*3/uL (ref 150–400)
RBC: 4.65 MIL/uL (ref 3.87–5.11)
RDW: 12.3 % (ref 11.5–15.5)
WBC: 8.2 10*3/uL (ref 4.0–10.5)

## 2014-12-13 LAB — COMPREHENSIVE METABOLIC PANEL
ALT: 16 U/L (ref 0–35)
AST: 26 U/L (ref 0–37)
Albumin: 4.8 g/dL (ref 3.5–5.2)
Alkaline Phosphatase: 58 U/L (ref 39–117)
Anion gap: 10 (ref 5–15)
BUN: 17 mg/dL (ref 6–23)
CO2: 26 mmol/L (ref 19–32)
Calcium: 9.1 mg/dL (ref 8.4–10.5)
Chloride: 106 mmol/L (ref 96–112)
Creatinine, Ser: 0.66 mg/dL (ref 0.50–1.10)
GFR calc Af Amer: >90 mL/min (ref >90)
GFR calc non Af Amer: >90 mL/min (ref >90)
Glucose, Bld: 126 mg/dL — ABNORMAL HIGH (ref 70–99)
Potassium: 3.4 mmol/L — ABNORMAL LOW (ref 3.5–5.1)
Sodium: 142 mmol/L (ref 135–145)
Total Bilirubin: 0.7 mg/dL (ref 0.3–1.2)
Total Protein: 8 g/dL (ref 6.0–8.3)

## 2014-12-13 LAB — WET PREP, GENITAL
Trich, Wet Prep: NONE SEEN
Yeast Wet Prep HPF POC: NONE SEEN

## 2014-12-13 LAB — URINALYSIS, ROUTINE W REFLEX MICROSCOPIC
Bilirubin Urine: NEGATIVE
Glucose, UA: NEGATIVE mg/dL
Hgb urine dipstick: NEGATIVE
Ketones, ur: NEGATIVE mg/dL
Leukocytes, UA: NEGATIVE
Nitrite: NEGATIVE
Protein, ur: NEGATIVE mg/dL
Specific Gravity, Urine: 1.026 (ref 1.005–1.030)
Urobilinogen, UA: 0.2 mg/dL (ref 0.0–1.0)
pH: 5 (ref 5.0–8.0)

## 2014-12-13 LAB — POC URINE PREG, ED: Preg Test, Ur: NEGATIVE

## 2014-12-13 LAB — LIPASE, BLOOD: Lipase: 24 U/L (ref 11–59)

## 2014-12-13 LAB — I-STAT CG4 LACTIC ACID, ED: Lactic Acid, Venous: 0.46 mmol/L — ABNORMAL LOW (ref 0.5–2.0)

## 2014-12-13 MED ORDER — IOHEXOL 300 MG/ML  SOLN
100.0000 mL | Freq: Once | INTRAMUSCULAR | Status: AC | PRN
  Administered 2014-12-13: 100 mL via INTRAVENOUS

## 2014-12-13 MED ORDER — SODIUM CHLORIDE 0.9 % IV BOLUS (SEPSIS)
1000.0000 mL | Freq: Once | INTRAVENOUS | Status: AC
  Administered 2014-12-13: 1000 mL via INTRAVENOUS

## 2014-12-13 MED ORDER — IOHEXOL 300 MG/ML  SOLN
50.0000 mL | Freq: Once | INTRAMUSCULAR | Status: AC | PRN
  Administered 2014-12-13: 50 mL via ORAL

## 2014-12-13 MED ORDER — ONDANSETRON HCL 4 MG/2ML IJ SOLN
4.0000 mg | Freq: Once | INTRAMUSCULAR | Status: AC
  Administered 2014-12-13: 4 mg via INTRAVENOUS
  Filled 2014-12-13: qty 2

## 2014-12-13 MED ORDER — HYDROMORPHONE HCL 1 MG/ML IJ SOLN
0.5000 mg | Freq: Once | INTRAMUSCULAR | Status: AC
  Administered 2014-12-13: 0.5 mg via INTRAVENOUS
  Filled 2014-12-13: qty 1

## 2014-12-13 MED ORDER — HYDROMORPHONE HCL 1 MG/ML IJ SOLN
1.0000 mg | Freq: Once | INTRAMUSCULAR | Status: AC
  Administered 2014-12-13: 1 mg via INTRAVENOUS
  Filled 2014-12-13: qty 1

## 2014-12-13 MED ORDER — SODIUM CHLORIDE 0.9 % IV BOLUS (SEPSIS)
500.0000 mL | Freq: Once | INTRAVENOUS | Status: AC
  Administered 2014-12-13: 500 mL via INTRAVENOUS

## 2014-12-13 NOTE — ED Notes (Addendum)
Pt states that she woke up with vomiting this morning then diarrhea started.  Pt states that she has kept clear liquids down for the past hour. Pt states that her abd pain is more lower.  Pt states that she had temperature of 101 and took 1000mg  Tylenol around 9am and Zofran 4mg  at 4am.

## 2014-12-13 NOTE — ED Provider Notes (Signed)
CSN: [id number]     Arrival date & time 12/13/14  1552 History   First MD Initiated Contact with Patient 12/13/14 1659     Chief Complaint  Patient presents with  . Emesis  . Diarrhea     (Consider location/radiation/quality/duration/timing/severity/associated sxs/prior Treatment) The history is provided by the patient. No language interpreter was used.  Sharon Hartman is a 34 y/o F with PMHx of abnormal pap smear with LEEP presenting to the ED with nausea, vomiting, diarrhea and abdominal pain that started suddenly this morning at approximately 2:00AM. Patient reported that she has had at least 12 episodes of emesis - NB. Reported that she has had at least 4-5 episodes of diarrhea - loose stools without blood or mucus. Patient reported that at first she was having generalized lower abdominal pain - reported that since earlier this morning her abdominal pain is localized to the RLQ described as a sharp pain intermittently. Reported that she had a fever this morning of 102F, took Tylenol at approximately 8:00-9:00 AM this Morning. Stated that she has been able to keep down only 10 ounces of water. Reported that yesterday she felt "blah" and stated that she ate [employer] last night for dinner. Patient is a Marine scientist at [employer]. Denied cough, nasal congestion, chest pain, shortness of breath, difficulty breathing, melena, hematochezia, headache, dizziness, blurred vision, sudden loss of vision, neck pain, neck stiffness, back pain, fainting. PCP Dr. Nancy Fetter   Past Medical History  Diagnosis Date  . Medical history non-contributory   . Vaginal Pap smear, abnormal    Past Surgical History  Procedure Laterality Date  . Tonsillectomy    . Dilation and curettage of uterus    . Leep     Family History  Problem Relation Age of Onset  . Heart murmur Mother   . Diabetes Mother   . Hypertension Father   . Hyperlipidemia Father   . Breast cancer Paternal Grandmother   . Hypertension  Paternal Grandmother   . Hyperlipidemia Paternal Grandmother   . Heart disease Paternal Grandmother   . Hypertension Paternal Grandfather   . Heart disease Paternal Grandfather     CABG  . Heart attack Paternal Grandfather     X4  . Hyperlipidemia Paternal Grandfather    History  Substance Use Topics  . Smoking status: Former Research scientist (life sciences)  . Smokeless tobacco: Never Used  . Alcohol Use: No     Comment: socially   OB History    Gravida Para Term Preterm AB TAB SAB Ectopic Multiple Living   2 1 1  1 1    1      Review of Systems  Constitutional: Positive for fever and chills.  Respiratory: Negative for chest tightness and shortness of breath.   Cardiovascular: Negative for chest pain.  Gastrointestinal: Positive for nausea, vomiting, abdominal pain and diarrhea. Negative for constipation, blood in stool and anal bleeding.  Genitourinary: Negative for dysuria, hematuria, decreased urine volume, vaginal bleeding, vaginal discharge, vaginal pain and pelvic pain.  Musculoskeletal: Negative for back pain and neck pain.  Neurological: Negative for dizziness, light-headedness and headaches.      Allergies  Prednisone  Home Medications   Prior to Admission medications   Medication Sig Start Date End Date Taking? Authorizing Provider  escitalopram (LEXAPRO) 10 MG tablet Take 10 mg by mouth daily.   Yes Historical Provider, MD  NIFEdipine (PROCARDIA-XL/ADALAT-CC/NIFEDICAL-XL) 30 MG 24 hr tablet Take 30 mg by mouth daily.   Yes Historical Provider,  MD  ondansetron (ZOFRAN ODT) 8 MG disintegrating tablet Take 1 tablet (8 mg total) by mouth every 8 (eight) hours as needed for nausea or vomiting. 05/25/14  Yes Laury Deep, CNM  Prenatal Vit-Fe Fumarate-FA (PRENATAL MULTIVITAMIN) TABS tablet Take 1 tablet by mouth every morning.   Yes Historical Provider, MD  Ferrous Sulfate (IRON) 325 (65 FE) MG TABS Take 1 tablet by mouth 2 (two) times daily with a meal. Patient not taking: Reported on  12/13/2014 06/20/14   Laury Deep, CNM  ibuprofen (ADVIL,MOTRIN) 600 MG tablet Take 1 tablet (600 mg total) by mouth every 6 (six) hours. Patient not taking: Reported on 12/13/2014 06/20/14   Laury Deep, CNM  magnesium oxide (MAG-OX) 400 (241.3 MG) MG tablet Take 0.5 tablets (200 mg total) by mouth daily. Patient not taking: Reported on 12/13/2014 06/20/14   Laury Deep, CNM   BP 103/51 mmHg  Pulse 106  Temp(Src) 99.2 F (37.3 C) (Oral)  Resp 15  Ht 5\' 6"  (1.676 m)  Wt 163 lb (73.936 kg)  BMI 26.32 kg/m2  SpO2 95% Physical Exam  Constitutional: She is oriented to person, place, and time. She appears well-developed and well-nourished. No distress.  HENT:  Head: Normocephalic and atraumatic.  Mouth/Throat: Oropharynx is clear and moist. No oropharyngeal exudate.  Eyes: Conjunctivae and EOM are normal. Pupils are equal, round, and reactive to light. Right eye exhibits no discharge. Left eye exhibits no discharge.  Neck: Normal range of motion. Neck supple. No tracheal deviation present.  Cardiovascular: Regular rhythm and normal heart sounds.  Tachycardia present.   Pulses:      Radial pulses are 2+ on the right side, and 2+ on the left side.  Pulmonary/Chest: Effort normal and breath sounds normal. No respiratory distress. She has no wheezes. She has no rales.  Abdominal: Soft. Bowel sounds are normal. She exhibits no distension. There is tenderness in the right lower quadrant. There is no rebound, no guarding and no CVA tenderness.  Positive Rovsing's sign  Genitourinary:  Pelvic Exam: Negative swelling, erythema, inflammation, lesions, sores, deformities noted to the external genitalia. Negative bright red blood in the vaginal vault. Negative discharge. Cervix identified with negative friability. Negative CMT or left adnexal tenderness. Mild right adnexal tenderness noted.  Exam chaperoned with tech, Bre  Musculoskeletal: Normal range of motion.  Lymphadenopathy:    She has no  cervical adenopathy.  Neurological: She is alert and oriented to person, place, and time. No cranial nerve deficit. She exhibits normal muscle tone. Coordination normal.  Skin: Skin is warm and dry. No rash noted. She is not diaphoretic. No erythema.  Psychiatric: She has a normal mood and affect. Her behavior is normal. Thought content normal.  Nursing note and vitals reviewed.   ED Course  Procedures (including critical care time)   Results for orders placed or performed during the hospital encounter of 12/13/14  Wet prep, genital  Result Value Ref Range   Yeast Wet Prep HPF POC NONE SEEN NONE SEEN   Trich, Wet Prep NONE SEEN NONE SEEN   Clue Cells Wet Prep HPF POC RARE (A) NONE SEEN   WBC, Wet Prep HPF POC RARE (A) NONE SEEN  CBC with Differential  Result Value Ref Range   WBC 8.2 4.0 - 10.5 K/uL   RBC 4.65 3.87 - 5.11 MIL/uL   Hemoglobin 14.2 12.0 - 15.0 g/dL   HCT 42.1 36.0 - 46.0 %   MCV 90.5 78.0 - 100.0 fL   MCH 30.5 26.0 -  34.0 pg   MCHC 33.7 30.0 - 36.0 g/dL   RDW 12.3 11.5 - 15.5 %   Platelets 317 150 - 400 K/uL   Neutrophils Relative % 90 (H) 43 - 77 %   Neutro Abs 7.4 1.7 - 7.7 K/uL   Lymphocytes Relative 6 (L) 12 - 46 %   Lymphs Abs 0.5 (L) 0.7 - 4.0 K/uL   Monocytes Relative 3 3 - 12 %   Monocytes Absolute 0.3 0.1 - 1.0 K/uL   Eosinophils Relative 1 0 - 5 %   Eosinophils Absolute 0.1 0.0 - 0.7 K/uL   Basophils Relative 0 0 - 1 %   Basophils Absolute 0.0 0.0 - 0.1 K/uL  Comprehensive metabolic panel  Result Value Ref Range   Sodium 142 135 - 145 mmol/L   Potassium 3.4 (L) 3.5 - 5.1 mmol/L   Chloride 106 96 - 112 mmol/L   CO2 26 19 - 32 mmol/L   Glucose, Bld 126 (H) 70 - 99 mg/dL   BUN 17 6 - 23 mg/dL   Creatinine, Ser 0.66 0.50 - 1.10 mg/dL   Calcium 9.1 8.4 - 10.5 mg/dL   Total Protein 8.0 6.0 - 8.3 g/dL   Albumin 4.8 3.5 - 5.2 g/dL   AST 26 0 - 37 U/L   ALT 16 0 - 35 U/L   Alkaline Phosphatase 58 39 - 117 U/L   Total Bilirubin 0.7 0.3 - 1.2 mg/dL    GFR calc non Af Amer >90 >90 mL/min   GFR calc Af Amer >90 >90 mL/min   Anion gap 10 5 - 15  Lipase, blood  Result Value Ref Range   Lipase 24 11 - 59 U/L  Urinalysis, Routine w reflex microscopic  Result Value Ref Range   Color, Urine YELLOW YELLOW   APPearance CLEAR CLEAR   Specific Gravity, Urine 1.026 1.005 - 1.030   pH 5.0 5.0 - 8.0   Glucose, UA NEGATIVE NEGATIVE mg/dL   Hgb urine dipstick NEGATIVE NEGATIVE   Bilirubin Urine NEGATIVE NEGATIVE   Ketones, ur NEGATIVE NEGATIVE mg/dL   Protein, ur NEGATIVE NEGATIVE mg/dL   Urobilinogen, UA 0.2 0.0 - 1.0 mg/dL   Nitrite NEGATIVE NEGATIVE   Leukocytes, UA NEGATIVE NEGATIVE  POC Urine Pregnancy, ED  (If Pre-menopausal female) - do not order at [facility]  Result Value Ref Range   Preg Test, Ur NEGATIVE NEGATIVE  I-Stat CG4 Lactic Acid, ED  Result Value Ref Range   Lactic Acid, Venous 0.46 (L) 0.5 - 2.0 mmol/L    Labs Review Labs Reviewed  WET PREP, GENITAL - Abnormal; Notable for the following:    Clue Cells Wet Prep HPF POC RARE (*)    WBC, Wet Prep HPF POC RARE (*)    All other components within normal limits  CBC WITH DIFFERENTIAL/PLATELET - Abnormal; Notable for the following:    Neutrophils Relative % 90 (*)    Lymphocytes Relative 6 (*)    Lymphs Abs 0.5 (*)    All other components within normal limits  COMPREHENSIVE METABOLIC PANEL - Abnormal; Notable for the following:    Potassium 3.4 (*)    Glucose, Bld 126 (*)    All other components within normal limits  I-STAT CG4 LACTIC ACID, ED - Abnormal; Notable for the following:    Lactic Acid, Venous 0.46 (*)    All other components within normal limits  LIPASE, BLOOD  URINALYSIS, ROUTINE W REFLEX MICROSCOPIC  HIV ANTIBODY (ROUTINE TESTING)  POC URINE PREG,  ED  GC/CHLAMYDIA PROBE AMP ([place])    Imaging Review US Transvaginal Non-ob  12/13/2014   CLINICAL DATA:  Right lower quadrant abdominal pain  EXAM: TRANSABDOMINAL AND TRANSVAGINAL ULTRASOUND OF PELVIS   DOPPLER ULTRASOUND OF OVARIES  TECHNIQUE: Both transabdominal and transvaginal ultrasound examinations of the pelvis were performed. Transabdominal technique was performed for global imaging of the pelvis including uterus, ovaries, adnexal regions, and pelvic cul-de-sac.  It was necessary to proceed with endovaginal exam following the transabdominal exam to visualize the ovaries. Color and duplex Doppler ultrasound was utilized to evaluate blood flow to the ovaries.  COMPARISON:  10/05/2013  FINDINGS: Uterus  Measurements: 7 cm in length. No fibroids or other mass visualized.  Endometrium  Thickness: 4 mm.  There is an IUD which is in normal position.  Right ovary  Measurements: 3.9 x 2.1 x 2.7 cm. Normal appearance/no adnexal mass.  Left ovary  Measurements: 3 x 1.8 x 2.7 cm. Normal appearance/no adnexal mass.  Pulsed Doppler evaluation of both ovaries demonstrates normal low-resistance arterial and venous waveforms.  Other findings  No free fluid.  IMPRESSION: 1. No ovarian torsion or other pathologic finding. 2. IUD in good position.   Electronically Signed   By: Monte Fantasia M.D.   On: 12/13/2014 23:59   US Pelvis Complete  12/13/2014   CLINICAL DATA:  Right lower quadrant abdominal pain  EXAM: TRANSABDOMINAL AND TRANSVAGINAL ULTRASOUND OF PELVIS  DOPPLER ULTRASOUND OF OVARIES  TECHNIQUE: Both transabdominal and transvaginal ultrasound examinations of the pelvis were performed. Transabdominal technique was performed for global imaging of the pelvis including uterus, ovaries, adnexal regions, and pelvic cul-de-sac.  It was necessary to proceed with endovaginal exam following the transabdominal exam to visualize the ovaries. Color and duplex Doppler ultrasound was utilized to evaluate blood flow to the ovaries.  COMPARISON:  10/05/2013  FINDINGS: Uterus  Measurements: 7 cm in length. No fibroids or other mass visualized.  Endometrium  Thickness: 4 mm.  There is an IUD which is in normal position.  Right ovary   Measurements: 3.9 x 2.1 x 2.7 cm. Normal appearance/no adnexal mass.  Left ovary  Measurements: 3 x 1.8 x 2.7 cm. Normal appearance/no adnexal mass.  Pulsed Doppler evaluation of both ovaries demonstrates normal low-resistance arterial and venous waveforms.  Other findings  No free fluid.  IMPRESSION: 1. No ovarian torsion or other pathologic finding. 2. IUD in good position.   Electronically Signed   By: Monte Fantasia M.D.   On: 12/13/2014 23:59   Ct Abdomen Pelvis W Contrast  12/13/2014   CLINICAL DATA:  Emesis and diarrhea. Nausea and vomiting. Question appendicitis  EXAM: CT ABDOMEN AND PELVIS WITH CONTRAST  TECHNIQUE: Multidetector CT imaging of the abdomen and pelvis was performed using the standard protocol following bolus administration of intravenous contrast.  CONTRAST:  46mL OMNIPAQUE IOHEXOL 300 MG/ML SOLN, 136mL OMNIPAQUE IOHEXOL 300 MG/ML SOLN  COMPARISON:  None.  FINDINGS: BODY WALL: Unremarkable.  LOWER CHEST: Unremarkable.  ABDOMEN/PELVIS:  Liver: No focal abnormality.  Biliary: No evidence of biliary obstruction or stone.  Pancreas: Unremarkable.  Spleen: Unremarkable.  Adrenals: Unremarkable.  Kidneys and ureters: No hydronephrosis or stone.  Bladder: Unremarkable.  Reproductive: IUD which is in good position  Bowel: No obstruction. Negative appendix.  Retroperitoneum: Prominence of mesenteric lymph nodes, usually incidental. There is no evidence of bowel inflammation.  Peritoneum: No ascites or pneumoperitoneum.  Vascular: No acute abnormality.  OSSEOUS: No acute abnormalities.  IMPRESSION: No appendicitis or other acute intra-abdominal  disease.   Electronically Signed   By: Monte Fantasia M.D.   On: 12/13/2014 21:06   [place] Art/ven Flow Abd Pelv Doppler  12/13/2014   CLINICAL DATA:  Right lower quadrant abdominal pain  EXAM: TRANSABDOMINAL AND TRANSVAGINAL ULTRASOUND OF PELVIS  DOPPLER ULTRASOUND OF OVARIES  TECHNIQUE: Both transabdominal and transvaginal ultrasound examinations of the  pelvis were performed. Transabdominal technique was performed for global imaging of the pelvis including uterus, ovaries, adnexal regions, and pelvic cul-de-sac.  It was necessary to proceed with endovaginal exam following the transabdominal exam to visualize the ovaries. Color and duplex Doppler ultrasound was utilized to evaluate blood flow to the ovaries.  COMPARISON:  10/05/2013  FINDINGS: Uterus  Measurements: 7 cm in length. No fibroids or other mass visualized.  Endometrium  Thickness: 4 mm.  There is an IUD which is in normal position.  Right ovary  Measurements: 3.9 x 2.1 x 2.7 cm. Normal appearance/no adnexal mass.  Left ovary  Measurements: 3 x 1.8 x 2.7 cm. Normal appearance/no adnexal mass.  Pulsed Doppler evaluation of both ovaries demonstrates normal low-resistance arterial and venous waveforms.  Other findings  No free fluid.  IMPRESSION: 1. No ovarian torsion or other pathologic finding. 2. IUD in good position.   Electronically Signed   By: Monte Fantasia M.D.   On: 12/13/2014 23:59     EKG Interpretation None      9:47 PM Patient continues to have pain in the right lower quadrant. Reported that her nausea has improved.   MDM   Final diagnoses:  RLQ abdominal pain  Non-intractable vomiting with nausea, vomiting of unspecified type  Gastroenteritis    Medications  sodium chloride 0.9 % bolus 1,000 mL (0 mLs Intravenous Stopped 12/13/14 1802)  ondansetron (ZOFRAN) injection 4 mg (4 mg Intravenous Given 12/13/14 1731)  HYDROmorphone (DILAUDID) injection 1 mg (1 mg Intravenous Given 12/13/14 1731)  sodium chloride 0.9 % bolus 1,000 mL (0 mLs Intravenous Stopped 12/13/14 1905)  iohexol (OMNIPAQUE) 300 MG/ML solution 50 mL (50 mLs Oral Contrast Given 12/13/14 1930)  iohexol (OMNIPAQUE) 300 MG/ML solution 100 mL (100 mLs Intravenous Contrast Given 12/13/14 1931)  HYDROmorphone (DILAUDID) injection 0.5 mg (0.5 mg Intravenous Given 12/13/14 1945)  sodium chloride 0.9 % bolus 500 mL (0 mLs  Intravenous Stopped 12/13/14 2330)  acetaminophen (TYLENOL) tablet 650 mg (650 mg Oral Given 12/14/14 0016)    Filed Vitals:   12/13/14 2057 12/13/14 2122 12/13/14 2248 12/14/14 0120  BP: 107/50  107/55 103/51  Pulse: 105  104 106  Temp:  99.2 F (37.3 C)    TempSrc:  Oral    Resp: 18  21 15   Height:      Weight:      SpO2: 97%  94% 95%   CBC negative elevated leukocytosis. Hemoglobin 14.2, hematocrit 42.1. CMP unremarkable-mildly low potassium of 3.4. Lipase negative elevation. Lactic acid 0.46. Urinalysis negative for hemoglobin, nitrites, leukocytes-negative findings of infection. Urine pregnancy negative. Wet prep unremarkable. CT abdomen and pelvis with contrast unremarkable-no findings of appendicitis or other acute intra-abdominal processes. IUD noted in good position. Pelvic ultrasound no findings of ovarian torsion or other pathological findings. Negative findings of UTI or pyelonephritis. Negative findings of ovarian torsion. Negative findings of TOA. Negative findings of appendicitis. Negative findings of pancreatitis. Definitive etiology of patient's discomfort is unknown. Possible beginnings of gastroenteritis-abdominal pain with nausea, vomiting, diarrhea - most likely viral in nature. Patient hydrated in the ED setting with IV fluids. Pain medications administered with relief  of pain. Heart rate is decreased from 124 bpm to 104 bpm, but patient continues to remain tachycardic at 104 bpm.   1:33 AM Discussed with Dr. Suzie Portela who recommended one more liters of fluids to be administered. Discussed case with attending physician - transfer of care at change in shift.   Jamse Mead, PA-C 12/14/14 0146  Jamse Mead, PA-C 12/14/14 8413  Quintella Reichert, MD 12/14/14 [phone]

## 2014-12-14 MED ORDER — SODIUM CHLORIDE 0.9 % IV BOLUS (SEPSIS)
1000.0000 mL | Freq: Once | INTRAVENOUS | Status: AC
  Administered 2014-12-14: 1000 mL via INTRAVENOUS

## 2014-12-14 MED ORDER — ACETAMINOPHEN 325 MG PO TABS
650.0000 mg | ORAL_TABLET | Freq: Once | ORAL | Status: AC
  Administered 2014-12-14: 650 mg via ORAL
  Filled 2014-12-14: qty 2

## 2014-12-14 NOTE — ED Provider Notes (Signed)
Patient was retained in the emergency department for persistent tachycardia. She was ordered a third liter of IV fluids. Heart rate decreased down to 97. At this time her vital signs are within normal limits and she is safe for discharge.  Everlene Balls, MD 12/14/14 [phone]

## 2014-12-14 NOTE — Discharge Instructions (Signed)
°  Nausea and Vomiting Ms. Cowans, your ultrasound ans CT scan did not show a cause for your vomiting.  You were given IVF for dehydration.  Follow-up with a primary care physician within 3 days for continued management. If symptoms worsen come back to the emergency department immediately. Thank you. Nausea means you feel sick to your stomach. Throwing up (vomiting) is a reflex where stomach contents come out of your mouth. HOME CARE   Take medicine as told by your doctor.  Do not force yourself to eat. However, you do need to drink fluids.  If you feel like eating, eat a normal diet as told by your doctor.  Eat rice, wheat, potatoes, bread, lean meats, yogurt, fruits, and vegetables.  Avoid high-fat foods.  Drink enough fluids to keep your pee (urine) clear or pale yellow.  Ask your doctor how to replace body fluid losses (rehydrate). Signs of body fluid loss (dehydration) include:  Feeling very thirsty.  Dry lips and mouth.  Feeling dizzy.  Dark pee.  Peeing less than normal.  Feeling confused.  Fast breathing or heart rate. GET HELP RIGHT AWAY IF:   You have blood in your throw up.  You have black or bloody poop (stool).  You have a bad headache or stiff neck.  You feel confused.  You have bad belly (abdominal) pain.  You have chest pain or trouble breathing.  You do not pee at least once every 8 hours.  You have cold, clammy skin.  You keep throwing up after 24 to 48 hours.  You have a fever. MAKE SURE YOU:   Understand these instructions.  Will watch your condition.  Will get help right away if you are not doing well or get worse. Document Released: 03/14/2008 Document Revised: 12/19/2011 Document Reviewed: 02/25/2011 [facility] Patient Information 2015 [place], [place]. This information is not intended to replace advice given to you by your health care provider. Make sure you discuss any questions you have with your health care provider.

## 2014-12-15 LAB — HIV ANTIBODY (ROUTINE TESTING W REFLEX): HIV Screen 4th Generation wRfx: NONREACTIVE

## 2014-12-16 LAB — GC/CHLAMYDIA PROBE AMP (~~LOC~~) NOT AT ARMC
Chlamydia: NEGATIVE
Neisseria Gonorrhea: NEGATIVE

## 2016-03-09 DIAGNOSIS — G441 Vascular headache, not elsewhere classified: Secondary | ICD-10-CM | POA: Diagnosis not present

## 2016-03-23 MED FILL — DEXTROAMP-AMPHETAMIN 20 MG: 20 | 30 days supply | Qty: 60 | Fill #0

## 2016-03-23 MED FILL — SUMATRIPTAN SUCC 100 MG TAB: 100 | 30 days supply | Qty: 9 | Fill #0

## 2016-03-24 DIAGNOSIS — M9902 Segmental and somatic dysfunction of thoracic region: Secondary | ICD-10-CM | POA: Diagnosis not present

## 2016-03-24 DIAGNOSIS — M5412 Radiculopathy, cervical region: Secondary | ICD-10-CM | POA: Diagnosis not present

## 2016-03-24 DIAGNOSIS — Q72812 Congenital shortening of left lower limb: Secondary | ICD-10-CM | POA: Diagnosis not present

## 2016-03-24 DIAGNOSIS — M9903 Segmental and somatic dysfunction of lumbar region: Secondary | ICD-10-CM | POA: Diagnosis not present

## 2016-03-24 DIAGNOSIS — M9901 Segmental and somatic dysfunction of cervical region: Secondary | ICD-10-CM | POA: Diagnosis not present

## 2016-03-24 DIAGNOSIS — M9905 Segmental and somatic dysfunction of pelvic region: Secondary | ICD-10-CM | POA: Diagnosis not present

## 2016-03-24 DIAGNOSIS — M5137 Other intervertebral disc degeneration, lumbosacral region: Secondary | ICD-10-CM | POA: Diagnosis not present

## 2016-03-24 DIAGNOSIS — M5383 Other specified dorsopathies, cervicothoracic region: Secondary | ICD-10-CM | POA: Diagnosis not present

## 2016-03-28 DIAGNOSIS — M5137 Other intervertebral disc degeneration, lumbosacral region: Secondary | ICD-10-CM | POA: Diagnosis not present

## 2016-03-28 DIAGNOSIS — Q72812 Congenital shortening of left lower limb: Secondary | ICD-10-CM | POA: Diagnosis not present

## 2016-03-28 DIAGNOSIS — M9905 Segmental and somatic dysfunction of pelvic region: Secondary | ICD-10-CM | POA: Diagnosis not present

## 2016-03-28 DIAGNOSIS — M9901 Segmental and somatic dysfunction of cervical region: Secondary | ICD-10-CM | POA: Diagnosis not present

## 2016-03-28 DIAGNOSIS — M5383 Other specified dorsopathies, cervicothoracic region: Secondary | ICD-10-CM | POA: Diagnosis not present

## 2016-03-28 DIAGNOSIS — M9902 Segmental and somatic dysfunction of thoracic region: Secondary | ICD-10-CM | POA: Diagnosis not present

## 2016-03-28 DIAGNOSIS — M5412 Radiculopathy, cervical region: Secondary | ICD-10-CM | POA: Diagnosis not present

## 2016-03-28 DIAGNOSIS — M9903 Segmental and somatic dysfunction of lumbar region: Secondary | ICD-10-CM | POA: Diagnosis not present

## 2016-03-29 ENCOUNTER — Ambulatory Visit: Payer: Self-pay

## 2016-03-29 ENCOUNTER — Other Ambulatory Visit: Payer: Self-pay | Admitting: Occupational Medicine

## 2016-03-29 DIAGNOSIS — M546 Pain in thoracic spine: Secondary | ICD-10-CM

## 2016-04-11 DIAGNOSIS — M542 Cervicalgia: Secondary | ICD-10-CM | POA: Diagnosis not present

## 2016-04-11 DIAGNOSIS — M955 Acquired deformity of pelvis: Secondary | ICD-10-CM | POA: Diagnosis not present

## 2016-04-13 DIAGNOSIS — G43909 Migraine, unspecified, not intractable, without status migrainosus: Secondary | ICD-10-CM | POA: Diagnosis not present

## 2016-04-13 DIAGNOSIS — R635 Abnormal weight gain: Secondary | ICD-10-CM | POA: Diagnosis not present

## 2016-04-13 DIAGNOSIS — F900 Attention-deficit hyperactivity disorder, predominantly inattentive type: Secondary | ICD-10-CM | POA: Diagnosis not present

## 2016-04-13 DIAGNOSIS — F419 Anxiety disorder, unspecified: Secondary | ICD-10-CM | POA: Diagnosis not present

## 2016-04-13 DIAGNOSIS — F322 Major depressive disorder, single episode, severe without psychotic features: Secondary | ICD-10-CM | POA: Diagnosis not present

## 2016-04-13 MED FILL — ESCITALOPRAM 10 MG TABLET: 10 | 90 days supply | Qty: 90 | Fill #0

## 2016-04-21 MED FILL — SUMATRIPTAN SUCC 100 MG TAB: 100 | 30 days supply | Qty: 9 | Fill #0

## 2016-04-21 MED FILL — DEXTROAMP-AMPHETAMIN 20 MG: 20 | 30 days supply | Qty: 60 | Fill #0

## 2016-05-24 MED FILL — DEXTROAMP-AMPHETAMIN 20 MG: 20 | 30 days supply | Qty: 60 | Fill #0

## 2016-05-31 DIAGNOSIS — L814 Other melanin hyperpigmentation: Secondary | ICD-10-CM | POA: Diagnosis not present

## 2016-05-31 DIAGNOSIS — D2271 Melanocytic nevi of right lower limb, including hip: Secondary | ICD-10-CM | POA: Diagnosis not present

## 2016-05-31 DIAGNOSIS — D225 Melanocytic nevi of trunk: Secondary | ICD-10-CM | POA: Diagnosis not present

## 2016-05-31 DIAGNOSIS — L918 Other hypertrophic disorders of the skin: Secondary | ICD-10-CM | POA: Diagnosis not present

## 2016-05-31 DIAGNOSIS — D485 Neoplasm of uncertain behavior of skin: Secondary | ICD-10-CM | POA: Diagnosis not present

## 2016-05-31 DIAGNOSIS — D2272 Melanocytic nevi of left lower limb, including hip: Secondary | ICD-10-CM | POA: Diagnosis not present

## 2016-05-31 DIAGNOSIS — D2262 Melanocytic nevi of left upper limb, including shoulder: Secondary | ICD-10-CM | POA: Diagnosis not present

## 2016-05-31 DIAGNOSIS — D224 Melanocytic nevi of scalp and neck: Secondary | ICD-10-CM | POA: Diagnosis not present

## 2016-05-31 DIAGNOSIS — D2372 Other benign neoplasm of skin of left lower limb, including hip: Secondary | ICD-10-CM | POA: Diagnosis not present

## 2016-05-31 DIAGNOSIS — C44519 Basal cell carcinoma of skin of other part of trunk: Secondary | ICD-10-CM | POA: Diagnosis not present

## 2016-06-09 DIAGNOSIS — L988 Other specified disorders of the skin and subcutaneous tissue: Secondary | ICD-10-CM | POA: Diagnosis not present

## 2016-06-09 DIAGNOSIS — H52223 Regular astigmatism, bilateral: Secondary | ICD-10-CM | POA: Diagnosis not present

## 2016-06-09 DIAGNOSIS — C44519 Basal cell carcinoma of skin of other part of trunk: Secondary | ICD-10-CM | POA: Diagnosis not present

## 2016-06-09 DIAGNOSIS — D225 Melanocytic nevi of trunk: Secondary | ICD-10-CM | POA: Diagnosis not present

## 2016-06-28 MED FILL — DEXTROAMP-AMPHETAMIN 20 MG: 20 | 30 days supply | Qty: 60 | Fill #0

## 2016-07-13 DIAGNOSIS — F900 Attention-deficit hyperactivity disorder, predominantly inattentive type: Secondary | ICD-10-CM | POA: Diagnosis not present

## 2016-07-13 DIAGNOSIS — F419 Anxiety disorder, unspecified: Secondary | ICD-10-CM | POA: Diagnosis not present

## 2016-07-13 MED FILL — ESCITALOPRAM 20 MG TABLET: 20 | 30 days supply | Qty: 30 | Fill #0

## 2016-07-18 MED FILL — DEXTROAMP-AMPHETAMIN 20 MG: 20 | 30 days supply | Qty: 60 | Fill #0

## 2016-08-19 MED FILL — ESCITALOPRAM 20 MG TABLET: 20 | 30 days supply | Qty: 30 | Fill #0

## 2016-08-19 MED FILL — DEXTROAMP-AMPHETAMIN 20 MG: 20 | 30 days supply | Qty: 60 | Fill #0

## 2016-09-23 MED FILL — ESCITALOPRAM 20 MG TABLET: 20 | 30 days supply | Qty: 30 | Fill #1

## 2016-09-23 MED FILL — DEXTROAMP-AMPHETAMIN 20 MG: 20 | 30 days supply | Qty: 60 | Fill #0

## 2016-10-24 MED FILL — ESCITALOPRAM 20 MG TABLET: 20 | 30 days supply | Qty: 30 | Fill #2

## 2016-10-25 MED FILL — AMPHETAMINE SALTS 20 MG TAB: 20 | 30 days supply | Qty: 60 | Fill #0

## 2016-11-25 MED FILL — AMPHETAMINE SALTS 20 MG TAB: 20 | 30 days supply | Qty: 60 | Fill #0

## 2016-11-25 MED FILL — ESCITALOPRAM 20 MG TABLET: 20 | 30 days supply | Qty: 30 | Fill #3

## 2016-12-22 DIAGNOSIS — G43909 Migraine, unspecified, not intractable, without status migrainosus: Secondary | ICD-10-CM | POA: Diagnosis not present

## 2016-12-22 DIAGNOSIS — F322 Major depressive disorder, single episode, severe without psychotic features: Secondary | ICD-10-CM | POA: Diagnosis not present

## 2016-12-22 DIAGNOSIS — F419 Anxiety disorder, unspecified: Secondary | ICD-10-CM | POA: Diagnosis not present

## 2016-12-22 DIAGNOSIS — Z13220 Encounter for screening for lipoid disorders: Secondary | ICD-10-CM | POA: Diagnosis not present

## 2016-12-22 DIAGNOSIS — F900 Attention-deficit hyperactivity disorder, predominantly inattentive type: Secondary | ICD-10-CM | POA: Diagnosis not present

## 2016-12-22 MED FILL — DULoxetine HCL 60 MG CPEP: 60 | 30 days supply | Qty: 30 | Fill #0

## 2016-12-22 MED FILL — LORazepam 0.5 MG TABS: 0.5 | 30 days supply | Qty: 30 | Fill #0

## 2016-12-27 MED FILL — AMPHETAMINE SALTS 20 MG TAB: 20 | 30 days supply | Qty: 60 | Fill #0

## 2017-01-23 MED FILL — DULoxetine HCL 60 MG CPEP: 60 | 30 days supply | Qty: 30 | Fill #1

## 2017-01-24 MED FILL — AMPHETAMINE SALTS 20 MG TAB: 20 | 30 days supply | Qty: 60 | Fill #0

## 2017-01-28 ENCOUNTER — Encounter (HOSPITAL_COMMUNITY): Payer: Self-pay | Admitting: Emergency Medicine

## 2017-01-28 ENCOUNTER — Ambulatory Visit (HOSPITAL_COMMUNITY)
Admission: EM | Admit: 2017-01-28 | Discharge: 2017-01-28 | Disposition: A | Discharge status: Home / Self Care | Payer: 59 | Attending: Family Medicine | Admitting: Family Medicine

## 2017-01-28 DIAGNOSIS — J020 Streptococcal pharyngitis: Secondary | ICD-10-CM | POA: Diagnosis not present

## 2017-01-28 MED ORDER — AMOXICILLIN 875 MG PO TABS: 875.0000 mg | ORAL_TABLET | Freq: Two times a day (BID) | ORAL | 0 refills | Status: DC

## 2017-01-28 NOTE — ED Provider Notes (Signed)
[place]    CSN: [id number] Arrival date & time: 01/28/17  1642     History   Chief Complaint Chief Complaint  Patient presents with  . Sore Throat    HPI Sharon Hartman is a 36 y.o. female.   36 yo woman being treated for ADD who presents with sore throat.  37 and 85/45 year old son tested positive for strep.  Pain in throat and right glands radiates into ears.  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      Past Medical History:  Diagnosis Date  . Medical history non-contributory   . Vaginal Pap smear, abnormal     Patient Active Problem List   Diagnosis Date Noted  . Postpartum care following vaginal delivery (9/9) 06/18/2014  . Post-dates pregnancy 06/17/2014  . Palpitations 11/22/2013  . Syncope 11/22/2013  . Orthostatic hypotension 11/22/2013  . ADHD (attention deficit hyperactivity disorder) 12/04/2011    Past Surgical History:  Procedure Laterality Date  . DILATION AND CURETTAGE OF UTERUS    . LEEP    . TONSILLECTOMY      OB History    Gravida Para Term Preterm AB Living   2 1 1   1 1    SAB TAB Ectopic Multiple Live Births     1     1       Home Medications    Prior to Admission medications   Medication Sig Start Date End Date Taking? Authorizing Provider  amphetamine-dextroamphetamine (ADDERALL) 20 MG tablet Take 20 mg by mouth daily.   Yes Historical Provider, MD  DULoxetine (CYMBALTA) 60 MG capsule Take 60 mg by mouth daily.   Yes Historical Provider, MD  amoxicillin (AMOXIL) 875 MG tablet Take 1 tablet (875 mg total) by mouth 2 (two) times daily. 01/28/17   Robyn Haber, MD  NIFEdipine (PROCARDIA-XL/ADALAT-CC/NIFEDICAL-XL) 30 MG 24 hr tablet Take 30 mg by mouth daily.    Historical Provider, MD  Prenatal Vit-Fe Fumarate-FA (PRENATAL MULTIVITAMIN) TABS tablet Take 1 tablet by mouth every morning.    Historical Provider, MD    Family History Family  History  Problem Relation Age of Onset  . Heart murmur Mother   . Diabetes Mother   . Hypertension Father   . Hyperlipidemia Father   . Breast cancer Paternal Grandmother   . Hypertension Paternal Grandmother   . Hyperlipidemia Paternal Grandmother   . Heart disease Paternal Grandmother   . Hypertension Paternal Grandfather   . Heart disease Paternal Grandfather     CABG  . Heart attack Paternal Grandfather     X4  . Hyperlipidemia Paternal Grandfather     Social History Social History  Substance Use Topics  . Smoking status: Former Research scientist (life sciences)  . Smokeless tobacco: Never Used  . Alcohol use No     Comment: socially     Allergies   Prednisone   Review of Systems Review of Systems  Constitutional: Positive for chills and fever.  HENT: Positive for ear pain and sore throat.   Respiratory: Negative.   Musculoskeletal: Positive for myalgias.  Neurological: Negative.      Physical Exam Triage Vital Signs ED Triage Vitals  Enc Vitals Group     BP 01/28/17 1742 (!) 120/59     Pulse Rate 01/28/17 1742 84     Resp --      Temp 01/28/17 1742 98.6 F (37 C)  Temp Source 01/28/17 1742 Oral     SpO2 01/28/17 1742 100 %     Weight --      Height --      Head Circumference --      Peak Flow --      Pain Score 01/28/17 1744 5     Pain Loc --      Pain Edu? --      Excl. in [place]? --    No data found.   Updated Vital Signs BP (!) 120/59 (BP Location: Right Arm)   Pulse 84   Temp 98.6 F (37 C) (Oral)   LMP 01/03/2017 (Exact Date)   SpO2 100%    Physical Exam  Constitutional: She is oriented to person, place, and time. She appears well-developed and well-nourished.  HENT:  Head: Normocephalic.  Right Ear: External ear normal.  Left Ear: External ear normal.  Erythematous right posterior pharynx  Eyes: Conjunctivae and EOM are normal. Pupils are equal, round, and reactive to light.  Neck: Normal range of motion. Neck supple.  Cardiovascular: Normal rate.     Pulmonary/Chest: Effort normal.  Musculoskeletal: Normal range of motion.  Lymphadenopathy:    She has cervical adenopathy.  Neurological: She is alert and oriented to person, place, and time.  Skin: Skin is warm and dry.  Nursing note and vitals reviewed.    UC Treatments / Results  Labs (all labs ordered are listed, but only abnormal results are displayed) Labs Reviewed - No data to display  EKG  EKG Interpretation None       Radiology No results found.  Procedures Procedures (including critical care time)  Medications Ordered in UC Medications - No data to display   Initial Impression / Assessment and Plan / UC Course  I have reviewed the triage vital signs and the nursing notes.  Pertinent labs & imaging results that were available during my care of the patient were reviewed by me and considered in my medical decision making (see chart for details).     Final Clinical Impressions(s) / UC Diagnoses   Final diagnoses:  Strep throat    New Prescriptions New Prescriptions   AMOXICILLIN (AMOXIL) 875 MG TABLET    Take 1 tablet (875 mg total) by mouth 2 (two) times daily.     Robyn Haber, MD 01/28/17 [id number]

## 2017-02-23 MED FILL — DEXTROAMP-AMPHETAMIN 20 MG: 20 | 30 days supply | Qty: 60 | Fill #0

## 2017-02-23 MED FILL — DULoxetine HCL 60 MG CPEP: 60 | 30 days supply | Qty: 30 | Fill #2

## 2017-03-08 DIAGNOSIS — F419 Anxiety disorder, unspecified: Secondary | ICD-10-CM | POA: Diagnosis not present

## 2017-03-08 DIAGNOSIS — F900 Attention-deficit hyperactivity disorder, predominantly inattentive type: Secondary | ICD-10-CM | POA: Diagnosis not present

## 2017-03-08 DIAGNOSIS — F322 Major depressive disorder, single episode, severe without psychotic features: Secondary | ICD-10-CM | POA: Diagnosis not present

## 2017-03-08 MED FILL — SERTRALINE HCL 50 MG TABLET: 50 | 30 days supply | Qty: 30 | Fill #0

## 2017-03-08 MED FILL — DULoxetine HCL 30 MG CPEP: 30 | 20 days supply | Qty: 15 | Fill #0

## 2017-03-08 MED FILL — VYVANSE 30 MG CAPSULE: 30 | 30 days supply | Qty: 30 | Fill #0

## 2017-03-27 MED FILL — DEXTROAMP-AMPHETAMIN 20 MG: 20 | 30 days supply | Qty: 60 | Fill #0

## 2017-04-07 MED FILL — VYVANSE 30 MG CAPSULE: 30 | 30 days supply | Qty: 30 | Fill #0

## 2017-04-07 MED FILL — SERTRALINE HCL 50 MG TABLET: 50 | 30 days supply | Qty: 30 | Fill #1

## 2017-04-26 DIAGNOSIS — F419 Anxiety disorder, unspecified: Secondary | ICD-10-CM | POA: Diagnosis not present

## 2017-04-26 DIAGNOSIS — F900 Attention-deficit hyperactivity disorder, predominantly inattentive type: Secondary | ICD-10-CM | POA: Diagnosis not present

## 2017-04-26 DIAGNOSIS — F322 Major depressive disorder, single episode, severe without psychotic features: Secondary | ICD-10-CM | POA: Diagnosis not present

## 2017-04-28 MED FILL — DEXTROAMP-AMPHETAMIN 20 MG: 20 | 30 days supply | Qty: 30 | Fill #0

## 2017-04-28 MED FILL — SERTRALINE HCL 50 MG TABLET: 50 | 30 days supply | Qty: 60 | Fill #0

## 2017-05-09 MED FILL — VYVANSE 30 MG CAPSULE: 30 | 30 days supply | Qty: 30 | Fill #0

## 2017-06-01 MED FILL — DEXTROAMP-AMPHETAMIN 20 MG: 20 | 30 days supply | Qty: 30 | Fill #0

## 2017-06-01 MED FILL — SERTRALINE HCL 50 MG TABLET: 50 | 30 days supply | Qty: 60 | Fill #1

## 2017-06-13 MED FILL — VYVANSE 40 MG CAPSULE: 40 | 30 days supply | Qty: 30 | Fill #0

## 2017-07-03 MED FILL — SERTRALINE HCL 50 MG TABLET: 50 | 30 days supply | Qty: 60 | Fill #2

## 2017-07-04 MED FILL — DEXTROAMP-AMPHETAMIN 20 MG: 20 | 30 days supply | Qty: 30 | Fill #0

## 2017-07-24 MED FILL — VYVANSE 40 MG CAPSULE: 40 | 30 days supply | Qty: 30 | Fill #0

## 2017-08-03 MED FILL — SERTRALINE HCL 50 MG TABLET: 50 | 30 days supply | Qty: 60 | Fill #3

## 2017-08-04 MED FILL — AMPHETAMINE SALTS 20 MG TAB: 20 | 30 days supply | Qty: 30 | Fill #0

## 2017-08-24 MED FILL — VYVANSE 40 MG CAPSULE: 40 | 30 days supply | Qty: 30 | Fill #0

## 2017-10-18 ENCOUNTER — Other Ambulatory Visit (HOSPITAL_COMMUNITY): Payer: Self-pay | Admitting: *Deleted

## 2017-10-18 DIAGNOSIS — N6452 Nipple discharge: Secondary | ICD-10-CM

## 2017-10-19 ENCOUNTER — Ambulatory Visit
Admission: RE | Admit: 2017-10-19 | Discharge: 2017-10-19 | Disposition: A | Discharge status: Home / Self Care | Payer: No Typology Code available for payment source | Source: Ambulatory Visit | Attending: Obstetrics and Gynecology | Admitting: Obstetrics and Gynecology

## 2017-10-19 ENCOUNTER — Encounter (HOSPITAL_COMMUNITY): Payer: Self-pay

## 2017-10-19 ENCOUNTER — Ambulatory Visit (HOSPITAL_COMMUNITY)
Admission: RE | Admit: 2017-10-19 | Discharge: 2017-10-19 | Disposition: A | Discharge status: Home / Self Care | Payer: Self-pay | Source: Ambulatory Visit | Attending: Obstetrics and Gynecology | Admitting: Obstetrics and Gynecology

## 2017-10-19 VITALS — BP 118/64 | Temp 98.5°F | Ht 66.0 in | Wt 172.4 lb

## 2017-10-19 DIAGNOSIS — N6452 Nipple discharge: Secondary | ICD-10-CM

## 2017-10-19 DIAGNOSIS — Z01419 Encounter for gynecological examination (general) (routine) without abnormal findings: Secondary | ICD-10-CM

## 2017-10-19 HISTORY — DX: Nipple discharge: N64.52

## 2017-10-19 HISTORY — DX: Major depressive disorder, single episode, unspecified: F32.9

## 2017-10-19 HISTORY — DX: Depression, unspecified: F32.A

## 2017-10-19 HISTORY — DX: Anxiety disorder, unspecified: F41.9

## 2017-10-19 NOTE — Patient Instructions (Signed)
Explained breast self awareness with Sharon Hartman. Let patient know that her next Pap smear will be due based on the result of today's Pap smear. Referred patient to the [facility] for a diagnostic mammogram and possible bilateral breast ultrasounds. Appointment scheduled for Thursday, October 19, 2017 at 1520. Let patient know will follow up with her within the next couple weeks with results of Pap smear and breast discharge by phone. Sharon Hartman verbalized understanding.  Agustina Witzke, Arvil Chaco, RN 3:42 PM

## 2017-10-19 NOTE — Progress Notes (Signed)
Complaints of left breast leaking blood 3-4 months ago that was spontaneous.  Pap Smear: Pap smear completed today. Last Pap smear was in 2016 at [facility] and normal per patient. Patient's previous Pap smear was 07/17/2013 at [facility] and normal. Per patient has a history of an abnormal Pap smear in 2006 that a colposcopy and LEEP were completed. Patient stated all Pap smears have been normal since LEEP. Last Pap smear result is not in Epic. Previous Pap smear 07/17/2013 is in [place].  Physical exam: Breasts Breasts symmetrical. No skin abnormalities bilateral breasts. No nipple retraction bilateral breasts. Bilateral nipple discharge expressed on exam. A clear colored discharge expressed from left breast and milky colored discharge from right breast. Samples ob bilateral breast discharge sent to cytology for evaluation. No lymphadenopathy. No lumps palpated bilateral breasts. No complaints of pain or tenderness on exam. Referred patient to the [place] for a diagnostic mammogram and possible bilateral breast ultrasounds. Appointment scheduled for Thursday, October 19, 2017 at 1520.  Pelvic/Bimanual   Ext Genitalia No lesions, no swelling and no discharge observed on external genitalia.         Vagina Vagina pink and normal texture. No lesions or discharge observed in vagina.          Cervix Cervix is present. Cervix pink and reddened around cervical os. Cervix friable. No discharge observed.     Uterus Uterus is present and palpable. Uterus in normal position and normal size.        Adnexae Bilateral ovaries present and palpable. No tenderness on palpation.          Rectovaginal No rectal exam completed today since patient had no rectal complaints. No skin abnormalities observed on exam.    Smoking History: Patient is a former smoker that quit 12 years ago.  Patient Navigation: Patient education provided. Access to services provided for patient through  [facility] program.

## 2017-10-23 LAB — CYTOLOGY - PAP
Diagnosis: NEGATIVE
Diagnosis: REACTIVE
HPV: NOT DETECTED

## 2017-10-27 ENCOUNTER — Encounter (HOSPITAL_COMMUNITY): Payer: Self-pay | Admitting: *Deleted

## 2017-11-21 ENCOUNTER — Encounter (HOSPITAL_COMMUNITY): Payer: Self-pay | Admitting: *Deleted

## 2017-11-21 NOTE — Progress Notes (Signed)
Letter Mailed with negative pap smear results, Next pap smear due in five years. Letter mailed with benign breast discharge results.

## 2018-06-21 DIAGNOSIS — Z23 Encounter for immunization: Secondary | ICD-10-CM | POA: Diagnosis not present

## 2018-10-16 DIAGNOSIS — E7800 Pure hypercholesterolemia, unspecified: Secondary | ICD-10-CM | POA: Diagnosis not present

## 2018-10-16 DIAGNOSIS — Z3200 Encounter for pregnancy test, result unknown: Secondary | ICD-10-CM | POA: Diagnosis not present

## 2019-05-31 ENCOUNTER — Other Ambulatory Visit: Payer: Self-pay | Admitting: Obstetrics and Gynecology

## 2019-06-04 ENCOUNTER — Emergency Department (HOSPITAL_COMMUNITY)
Admission: EM | Admit: 2019-06-04 | Discharge: 2019-06-05 | Disposition: A | Discharge status: Home / Self Care | Payer: Commercial Managed Care - PPO | Attending: Emergency Medicine | Admitting: Emergency Medicine

## 2019-06-04 ENCOUNTER — Other Ambulatory Visit: Payer: Self-pay

## 2019-06-04 ENCOUNTER — Encounter (HOSPITAL_COMMUNITY): Payer: Self-pay

## 2019-06-04 DIAGNOSIS — Z79899 Other long term (current) drug therapy: Secondary | ICD-10-CM | POA: Insufficient documentation

## 2019-06-04 DIAGNOSIS — R112 Nausea with vomiting, unspecified: Secondary | ICD-10-CM | POA: Diagnosis not present

## 2019-06-04 DIAGNOSIS — Z87891 Personal history of nicotine dependence: Secondary | ICD-10-CM | POA: Insufficient documentation

## 2019-06-04 DIAGNOSIS — R1013 Epigastric pain: Secondary | ICD-10-CM | POA: Insufficient documentation

## 2019-06-04 DIAGNOSIS — R1011 Right upper quadrant pain: Secondary | ICD-10-CM | POA: Insufficient documentation

## 2019-06-04 LAB — CBC WITH DIFFERENTIAL/PLATELET
Abs Immature Granulocytes: 0.01 10*3/uL (ref 0.00–0.07)
Basophils Absolute: 0 10*3/uL (ref 0.0–0.1)
Basophils Relative: 1 %
Eosinophils Absolute: 0.1 10*3/uL (ref 0.0–0.5)
Eosinophils Relative: 1 %
HCT: 40.3 % (ref 36.0–46.0)
Hemoglobin: 13.6 g/dL (ref 12.0–15.0)
Immature Granulocytes: 0 %
Lymphocytes Relative: 55 %
Lymphs Abs: 4.2 10*3/uL — ABNORMAL HIGH (ref 0.7–4.0)
MCH: 30.8 pg (ref 26.0–34.0)
MCHC: 33.7 g/dL (ref 30.0–36.0)
MCV: 91.4 fL (ref 80.0–100.0)
Monocytes Absolute: 0.3 10*3/uL (ref 0.1–1.0)
Monocytes Relative: 5 %
Neutro Abs: 2.8 10*3/uL (ref 1.7–7.7)
Neutrophils Relative %: 38 %
Platelets: 316 10*3/uL (ref 150–400)
RBC: 4.41 MIL/uL (ref 3.87–5.11)
RDW: 11.6 % (ref 11.5–15.5)
WBC: 7.4 10*3/uL (ref 4.0–10.5)
nRBC: 0 % (ref 0.0–0.2)

## 2019-06-04 LAB — I-STAT BETA HCG BLOOD, ED (MC, WL, AP ONLY): I-stat hCG, quantitative: <5 m[IU]/mL (ref <5)

## 2019-06-04 MED ORDER — ONDANSETRON HCL 4 MG/2ML IJ SOLN
4.0000 mg | Freq: Once | INTRAMUSCULAR | Status: AC
  Administered 2019-06-04: 23:00:00 4 mg via INTRAVENOUS
  Filled 2019-06-04: qty 2

## 2019-06-04 MED ORDER — SODIUM CHLORIDE 0.9 % IV BOLUS
1000.0000 mL | Freq: Once | INTRAVENOUS | Status: AC
  Administered 2019-06-04: 1000 mL via INTRAVENOUS

## 2019-06-04 MED ORDER — MORPHINE SULFATE (PF) 4 MG/ML IV SOLN
4.0000 mg | Freq: Once | INTRAVENOUS | Status: AC
  Administered 2019-06-04: 23:00:00 4 mg via INTRAVENOUS
  Filled 2019-06-04: qty 1

## 2019-06-04 NOTE — ED Triage Notes (Signed)
Pt complains of right upper quadrant pain for two days and vomiting yesterday, she has a hx of gallstones and the pain feels the same, she had an ultrasound today but no results

## 2019-06-04 NOTE — ED Provider Notes (Signed)
[place] DEPT Provider Note   CSN: [id number] Arrival date & time: 06/04/19  1906     History   Chief Complaint Chief Complaint  Patient presents with  . Abdominal Pain    HPI Sharon Hartman is a 38 y.o. female.     The history is provided by the patient and medical records.  Abdominal Pain Associated symptoms: nausea and vomiting     38 y.o. F with hx of anxiety, depression, palpitations, ADHD, fatty liver, presenting to the ED for abdominal pain.  Patient reports she has been having abdominal pain for the past 2 days localized to epigastrium and RUQ.  States she has severe nausea and intermittent vomiting as well.  She did have RUQ [place] this morning at imaging center but did not yet have results.  She was trying to wait to see her PCP for follow-up but pain and vomiting became more severe so decided to come in.  She reports she last tried to eat about 4pm (vegan lasagna) and threw it right back up.  She denies fever/chills/sweats.  No prior abdominal surgeries.  Past Medical History:  Diagnosis Date  . Anxiety   . Breast discharge    bil discharge greater on left/ bloody  . Depression   . Medical history non-contributory   . Vaginal Pap smear, abnormal     Patient Active Problem List   Diagnosis Date Noted  . Postpartum care following vaginal delivery (9/9) 06/18/2014  . Post-dates pregnancy 06/17/2014  . Palpitations 11/22/2013  . Syncope 11/22/2013  . Orthostatic hypotension 11/22/2013  . ADHD (attention deficit hyperactivity disorder) 12/04/2011    Past Surgical History:  Procedure Laterality Date  . DILATION AND CURETTAGE OF UTERUS    . LEEP    . TONSILLECTOMY       OB History    Gravida  2   Para  1   Term  1   Preterm      AB  1   Living  1     SAB      TAB  1   Ectopic      Multiple      Live Births  1            Home Medications    Prior to Admission medications   Medication Sig Start Date  End Date Taking? Authorizing Provider  amoxicillin (AMOXIL) 875 MG tablet Take 1 tablet (875 mg total) by mouth 2 (two) times daily. Patient not taking: Reported on 10/19/2017 01/28/17   Robyn Haber, MD  amphetamine-dextroamphetamine (ADDERALL) 20 MG tablet Take 20 mg by mouth daily.    [provider]  atomoxetine (STRATTERA) 40 MG capsule Take 40 mg by mouth daily.    [provider]  DULoxetine (CYMBALTA) 60 MG capsule Take 60 mg by mouth daily.    [provider]  NIFEdipine (PROCARDIA-XL/ADALAT-CC/NIFEDICAL-XL) 30 MG 24 hr tablet Take 30 mg by mouth daily.    [provider]  Prenatal Vit-Fe Fumarate-FA (PRENATAL MULTIVITAMIN) TABS tablet Take 1 tablet by mouth every morning.    [provider]  sertraline (ZOLOFT) 50 MG tablet Take 50 mg by mouth daily.    [provider]    Family History Family History  Problem Relation Age of Onset  . Heart murmur Mother   . Diabetes Mother   . Breast cancer Paternal Grandmother   . Hypertension Paternal Grandmother   . Hyperlipidemia Paternal Grandmother   . Heart disease  Paternal Grandmother   . Hypertension Paternal Grandfather   . Heart disease Paternal Grandfather        CABG  . Heart attack Paternal Grandfather        X4  . Hyperlipidemia Paternal Grandfather   . Hypertension Father   . Hyperlipidemia Father     Social History Social History   Tobacco Use  . Smoking status: Former Research scientist (life sciences)  . Smokeless tobacco: Never Used  Substance Use Topics  . Alcohol use: No    Alcohol/week: 1.0 standard drinks    Types: 1 Glasses of wine per week    Comment: socially  . Drug use: No     Allergies   Prednisone   Review of Systems Review of Systems  Gastrointestinal: Positive for abdominal pain, nausea and vomiting.  All other systems reviewed and are negative.    Physical Exam Updated Vital Signs BP 126/78 (BP Location: Right Arm)   Pulse 77   Temp 98.4 F (36.9 C)  (Oral)   Ht 5\' 6"  (1.676 m)   Wt 76.2 kg   LMP 05/28/2019   SpO2 100%   BMI 27.12 kg/m   Physical Exam Vitals signs and nursing note reviewed.  Constitutional:      Appearance: She is well-developed.  HENT:     Head: Normocephalic and atraumatic.  Eyes:     Conjunctiva/sclera: Conjunctivae normal.     Pupils: Pupils are equal, round, and reactive to light.  Neck:     Musculoskeletal: Normal range of motion.  Cardiovascular:     Rate and Rhythm: Normal rate and regular rhythm.     Heart sounds: Normal heart sounds.  Pulmonary:     Effort: Pulmonary effort is normal.     Breath sounds: Normal breath sounds.  Abdominal:     General: Bowel sounds are normal.     Palpations: Abdomen is soft.     Tenderness: There is abdominal tenderness in the right upper quadrant. There is no guarding or rebound.  Musculoskeletal: Normal range of motion.  Skin:    General: Skin is warm and dry.  Neurological:     Mental Status: She is alert and oriented to person, place, and time.      ED Treatments / Results  Labs (all labs ordered are listed, but only abnormal results are displayed) Labs Reviewed  CBC WITH DIFFERENTIAL/PLATELET - Abnormal; Notable for the following components:      Result Value   Lymphs Abs 4.2 (*)    All other components within normal limits  COMPREHENSIVE METABOLIC PANEL  LIPASE, BLOOD  I-STAT BETA HCG BLOOD, ED ([facility], [facility], [facility] ONLY)    EKG None  Radiology No results found.   IMPRESSION: 1. No acute finding. 2. Hepatic steatosis with regions of focal fatty sparing.  Electronically Signed by: Shara Blazing  Result Narrative  TECHNIQUE: Realtime multiplanar grayscale and limited color Doppler ultrasound of the right upper quadrant was performed. COMPARISON: None.  INDICATION: Right upper quadrant pain  FINDINGS:  PANCREAS:  No focal abnormalities are identified. Visualization is limited.  VASCULATURE: No abdominal aortic aneurysm. Visualized IVC  is patent. Portal vein is patent with normal flow direction.  HEPATOBILIARY: Liver: Mild hepatic steatosis. Regions of focal fatty sparing. Gallbladder: Unremarkable. CBD: Normal. No intrahepatic biliary ductal dilatation.  RIGHT KIDNEY: Right kidney is of normal size. No hydronephrosis. No suspicious masses. Normal renal echotexture.  Other Result Information  Acute Interface, Incoming Rad Results - 06/04/2019  1:58 PM EDT TECHNIQUE: Realtime  multiplanar grayscale and limited color Doppler ultrasound of the right upper quadrant was performed. COMPARISON: None.   INDICATION: Right upper quadrant pain  FINDINGS:  PANCREAS:  No focal abnormalities are identified. Visualization is limited.  VASCULATURE: No abdominal aortic aneurysm. Visualized IVC is patent. Portal vein is patent with normal flow direction.  HEPATOBILIARY: Liver: Mild hepatic steatosis.  Regions of focal fatty sparing. Gallbladder: Unremarkable. CBD: Normal. No intrahepatic biliary ductal dilatation.  RIGHT KIDNEY: Right kidney is of normal size. No hydronephrosis. No suspicious masses. Normal renal echotexture.   IMPRESSION: 1.  No acute finding. 2.  Hepatic steatosis with regions of focal fatty sparing.  Electronically Signed by: Shara Blazing     Procedures Procedures (including critical care time)  Medications Ordered in ED Medications  morphine 4 MG/ML injection 4 mg (4 mg Intravenous Given 06/04/19 2328)  ondansetron (ZOFRAN) injection 4 mg (4 mg Intravenous Given 06/04/19 2328)  sodium chloride 0.9 % bolus 1,000 mL (1,000 mLs Intravenous New Bag/Given 06/04/19 2328)     Initial Impression / Assessment and Plan / ED Course  I have reviewed the triage vital signs and the nursing notes.  Pertinent labs & imaging results that were available during my care of the patient were reviewed by me and considered in my medical decision making (see chart for details).  39 year old female who  with epigastric and right upper quadrant pain for the past 2 days.  Has history of gallstones and reports this feels similar.  She had an outpatient ultrasound earlier this afternoon but does not yet have results.  She came in tonight due to worsening pain and vomiting.  She is afebrile and nontoxic, no acute distress.  Vitals are stable.  She does have tenderness in the epigastrium and right upper quadrant but no peritoneal signs or Murphy sign.  I was able to view report from her ultrasound earlier today and this is negative aside from fatty liver changes which are known.  We will check screening labs here, give IV fluids and attempt pain control.  12:57 AM Patient feeling much better here after medications.  Her labs are reassuring-- normal LFT's, lipase, and WBC count.  VSS.  Given negative [place] from earlier and normal labs here, feel she is stable for discharge.  May ultimately need a HIDA scan-- will talk with PCP to arrange this.  Rx percocet, zofran, continue modified diet.  She will return here for any new/acute changes.  Final Clinical Impressions(s) / ED Diagnoses   Final diagnoses:  RUQ pain    ED Discharge Orders         Ordered    oxyCODONE-acetaminophen (PERCOCET) 5-325 MG tablet  Every 4 hours PRN     06/05/19 0058    ondansetron (ZOFRAN ODT) 4 MG disintegrating tablet  Every 8 hours PRN     06/05/19 0058           Larene Pickett, PA-C 06/05/19 0104    Varney Biles, MD 06/11/19 1329

## 2019-06-05 LAB — COMPREHENSIVE METABOLIC PANEL
ALT: 18 U/L (ref 0–44)
AST: 17 U/L (ref 15–41)
Albumin: 4.4 g/dL (ref 3.5–5.0)
Alkaline Phosphatase: 40 U/L (ref 38–126)
Anion gap: 10 (ref 5–15)
BUN: 8 mg/dL (ref 6–20)
CO2: 22 mmol/L (ref 22–32)
Calcium: 9.7 mg/dL (ref 8.9–10.3)
Chloride: 109 mmol/L (ref 98–111)
Creatinine, Ser: 0.61 mg/dL (ref 0.44–1.00)
GFR calc Af Amer: >60 mL/min (ref >60)
GFR calc non Af Amer: >60 mL/min (ref >60)
Glucose, Bld: 99 mg/dL (ref 70–99)
Potassium: 3.7 mmol/L (ref 3.5–5.1)
Sodium: 141 mmol/L (ref 135–145)
Total Bilirubin: 0.6 mg/dL (ref 0.3–1.2)
Total Protein: 7.6 g/dL (ref 6.5–8.1)

## 2019-06-05 LAB — LIPASE, BLOOD: Lipase: 30 U/L (ref 11–51)

## 2019-06-05 MED ORDER — OXYCODONE-ACETAMINOPHEN 5-325 MG PO TABS: 1.0000 | ORAL_TABLET | ORAL | 0 refills | Status: DC | PRN

## 2019-06-05 MED ORDER — ONDANSETRON 4 MG PO TBDP: 4.0000 mg | ORAL_TABLET | Freq: Three times a day (TID) | ORAL | 0 refills | Status: DC | PRN

## 2019-06-05 NOTE — Discharge Instructions (Addendum)
Labs today are reassuring.  Ultrasound from earlier in the day is normal as well aside from the fatty liver. You ultimately may need HIDA scan which we have discussed. Take the prescribed medication as directed.  Do not drive while taking pain medication. Follow-up with your primary care doctor for ongoing management. Return to the ED for new or worsening symptoms.

## 2019-06-24 NOTE — Progress Notes (Signed)
Office Visit Note  Patient: Sharon Hartman             Date of Birth: [birth year]           MRN: [id number]             PCP: Donald Prose, MD Referring: Gustavo Lah, NP Visit Date: 06/25/2019 Occupation: RN at [employer]  Subjective:  Raynaud's.   History of Present Illness: Sharon Hartman is a 38 y.o. female seen in consultation per request of her GYN.  According to patient she has had history of fatigue since she was in her early 70s.  She was very busy with her nursing school and ignored her symptoms.  She states she had flulike symptoms since then which will occur every 2 to 4 weeks.  She was diagnosed with depression at age 80 and had been on antidepressants since then.  She states in 2015 during the postpartum.  She developed severe Raynauds on her nipples and she had difficulty nursing.  At that time she was started on Procardia which she took only during the nursing..  She states over the years she continues to have pain in her joints and muscles which move around.  For the last 4 to 5 months she has been having increased symptoms of Raynolds in her feet.  She noticed some Raynaud's in her nipples again this morning.  She is also noticed a rash on her face.  She continues to have some generalized pain and lower back pain.  She states she has had recurrent problems with carpal tunnel syndrome since her last pregnancy in 2015.  She states about a month ago she developed right upper quadrant pain for which she was seen at the hospital and was given prednisone with possible suspicion of pleuritis.  Her symptoms resolved while she was on prednisone.  Activities of Daily Living:  Patient reports morning stiffness for 1-2 hours.   Patient Denies nocturnal pain.  Difficulty dressing/grooming: Denies Difficulty climbing stairs: Denies Difficulty getting out of chair: Denies Difficulty using hands for taps, buttons, cutlery, and/or writing: Denies  Review of Systems  Constitutional: Positive  for fatigue. Negative for night sweats, weight gain and weight loss.  HENT: Positive for mouth dryness. Negative for mouth sores, trouble swallowing, trouble swallowing and nose dryness.   Eyes: Positive for dryness. Negative for pain, redness and visual disturbance.  Respiratory: Negative for cough, shortness of breath and difficulty breathing.   Cardiovascular: Positive for palpitations. Negative for chest pain, hypertension, irregular heartbeat and swelling in legs/feet.  Gastrointestinal: Positive for abdominal pain. Negative for blood in stool, constipation and diarrhea.  Endocrine: Negative for increased urination.  Genitourinary: Negative for vaginal dryness.  Musculoskeletal: Positive for arthralgias, joint pain, myalgias, morning stiffness and myalgias. Negative for joint swelling, muscle weakness and muscle tenderness.  Skin: Positive for color change and sensitivity to sunlight. Negative for rash, hair loss, skin tightness and ulcers.  Allergic/Immunologic: Negative for susceptible to infections.  Neurological: Negative for dizziness, memory loss, night sweats and weakness.  Hematological: Negative for swollen glands.  Psychiatric/Behavioral: Positive for depressed mood and sleep disturbance. The patient is nervous/anxious.     PMFS History:  Patient Active Problem List   Diagnosis Date Noted  . Postpartum care following vaginal delivery (9/9) 06/18/2014  . Post-dates pregnancy 06/17/2014  . Palpitations 11/22/2013  . Syncope 11/22/2013  . Orthostatic hypotension 11/22/2013  . ADHD (attention deficit hyperactivity disorder) 12/04/2011    Past Medical History:  Diagnosis  Date  . Anxiety   . Breast discharge    bil discharge greater on left/ bloody  . Depression   . Medical history non-contributory   . Vaginal Pap smear, abnormal     Family History  Problem Relation Age of Onset  . Heart murmur Mother   . Diabetes Mother   . Hepatitis Mother        auto immune   .  Breast cancer Paternal Grandmother   . Hypertension Paternal Grandmother   . Hyperlipidemia Paternal Grandmother   . Heart disease Paternal Grandmother   . Hypertension Paternal Grandfather   . Heart disease Paternal Grandfather        CABG  . Heart attack Paternal Grandfather        X4  . Hyperlipidemia Paternal Grandfather   . Hypertension Father   . Hyperlipidemia Father   . Hashimoto's thyroiditis Sister   . Depression Sister   . Anxiety disorder Sister   . Scoliosis Brother   . Drug abuse Brother   . Autism Son    Past Surgical History:  Procedure Laterality Date  . DILATION AND CURETTAGE OF UTERUS    . GANGLION CYST EXCISION Right 2011   foot  . LEEP    . TONSILLECTOMY    . [place] EXTRACTION  1999   Social History   Social History Narrative  . Not on file   Immunization History  Administered Date(s) Administered  . PPD Test 02/13/2012  . Tdap 02/13/2012     Objective: Vital Signs: BP 111/78 (BP Location: Right Arm, Patient Position: Sitting, Cuff Size: Normal)   Pulse 78   Resp 12   Ht 5' 6.5" (1.689 m)   Wt 172 lb (78 kg)   LMP 05/28/2019   BMI 27.35 kg/m    Physical Exam Vitals signs and nursing note reviewed.  Constitutional:      Appearance: She is well-developed.  HENT:     Head: Normocephalic and atraumatic.  Eyes:     Conjunctiva/sclera: Conjunctivae normal.  Neck:     Musculoskeletal: Normal range of motion.  Cardiovascular:     Rate and Rhythm: Normal rate and regular rhythm.     Heart sounds: Normal heart sounds.  Pulmonary:     Effort: Pulmonary effort is normal.     Breath sounds: Normal breath sounds.  Abdominal:     General: Bowel sounds are normal.     Palpations: Abdomen is soft.  Lymphadenopathy:     Cervical: No cervical adenopathy.  Skin:    General: Skin is warm and dry.     Capillary Refill: Capillary refill takes less than 2 seconds.  Neurological:     Mental Status: She is alert and oriented to person, place,  and time.  Psychiatric:        Behavior: Behavior normal.      Musculoskeletal Exam: C-spine thoracic and lumbar spine with good range of motion.  Shoulder joints, elbow joints, wrist joints, MCPs and PIPs been good range of motion with no synovitis.  Hip joints, knee joints, ankles, MTPs and PIPs with good range of motion with no synovitis.  She had good capillary refill.  No digital ulcers were noted.  CDAI Exam: CDAI Score: - Patient Global: -; Provider Global: - Swollen: -; Tender: - Joint Exam   No joint exam has been documented for this visit   There is currently no information documented on the homunculus. Go to the Rheumatology activity and complete the homunculus joint  exam.  Investigation: No additional findings.  Imaging: No results found.  Recent Labs: Lab Results  Component Value Date   WBC 7.4 06/04/2019   HGB 13.6 06/04/2019   PLT 316 06/04/2019   NA 141 06/04/2019   K 3.7 06/04/2019   CL 109 06/04/2019   CO2 22 06/04/2019   GLUCOSE 99 06/04/2019   BUN 8 06/04/2019   CREATININE 0.61 06/04/2019   BILITOT 0.6 06/04/2019   ALKPHOS 40 06/04/2019   AST 17 06/04/2019   ALT 18 06/04/2019   PROT 7.6 06/04/2019   ALBUMIN 4.4 06/04/2019   CALCIUM 9.7 06/04/2019   GFRAA >60 06/04/2019    Speciality Comments: No specialty comments available.  Procedures:  No procedures performed Allergies: Patient has no known allergies.   Assessment / Plan:     Visit Diagnoses: Raynaud's disease without gangrene - 05/31/19: WBC WNL, RBC/hgb/hct WNL, thyroid peroxidase ab-, thyrogobulin ab-, FSH 7.9, prolactin 7.36 -patient complains of severe Raynauds involving her feet mostly.  She is also had episodic Raynauds in her nipples which is painful.  She brought several pictures of her feet which showed pallor in her toes with hyperemia.  She has never developed tissue ulcers.  She states she was given Procardia in the postpartum.  For nursing as she had severe Raynaud's in her  nipples.  She also gives history of intermittent rash in her face and photosensitivity.  We will obtain following labs today.  Plan: Anti-DNA antibody, double-stranded, C3 and C4, Beta-2 glycoprotein antibodies, Cardiolipin antibodies, IgG, IgM, IgA, Lupus Anticoagulant Eval w/Reflex, Angiotensin converting enzyme, Pan-ANCA, Cryoglobulin, Hepatitis B core antibody, IgM, Hepatitis B surface antigen, Hepatitis C antibody  Other fatigue -she gives history of fatigue for many years.  Plan: Urinalysis, Routine w reflex microscopic, Sedimentation rate, Glucose 6 phosphate dehydrogenase, CK  Polyarthralgia -she complains of migratory polyarthralgia.  She does not recall having joint swelling.  Plan: Rheumatoid factor, Cyclic citrul peptide antibody, IgG  Sicca syndrome, unspecified ([place]) -she gives history of sicca symptoms for many years.  Plan: ANA, Anti-scleroderma antibody, RNP Antibody, Anti-Smith antibody, Sjogrens syndrome-A extractable nuclear antibody, Sjogrens syndrome-B extractable nuclear antibody  Orthostatic hypotension  Palpitations  Fatty liver  Anxiety and depression  History of ADHD  Orders: Orders Placed This Encounter  Procedures  . Urinalysis, Routine w reflex microscopic  . Sedimentation rate  . Rheumatoid factor  . ANA  . Anti-scleroderma antibody  . RNP Antibody  . Anti-Smith antibody  . Sjogrens syndrome-A extractable nuclear antibody  . Sjogrens syndrome-B extractable nuclear antibody  . Anti-DNA antibody, double-stranded  . C3 and C4  . Beta-2 glycoprotein antibodies  . Cardiolipin antibodies, IgG, IgM, IgA  . Lupus Anticoagulant Eval w/Reflex  . Angiotensin converting enzyme  . Pan-ANCA  . Cryoglobulin  . Glucose 6 phosphate dehydrogenase  . Hepatitis B core antibody, IgM  . Hepatitis B surface antigen  . Hepatitis C antibody  . CK  . Cyclic citrul peptide antibody, IgG   No orders of the defined types were placed in this encounter.   Face-to-face  time spent with patient was 50 minutes. Greater than 50% of time was spent in counseling and coordination of care.  Follow-Up Instructions: Return for Raynauds phenomenon.   Bo Merino, MD  Note - This record has been created using Editor, commissioning.  Chart creation errors have been sought, but may not always  have been located. Such creation errors do not reflect on  the standard of medical care.

## 2019-06-25 ENCOUNTER — Ambulatory Visit: Payer: Commercial Managed Care - PPO | Admitting: Rheumatology

## 2019-06-25 ENCOUNTER — Encounter: Payer: Self-pay | Admitting: Rheumatology

## 2019-06-25 ENCOUNTER — Other Ambulatory Visit: Payer: Self-pay

## 2019-06-25 VITALS — BP 111/78 | HR 78 | Resp 12 | Ht 66.5 in | Wt 172.0 lb

## 2019-06-25 DIAGNOSIS — R5383 Other fatigue: Secondary | ICD-10-CM

## 2019-06-25 DIAGNOSIS — F32A Depression, unspecified: Secondary | ICD-10-CM

## 2019-06-25 DIAGNOSIS — M3500 Sicca syndrome, unspecified: Secondary | ICD-10-CM

## 2019-06-25 DIAGNOSIS — M2550 Pain in unspecified joint: Secondary | ICD-10-CM

## 2019-06-25 DIAGNOSIS — I951 Orthostatic hypotension: Secondary | ICD-10-CM

## 2019-06-25 DIAGNOSIS — F329 Major depressive disorder, single episode, unspecified: Secondary | ICD-10-CM

## 2019-06-25 DIAGNOSIS — F419 Anxiety disorder, unspecified: Secondary | ICD-10-CM

## 2019-06-25 DIAGNOSIS — R002 Palpitations: Secondary | ICD-10-CM

## 2019-06-25 DIAGNOSIS — I7300 Raynaud's syndrome without gangrene: Secondary | ICD-10-CM

## 2019-06-25 DIAGNOSIS — Z8659 Personal history of other mental and behavioral disorders: Secondary | ICD-10-CM

## 2019-06-25 DIAGNOSIS — K760 Fatty (change of) liver, not elsewhere classified: Secondary | ICD-10-CM

## 2019-06-27 ENCOUNTER — Encounter: Payer: Self-pay | Admitting: Internal Medicine

## 2019-06-27 ENCOUNTER — Other Ambulatory Visit: Payer: Self-pay

## 2019-06-27 ENCOUNTER — Ambulatory Visit: Payer: Commercial Managed Care - PPO | Admitting: Internal Medicine

## 2019-06-27 VITALS — BP 112/70 | HR 82 | Ht 66.5 in | Wt 170.0 lb

## 2019-06-27 DIAGNOSIS — R5383 Other fatigue: Secondary | ICD-10-CM | POA: Diagnosis not present

## 2019-06-27 DIAGNOSIS — Z8349 Family history of other endocrine, nutritional and metabolic diseases: Secondary | ICD-10-CM | POA: Diagnosis not present

## 2019-06-27 DIAGNOSIS — N6452 Nipple discharge: Secondary | ICD-10-CM

## 2019-06-27 DIAGNOSIS — L659 Nonscarring hair loss, unspecified: Secondary | ICD-10-CM | POA: Diagnosis not present

## 2019-06-27 LAB — VITAMIN D 25 HYDROXY (VIT D DEFICIENCY, FRACTURES): VITD: 22.64 ng/mL — ABNORMAL LOW (ref 30.00–100.00)

## 2019-06-27 LAB — T3, FREE: T3, Free: 3.2 pg/mL (ref 2.3–4.2)

## 2019-06-27 LAB — TSH: TSH: 1.3 u[IU]/mL (ref 0.35–4.50)

## 2019-06-27 LAB — VITAMIN B12: Vitamin B-12: 472 pg/mL (ref 211–911)

## 2019-06-27 LAB — T4, FREE: Free T4: 0.77 ng/dL (ref 0.60–1.60)

## 2019-06-27 MED ORDER — ROSUVASTATIN CALCIUM 5 MG PO TABS: 5.0000 mg | ORAL_TABLET | Freq: Every day | ORAL | 3 refills | Status: DC

## 2019-06-27 NOTE — Progress Notes (Signed)
I will discuss results at the follow-up visit.

## 2019-06-27 NOTE — Progress Notes (Addendum)
Patient ID: Sharon Hartman, female   DOB: 01/22/1981, 38 y.o.   MRN: [id number]    HPI  Sharon Hartman is a 38 y.o.-year-old female, referred by her PCP, Dr. Nancy Fetter, for ? Hashimoto's thyroiditis (patient with family history of thyroiditis), also, fatigue, breast discharge, HL.  She describes increasing headaches in the last 2 months.  She takes medication for headaches 5-7 times a week.  Patient describes a family history of thyroiditis, however, her investigation for thyroid disease was negative.  She had normal TFTs in 05/2019.  I reviewed pt's thyroid tests: 05/30/2019: TSH 1.37, free T3 3.18 (2.5-3.9), total T4 7.2 (4.5-12) No results found for: TSH, FREET4   Pt describes: - + weight gain - + fatigue - no cold intolerance, + hot flushes - + depression - + constipation - + mm and joint pain - + hair loss  Pt denies: - feeling nodules in neck - hoarseness - dysphagia - choking - SOB with lying down  She has + FH of thyroid disorders in: Sister - Hashimoto's hypothyroidism and nodules. No FH of thyroid cancer. + FH of autoimmune ds. In mother >> died of autoimmune hepatitis at 37 y/o. No h/o radiation tx to head or neck. No recent use of iodine supplements.  Patient also describes fatigue, but without associated weight loss, nausea, vomiting, abdominal pain, muscle aches, syncopal episodes, dizziness, dark skin discoloration.    Thyroid tests were normal recently (see above).  On CBC, she has lymphocytic predominance.  No h/o hyponatremia or hyperkalemia.    Chemistry      Component Value Date/Time   NA 141 06/04/2019 2331   K 3.7 06/04/2019 2331   CL 109 06/04/2019 2331   CO2 22 06/04/2019 2331   BUN 8 06/04/2019 2331   CREATININE 0.61 06/04/2019 2331      Component Value Date/Time   CALCIUM 9.7 06/04/2019 2331   ALKPHOS 40 06/04/2019 2331   AST 17 06/04/2019 2331   ALT 18 06/04/2019 2331   BILITOT 0.6 06/04/2019 2331     Pt. also has a history of breast  discharge, investigated by bilateral breast U/S in 10/2017: Yellow-clear discharge bilaterally.  Of note, she did have 1 episode of bloody discharge from the left breast and conservative follow-up was suggested.  She continues to have occasional light-colored discharge from breast and she describes that even when she was breast-feeding, she had some blood in milk.  She was also diagnosed with ray nods disease of the nipples, but she was able to breast-feed her child with the help of the lactation consultant.  I do not have a prolactin level for her but this was reportedly normal. No results found for: PROLACTIN   She does not describe visual problems (blurry vision, double vision, visual field cuts), but she does have a history of headaches.  These are chronic and she is on medications for these.  She does have a history of Raynaud's phenomenon and she is seeing rheumatology.  She also has a history of hyperlipidemia, GERD, ADD, anxiety, depression,  insomnia, gallstones, anemia, low back pain - herniated disc, scoliosis.  HL: -Reviewed latest lipid panel per PCP: 05/28/2019: 206/85/54/190  She is eating a vegan diet: -Breakfast: Coffee with 2 Splenda and granola bar or avocado toast -Lunch: Celery juice and PB + J or vegan soup or spinach salad -Dinner: Veggie pasta or vegan enchiladas or vegan stirfry or salad with veggies -Snacks: Veggies/fruit/turmeric juice and cauliflower crackers, apple and almond butter  No results found for: VD25OH  No results found for: VITAMINB12   She is exercising on the elliptical 2-3 times weekly.  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  Her son has autism.  ROS: Constitutional: + See HPI Eyes: no blurry vision, no xerophthalmia ENT: no sore throat, + see HPI Cardiovascular: + CP (recently diagnosed with pleurisy)/no SOB/+ palpitations/no leg swelling Respiratory: no cough/SOB Gastrointestinal: no N/V/D/ + C Musculoskeletal: + Both  muscle/joint aches Skin:  + rash (eczema) Neurological: no tremors/numbness/tingling/dizziness, + headache Psychiatric: + Both depression/anxiety  Past Medical History:  Diagnosis Date  . Anxiety   . Breast discharge    bil discharge greater on left/ bloody  . Depression   . Medical history non-contributory   . Vaginal Pap smear, abnormal    Past Surgical History:  Procedure Laterality Date  . DILATION AND CURETTAGE OF UTERUS    . GANGLION CYST EXCISION Right 2011   foot  . LEEP    . TONSILLECTOMY    . [place] TO[place] EXTRACTION  1999   Social History   Socioeconomic History  . Marital status: Married    Spouse name: Not on file  . Number of children: 1  . Years of education: Not on file  . Highest education level: Not on file  Occupational History  . RN  Social Needs  . Financial resource strain: Not on file  . Food insecurity    Worry: Not on file    Inability: Not on file  . Transportation needs    Medical: Not on file    Non-medical: Not on file  Tobacco Use  . Smoking status: Former Research scientist (life sciences), quit in 2006  . Smokeless tobacco: Never Used  Substance and Sexual Activity  . Alcohol use: No    Alcohol/week: 1.0 standard drinks    Types: 1 Glasses of wine per week    Comment: socially  . Drug use: No  . Sexual activity: Yes    Birth control/protection: None  Lifestyle  . Physical activity    Days per week: 4 days    Minutes per session: 40 min  . Stress: Rather much   Current Outpatient Medications on File Prior to Visit  Medication Sig Dispense Refill  . amphetamine-dextroamphetamine (ADDERALL) 10 MG tablet Take 10 mg by mouth daily with breakfast.    . lisdexamfetamine (VYVANSE) 50 MG capsule Take 50 mg by mouth daily.    . Magnesium 250 MG TABS Take by mouth daily.    . Multiple Vitamins-Minerals (MULTIVITAMIN ADULT PO) Take by mouth daily.    . ondansetron (ZOFRAN ODT) 4 MG disintegrating tablet Take 1 tablet (4 mg total) by mouth every 8 (eight) hours  as needed for nausea. 10 tablet 0  . Probiotic Product (PROBIOTIC PO) Take by mouth daily.    . Riboflavin 400 MG TABS Take by mouth daily.    . sertraline (ZOLOFT) 50 MG tablet Take 150 mg by mouth daily.     . Wheat Dextrin (BENEFIBER PO) Take by mouth daily.     No current facility-administered medications on file prior to visit.    No Known Allergies Family History  Problem Relation Age of Onset  . Heart murmur Mother   . Diabetes Mother   . Hepatitis Mother        auto immune   . Breast cancer Paternal Grandmother   . Hypertension Paternal Grandmother   . Hyperlipidemia Paternal Grandmother   . Heart disease  Paternal Grandmother   . Hypertension Paternal Grandfather   . Heart disease Paternal Grandfather        CABG  . Heart attack Paternal Grandfather        X4  . Hyperlipidemia Paternal Grandfather   . Hypertension Father   . Hyperlipidemia Father   . Hashimoto's thyroiditis Sister   . Depression Sister   . Anxiety disorder Sister   . Scoliosis Brother   . Drug abuse Brother   . Autism Son     PE: BP 112/70   Pulse 82   Ht 5' 6.5" (1.689 m)   Wt 170 lb (77.1 kg)   LMP 05/28/2019   SpO2 98%   BMI 27.03 kg/m  Wt Readings from Last 3 Encounters:  06/27/19 170 lb (77.1 kg)  06/25/19 172 lb (78 kg)  06/04/19 168 lb (76.2 kg)   Constitutional: overweight, in NAD Eyes: PERRLA, EOMI, no exophthalmos ENT: moist mucous membranes, no thyromegaly, no cervical lymphadenopathy Cardiovascular: RRR, No MRG Respiratory: CTA B Gastrointestinal: abdomen soft, NT, ND, BS+ Musculoskeletal: no deformities, strength intact in all 4 Skin: moist, warm, no rashes Neurological: no tremor with outstretched hands, DTR normal in all 4  ASSESSMENT: 1.  Family history of thyroiditis  2. Fatigue  3.  Breast discharge  4.  Hair loss  5.  Hyperlipidemia  PLAN: 1. FH of Hashimoto's thyroiditis -Patient has family history of thyroiditis in her sister, for whom the thyroid  antibodies were negative but on ultrasound she had thyroid nodules and changes suggestive of thyroiditis.  She feels that she has any symptoms that align with this disease: Weight gain, fatigue, hair loss, headaches, high LDL - we had a long discussion about  Hashimoto thyroiditis. I explained that this is an autoimmune disorder, in which she develops antibodies against her own thyroid. The antibodies bind to the thyroid tissue and cause inflammation, and, eventually, destruction of the gland and hypothyroidism. We don't know how long this process can be, it can last from months to years. As of now, based on the last results that I have, her thyroid tests are normal. We will repeat them today, however. I will also add thyroid antibody levels. - We discussed about treatment for Hashimoto thyroiditis, which is actually limited to thyroid hormones in case her TFTs are abnormal. Supplements like selenium has been tried with various results, some showing improvement in the TPO antibodies. However, there are no randomized controlled trials of this are consistent results between trials. We also discussed about ways to improve her immune system (relaxation, diet, exercise, sleep) to reduce the Ab titer and, subsequently, the thyroid inflammation. - We decided to check thyroid tests now, but if negative, no further investigation is needed for now.  She can be followed by PCP with yearly TFTs. -Since she is complaining of occasional pressure in neck, I will also check a thyroid ultrasound  2.  Fatigue -I do not see evidence of endocrine causes for this: No signs of adrenal insufficiency, hypothyroidism, hypopituitarism -I reviewed the records of previous investigation by PCP including CBC, lipid panel, LFTs, and she is under investigation by rheumatology.  She also has a predominance of lymphocytes on CBC and we discussed about the possibility of a chronic viral infection. -We will recheck her TFTs at this visit  along with vitamins  3.  Breast discharge -Longstanding, bilateral, mild -She did not have this in the last week -Previous ultrasound normal, however, due to persistent discharge, she may need a  referral to surgery.  First, however, I would like to check a prolactin level.  Reportedly, this was checked in the past and it was normal.  4.  Hair loss -Chronic -We will check TFTs today but also add a B12 and D vitamin  5.  Hyperlipidemia -We reviewed together her lipid panel from 05/2019: LDL very high -She is on a vegan diet and has been on this for a long time -I suspect that her high LDL could be genetic.  She also has a history of fatty liver. -We decided to start a low dose of rosuvastatin.  If the above investigation is negative, she can be followed by PCP for this, but I would be happy to see her back if she decides to do this.  Patient Instructions  Please stop at the lab.  Start Rosuvastatin 5 mg daily.  Please come back for a follow-up appointment in 6 months.  Component     Latest Ref Rng & Units 06/27/2019  TSH     0.35 - 4.50 uIU/mL 1.30  T4,Free(Direct)     0.60 - 1.60 ng/dL 0.77  Triiodothyronine,Free,Serum     2.3 - 4.2 pg/mL 3.2  Thyroglobulin Ab     < or = 1 IU/mL <1  Thyroperoxidase Ab SerPl-aCnc     <9 IU/mL <1  VITD     30.00 - 100.00 ng/mL 22.64 (L)  Vitamin B12     211 - 911 pg/mL 472  Prolactin     ng/mL 7.5   Labs normal with the exception of a low vitamin D.  I would suggest to start 2000 units vitamin D daily. I will let the patient decide whether she wants to follow with me for Vitamin D insufficiency and her hyperlipidemia or to return to see her PCP.    Narrative & Impression    CLINICAL DATA:  Family history of hypothyroidism. Evaluate for thyroid nodules.  EXAM: THYROID ULTRASOUND  TECHNIQUE: Ultrasound examination of the thyroid gland and adjacent soft tissues was performed.  COMPARISON:  None.  FINDINGS: Parenchymal  Echotexture: Normal  Isthmus: 0.3 cm  Right lobe: 6.0 x 1.6 x 1.8 cm  Left lobe: 6.0 x 1.4 x 1.9 cm  _________________________________________________________  Estimated total number of nodules >/= 1 cm: 0  Number of spongiform nodules >/=  2 cm not described below (TR1): 0  Number of mixed cystic and solid nodules >/= 1.5 cm not described below (TR2): 0  _________________________________________________________  No discrete nodules are seen within the thyroid gland.  IMPRESSION: Normal thyroid ultrasound.  No discrete thyroid nodules.   Electronically Signed   By: Markus Daft M.D.   On: 07/12/2019 12:49     Philemon Kingdom, MD PhD [facility] Endocrinology

## 2019-06-27 NOTE — Patient Instructions (Addendum)
Please stop at the lab.  Start Rosuvastatin 5 mg daily.  Please come back for a follow-up appointment in 6 months.

## 2019-06-28 LAB — PROLACTIN: Prolactin: 7.5 ng/mL

## 2019-06-28 LAB — THYROID PEROXIDASE ANTIBODY: Thyroperoxidase Ab SerPl-aCnc: <1 IU/mL (ref <9)

## 2019-06-28 LAB — THYROGLOBULIN ANTIBODY: Thyroglobulin Ab: <1 IU/mL (ref <=1)

## 2019-06-28 NOTE — Progress Notes (Signed)
Office Visit Note  Patient: Sharon Hartman             Date of Birth: 06/26/1981           MRN: [id number]             PCP: Donald Prose, MD Referring: Donald Prose, MD Visit Date: 07/10/2019 Occupation: _0 @  Subjective:  Raynauds phenomenon.   History of Present Illness: Sharon Hartman is a 38 y.o. female with history of Raynauds and arthralgias.  She states she continues to have Raynaud's symptoms.  Her joints are not as painful as the last visit.  Patient still continues to have some discomfort in her joints.  She states recently she has noticed some tremors in her left wrist especially when she dorsiflexes her wrist.  She does not have very good grip strength in her left hand.  She continues to have lower back pain.  She states she goes to a chiropractor who is told her that she has degenerative disc disease of her lumbar spine.  She also has some thoracic discomfort due to underlying scoliosis.  I do not have those x-rays to review.  Activities of Daily Living:  Patient reports morning stiffness for 1 hour.   Patient Reports nocturnal pain.  Difficulty dressing/grooming: Denies Difficulty climbing stairs: Denies Difficulty getting out of chair: Denies Difficulty using hands for taps, buttons, cutlery, and/or writing: Denies  Review of Systems  Constitutional: Positive for fatigue. Negative for night sweats, weight gain and weight loss.  HENT: Positive for mouth dryness. Negative for mouth sores, trouble swallowing, trouble swallowing and nose dryness.   Eyes: Positive for dryness. Negative for pain, redness and visual disturbance.  Respiratory: Negative for cough, shortness of breath and difficulty breathing.   Cardiovascular: Negative for chest pain, palpitations, hypertension, irregular heartbeat and swelling in legs/feet.  Gastrointestinal: Positive for constipation. Negative for blood in stool and diarrhea.  Endocrine: Negative for increased urination.  Genitourinary:  Negative for vaginal dryness.  Musculoskeletal: Positive for arthralgias, joint pain and morning stiffness. Negative for joint swelling, myalgias, muscle weakness, muscle tenderness and myalgias.  Skin: Positive for color change. Negative for rash, hair loss, skin tightness, ulcers and sensitivity to sunlight.  Allergic/Immunologic: Negative for susceptible to infections.  Neurological: Negative for dizziness, memory loss, night sweats and weakness.  Hematological: Negative for swollen glands.  Psychiatric/Behavioral: Positive for depressed mood and sleep disturbance. The patient is nervous/anxious.     PMFS History:  Patient Active Problem List   Diagnosis Date Noted  . Postpartum care following vaginal delivery (9/9) 06/18/2014  . Post-dates pregnancy 06/17/2014  . Palpitations 11/22/2013  . Syncope 11/22/2013  . Orthostatic hypotension 11/22/2013  . ADHD (attention deficit hyperactivity disorder) 12/04/2011    Past Medical History:  Diagnosis Date  . Anxiety   . Breast discharge    bil discharge greater on left/ bloody  . Depression   . Medical history non-contributory   . Vaginal Pap smear, abnormal     Family History  Problem Relation Age of Onset  . Heart murmur Mother   . Diabetes Mother   . Hepatitis Mother        auto immune   . Breast cancer Paternal Grandmother   . Hypertension Paternal Grandmother   . Hyperlipidemia Paternal Grandmother   . Heart disease Paternal Grandmother   . Hypertension Paternal Grandfather   . Heart disease Paternal Grandfather        CABG  . Heart attack Paternal Grandfather  X4  . Hyperlipidemia Paternal Grandfather   . Hypertension Father   . Hyperlipidemia Father   . Hashimoto's thyroiditis Sister   . Depression Sister   . Breast cancer Sister   . Anxiety disorder Sister   . Scoliosis Brother   . Drug abuse Brother   . Autism Son    Past Surgical History:  Procedure Laterality Date  . DILATION AND CURETTAGE OF UTERUS     . GANGLION CYST EXCISION Right 2011   foot  . LEEP    . TONSILLECTOMY    . [place] EXTRACTION  1999   Social History   Social History Narrative  . Not on file   Immunization History  Administered Date(s) Administered  . PPD Test 02/13/2012  . Tdap 02/13/2012     Objective: Vital Signs: BP 109/77 (BP Location: Left Arm, Patient Position: Sitting, Cuff Size: Normal)   Pulse 100   Resp 13   Ht 5' 6" (1.676 m)   Wt 176 lb 12.8 oz (80.2 kg)   BMI 28.54 kg/m    Physical Exam Vitals signs and nursing note reviewed.  Constitutional:      Appearance: She is well-developed.  HENT:     Head: Normocephalic and atraumatic.  Eyes:     Conjunctiva/sclera: Conjunctivae normal.  Neck:     Musculoskeletal: Normal range of motion.  Cardiovascular:     Rate and Rhythm: Normal rate and regular rhythm.     Heart sounds: Normal heart sounds.  Pulmonary:     Effort: Pulmonary effort is normal.     Breath sounds: Normal breath sounds.  Abdominal:     General: Bowel sounds are normal.     Palpations: Abdomen is soft.  Lymphadenopathy:     Cervical: No cervical adenopathy.  Skin:    General: Skin is warm and dry.     Capillary Refill: Capillary refill takes less than 2 seconds.  Neurological:     Mental Status: She is alert and oriented to person, place, and time.  Psychiatric:        Behavior: Behavior normal.      Musculoskeletal Exam: She has good range of motion of her cervical thoracic and lumbar spine.  Shoulder joints, elbow joints, wrist joints, MCPs PIPs and DIPs with good range of motion with no synovitis.  Hip joints, knee joints, ankles, MTPs PIPs and DIPs with good range of motion with no synovitis.  CDAI Exam: CDAI Score: - Patient Global: -; Provider Global: - Swollen: -; Tender: - Joint Exam   No joint exam has been documented for this visit   There is currently no information documented on the homunculus. Go to the Rheumatology activity and complete  the homunculus joint exam.  Investigation: No additional findings.  Imaging: No results found.  Recent Labs: Lab Results  Component Value Date   WBC 7.4 06/04/2019   HGB 13.6 06/04/2019   PLT 316 06/04/2019   NA 141 06/04/2019   K 3.7 06/04/2019   CL 109 06/04/2019   CO2 22 06/04/2019   GLUCOSE 99 06/04/2019   BUN 8 06/04/2019   CREATININE 0.61 06/04/2019   BILITOT 0.6 06/04/2019   ALKPHOS 40 06/04/2019   AST 17 06/04/2019   ALT 18 06/04/2019   PROT 7.6 06/04/2019   ALBUMIN 4.4 06/04/2019   CALCIUM 9.7 06/04/2019   GFRAA >60 06/04/2019  June 25, 2019 UA negative, anti-CCP negative, RF negative, ANA negative, ENA negative, C3-C4 normal, beta-2 GP were negative, anticardiolipin antibody negative,  ANCA negative, ACE 13, ESR 6, hepatitis B-, hepatitis C negative, G6PD normal (lupus anticoagulant -negative, cryoglobulins-negative  June 27, 2019 vitamin D 22.64  Speciality Comments: No specialty comments available.  Procedures:  No procedures performed Allergies: Patient has no known allergies.   Assessment / Plan:     Visit Diagnoses: Raynaud's disease without gangrene - History of severe Raynauds.  All autoimmune work-up is negative.  Patient had good capillary refill on examination today.  She states her symptoms get worse during the wintertime.  She is also concerned about severe nipple pain due to Raynolds.  I discussed the option of Procardia or nitroglycerin ointment.  I am hesitant as her blood pressure is low.  She states she has taken Procardia in the past without any problems.  She may benefit from Procardia during the winter months.  I will call in a prescription for Procardia 5 mg p.o. nightly.  Have advised her to try half a pill if her blood pressure is stable she can take the whole pill.  I also discussed the use of nitroglycerin ointment in case she gets develops any digital ulcers.  I will see her back in 3 months to evaluate her situation when the  temperatures are colder outside.  Polyarthralgia - She complains of migratory polyarthralgia.  No synovitis was noted.  All autoimmune work-up is negative.  Sicca syndrome, unspecified ([place]) - History of sicca symptoms for many years.  Over-the-counter products were discussed.  All autoimmune work-up is negative.  Other fatigue-she may have a component of myofascial pain syndrome.  She is already taking medications.  Need for regular exercise was discussed.  Vitamin D deficiency-she is on vitamin D supplement currently.  This may be contributing to her fatigue as well.  Other idiopathic scoliosis, thoracic region-patient is followed by a chiropractor.  DDD (degenerative disc disease), lumbar-according to patient she has degenerative disc disease.  I do not have the x-rays available.  She states she has been seeing a Restaurant manager, fast food for it.  Occasional tremors-she is concerned about tremors in her left hand especially with dorsiflexion.  She wants to have neurological evaluation.  I will refer her to neurology.  Orthostatic hypotension-I have emphasized that there is a high risk of dropping blood pressure with amlodipine.  She will have to be very careful about taking amlodipine.  And monitor her blood pressure closely if she has to drive.  Palpitations  Anxiety and depression-she is on medications.  Fatty liver  History of ADHD  Orders: Orders Placed This Encounter  Procedures  . Ambulatory referral to Neurology   Meds ordered this encounter  Medications  . amLODipine (NORVASC) 5 MG tablet    Sig: Take 1 tablet (5 mg total) by mouth at bedtime.    Dispense:  90 tablet    Refill:  0      Follow-Up Instructions: Return in about 3 months (around 10/09/2019) for Raynauds.   Bo Merino, MD  Note - This record has been created using Editor, commissioning.  Chart creation errors have been sought, but may not always  have been located. Such creation errors do not reflect on  the  standard of medical care.

## 2019-07-01 LAB — URINALYSIS, ROUTINE W REFLEX MICROSCOPIC
Bilirubin Urine: NEGATIVE
Glucose, UA: NEGATIVE
Hgb urine dipstick: NEGATIVE
Ketones, ur: NEGATIVE
Leukocytes,Ua: NEGATIVE
Nitrite: NEGATIVE
Protein, ur: NEGATIVE
Specific Gravity, Urine: 1.019 (ref 1.001–1.03)
pH: 5.5 (ref 5.0–8.0)

## 2019-07-01 LAB — CYCLIC CITRUL PEPTIDE ANTIBODY, IGG: Cyclic Citrullin Peptide Ab: <16 UNITS

## 2019-07-01 LAB — CRYOGLOBULIN: Cryoglobulin, Qualitative Analysis: NOT DETECTED

## 2019-07-01 LAB — ANTI-SMITH ANTIBODY: ENA SM Ab Ser-aCnc: <1 AI

## 2019-07-01 LAB — HEPATITIS B CORE ANTIBODY, IGM: Hep B C IgM: NONREACTIVE

## 2019-07-01 LAB — PAN-ANCA
ANCA Screen: NEGATIVE
Myeloperoxidase Abs: <1 AI
Serine Protease 3: <1 AI

## 2019-07-01 LAB — ANTI-SCLERODERMA ANTIBODY: Scleroderma (Scl-70) (ENA) Antibody, IgG: <1 AI

## 2019-07-01 LAB — CARDIOLIPIN ANTIBODIES, IGG, IGM, IGA
Anticardiolipin IgA: <11 [APL'U]
Anticardiolipin IgG: <14 [GPL'U]
Anticardiolipin IgM: <12 [MPL'U]

## 2019-07-01 LAB — CK: Total CK: 58 U/L (ref 29–143)

## 2019-07-01 LAB — BETA-2 GLYCOPROTEIN ANTIBODIES
Beta-2 Glyco 1 IgA: <9 SAU (ref <=20)
Beta-2 Glyco 1 IgM: 9 SMU (ref <=20)
Beta-2 Glyco I IgG: <9 SGU (ref <=20)

## 2019-07-01 LAB — LUPUS ANTICOAGULANT EVAL W/ REFLEX
PTT-LA Screen: 36 s (ref <=40)
dRVVT: 49 s — ABNORMAL HIGH (ref <=45)

## 2019-07-01 LAB — RFX DRVVT 1:1 MIX

## 2019-07-01 LAB — RHEUMATOID FACTOR: Rheumatoid fact SerPl-aCnc: <14 IU/mL (ref <14)

## 2019-07-01 LAB — ANTI-DNA ANTIBODY, DOUBLE-STRANDED: ds DNA Ab: <1 IU/mL

## 2019-07-01 LAB — HEPATITIS C ANTIBODY
Hepatitis C Ab: NONREACTIVE
SIGNAL TO CUT-OFF: 0.01 (ref <1.00)

## 2019-07-01 LAB — RNP ANTIBODY: Ribonucleic Protein(ENA) Antibody, IgG: <1 AI

## 2019-07-01 LAB — ANA: Anti Nuclear Antibody (ANA): NEGATIVE

## 2019-07-01 LAB — RFLX DRVVT CONFRIM: DRVVT CONFIRM: POSITIVE — AB

## 2019-07-01 LAB — C3 AND C4
C3 Complement: 124 mg/dL (ref 83–193)
C4 Complement: 32 mg/dL (ref 15–57)

## 2019-07-01 LAB — SEDIMENTATION RATE: Sed Rate: 6 mm/h (ref 0–20)

## 2019-07-01 LAB — ANGIOTENSIN CONVERTING ENZYME: Angiotensin-Converting Enzyme: 13 U/L (ref 9–67)

## 2019-07-01 LAB — GLUCOSE 6 PHOSPHATE DEHYDROGENASE: G-6PDH: 12.8 U/g Hgb (ref 7.0–20.5)

## 2019-07-01 LAB — SJOGRENS SYNDROME-B EXTRACTABLE NUCLEAR ANTIBODY: SSB (La) (ENA) Antibody, IgG: <1 AI

## 2019-07-01 LAB — SJOGRENS SYNDROME-A EXTRACTABLE NUCLEAR ANTIBODY: SSA (Ro) (ENA) Antibody, IgG: <1 AI

## 2019-07-01 LAB — HEPATITIS B SURFACE ANTIGEN: Hepatitis B Surface Ag: NONREACTIVE

## 2019-07-02 ENCOUNTER — Ambulatory Visit: Payer: No Typology Code available for payment source | Admitting: Internal Medicine

## 2019-07-04 ENCOUNTER — Ambulatory Visit: Payer: No Typology Code available for payment source | Admitting: Rheumatology

## 2019-07-10 ENCOUNTER — Other Ambulatory Visit: Payer: Self-pay

## 2019-07-10 ENCOUNTER — Encounter: Payer: Self-pay | Admitting: Neurology

## 2019-07-10 ENCOUNTER — Encounter: Payer: Self-pay | Admitting: Rheumatology

## 2019-07-10 ENCOUNTER — Ambulatory Visit: Payer: Commercial Managed Care - PPO | Admitting: Rheumatology

## 2019-07-10 VITALS — BP 109/77 | HR 100 | Resp 13 | Ht 66.0 in | Wt 176.8 lb

## 2019-07-10 DIAGNOSIS — M51369 Other intervertebral disc degeneration, lumbar region without mention of lumbar back pain or lower extremity pain: Secondary | ICD-10-CM

## 2019-07-10 DIAGNOSIS — R5383 Other fatigue: Secondary | ICD-10-CM | POA: Diagnosis not present

## 2019-07-10 DIAGNOSIS — M2550 Pain in unspecified joint: Secondary | ICD-10-CM

## 2019-07-10 DIAGNOSIS — K760 Fatty (change of) liver, not elsewhere classified: Secondary | ICD-10-CM

## 2019-07-10 DIAGNOSIS — M5136 Other intervertebral disc degeneration, lumbar region: Secondary | ICD-10-CM

## 2019-07-10 DIAGNOSIS — I951 Orthostatic hypotension: Secondary | ICD-10-CM

## 2019-07-10 DIAGNOSIS — R251 Tremor, unspecified: Secondary | ICD-10-CM

## 2019-07-10 DIAGNOSIS — I7300 Raynaud's syndrome without gangrene: Secondary | ICD-10-CM

## 2019-07-10 DIAGNOSIS — R002 Palpitations: Secondary | ICD-10-CM

## 2019-07-10 DIAGNOSIS — Z8659 Personal history of other mental and behavioral disorders: Secondary | ICD-10-CM

## 2019-07-10 DIAGNOSIS — F419 Anxiety disorder, unspecified: Secondary | ICD-10-CM

## 2019-07-10 DIAGNOSIS — F329 Major depressive disorder, single episode, unspecified: Secondary | ICD-10-CM

## 2019-07-10 DIAGNOSIS — E559 Vitamin D deficiency, unspecified: Secondary | ICD-10-CM

## 2019-07-10 DIAGNOSIS — M3500 Sicca syndrome, unspecified: Secondary | ICD-10-CM

## 2019-07-10 DIAGNOSIS — M4124 Other idiopathic scoliosis, thoracic region: Secondary | ICD-10-CM

## 2019-07-10 DIAGNOSIS — F32A Depression, unspecified: Secondary | ICD-10-CM

## 2019-07-10 MED ORDER — AMLODIPINE BESYLATE 5 MG PO TABS: 5.0000 mg | ORAL_TABLET | Freq: Every day | ORAL | 0 refills | Status: DC

## 2019-07-11 ENCOUNTER — Ambulatory Visit
Admission: RE | Admit: 2019-07-11 | Discharge: 2019-07-11 | Disposition: A | Discharge status: Home / Self Care | Payer: Commercial Managed Care - PPO | Source: Ambulatory Visit | Attending: Internal Medicine | Admitting: Internal Medicine

## 2019-07-11 DIAGNOSIS — Z8349 Family history of other endocrine, nutritional and metabolic diseases: Secondary | ICD-10-CM

## 2019-07-23 ENCOUNTER — Ambulatory Visit: Payer: No Typology Code available for payment source | Admitting: Rheumatology

## 2019-08-08 ENCOUNTER — Other Ambulatory Visit: Payer: Self-pay | Admitting: Obstetrics and Gynecology

## 2019-08-13 NOTE — Progress Notes (Signed)
Subjective:   Sharon Hartman was seen in consultation in the movement disorder clinic at the request of Bo Merino, MD.  The evaluation is for tremor.  Prior medical records are reviewed.  This is a 38 year old female with a history of Raynaud's syndrome who has noticed tremor in her left wrist when she dorsiflexes the wrist.  Tremor started approximately 6-7 months ago and it will "happen randomly."  States that when she dorsiflexes, "its like my hand is having a seizure but the rest of my body is fine."  She brings a video in today.  It may happen a few times a week and may happen a few times a month.  Its only with wrist dorsiflexion.  She has noted decreased grasp on the L hand but that is intermittent.  She is R hand dominant.  She has raynauds so notes that she has paresthesias anyway but L seems more numb anyway.  The L leg is normal.  The bladder and bowel function well except for constipation.  She is on linzess.  She has "random" balance issues but "it flares with stress."  It has been that way "all along."   Vision is good unless has migraine with aura.  She is on topamax for weight loss and it helped migraine prevention.    No hx of neuroimaging in the recent years.  She also c/o smell of burnt plastic.  It is independent of migraine.  She states that it will last up to an 1 hour and it has happened about 10 times in the last year.  Admits that she worries about MS.  Current/Previously tried tremor medications:   Current medications that may exacerbate tremor:  Adderall; Vyvanse  Outside reports reviewed: historical medical records, lab reports and referral letter/letters.  No Known Allergies  Current Outpatient Medications  Medication Instructions  . amLODipine (NORVASC) 5 mg, Oral, Daily at bedtime  . amphetamine-dextroamphetamine (ADDERALL) 10 MG tablet 10 mg, Oral, Daily with breakfast  . aspirin EC 81 mg, Oral, Daily  . lisdexamfetamine (VYVANSE) 50 mg, Oral, Daily  .  Magnesium 250 MG TABS Oral, Daily  . Multiple Vitamins-Minerals (MULTIVITAMIN ADULT PO) Oral, Daily  . ondansetron (ZOFRAN ODT) 4 mg, Oral, Every 8 hours PRN  . Probiotic Product (PROBIOTIC PO) Oral, Daily  . Riboflavin 400 MG TABS Oral, Daily  . rosuvastatin (CRESTOR) 5 mg, Oral, Daily  . sertraline (ZOLOFT) 150 mg, Oral, Daily  . topiramate (TOPAMAX) 25 mg, Oral, Daily at bedtime  . Wheat Dextrin (BENEFIBER PO) Oral, Daily    Past Medical History:  Diagnosis Date  . Anxiety   . Breast discharge    bil discharge greater on left/ bloody  . Depression   . Medical history non-contributory   . Vaginal Pap smear, abnormal     Past Surgical History:  Procedure Laterality Date  . DILATION AND CURETTAGE OF UTERUS    . GANGLION CYST EXCISION Right 2011   foot  . LEEP    . TONSILLECTOMY    . WISDOM TOOTH EXTRACTION  1999    Social History   Socioeconomic History  . Marital status: Married    Spouse name: Not on file  . Number of children: Not on file  . Years of education: Not on file  . Highest education level: Not on file  Occupational History  . Not on file  Social Needs  . Financial resource strain: Not on file  . Food insecurity    Worry: Not  on file    Inability: Not on file  . Transportation needs    Medical: Not on file    Non-medical: Not on file  Tobacco Use  . Smoking status: Former Research scientist (life sciences)  . Smokeless tobacco: Never Used  Substance and Sexual Activity  . Alcohol use: No    Alcohol/week: 1.0 standard drinks    Types: 1 Glasses of wine per week    Comment: socially  . Drug use: No  . Sexual activity: Yes    Birth control/protection: None  Lifestyle  . Physical activity    Days per week: 4 days    Minutes per session: 40 min  . Stress: Rather much  Relationships  . Social connections    Talks on phone: More than three times a week    Gets together: Once a week    Attends religious service: Never    Active member of club or organization: No     Attends meetings of clubs or organizations: Never    Relationship status: Living with partner  . Intimate partner violence    Fear of current or ex partner: No    Emotionally abused: No    Physically abused: No    Forced sexual activity: No  Other Topics Concern  . Not on file  Social History Narrative  . Not on file    Family Status  Relation Name Status  . Mother  Deceased at age 19  . MGM  Deceased at age 83  . MGF  Deceased at age 39  . PGM  Deceased at age 58  . PGF  Deceased at age 96  . Father  Alive  . Sister  Alive  . Brother  Alive  . Son  Alive    Review of Systems Review of Systems  Constitutional: Negative.   HENT: Negative.   Eyes: Negative.   Cardiovascular: Negative.   Gastrointestinal: Negative.   Genitourinary: Negative.   Musculoskeletal: Positive for back pain.  Skin: Negative.   Endo/Heme/Allergies: Negative.      Objective:   VITALS:   Vitals:   08/15/19 0838  BP: 105/68  Pulse: 86  Resp: 14  Temp: (!) 97.3 F (36.3 C)  SpO2: 99%  Weight: 173 lb 3.2 oz (78.6 kg)  Height: 5\' 6"  (1.676 m)   Gen:  Appears stated age and in NAD. HEENT:  Normocephalic, atraumatic. The mucous membranes are moist. The superficial temporal arteries are without ropiness or tenderness. Cardiovascular: Regular rate and rhythm. Lungs: Clear to auscultation bilaterally. Neck: There are no carotid bruits noted bilaterally.  NEUROLOGICAL:  Orientation:  The patient is alert and oriented x 3.  Recent and remote memory are intact.  Attention span and concentration are normal.  Able to name objects and repeat without trouble.  Fund of knowledge is appropriate Cranial nerves: There is good facial symmetry.  Extraocular muscles are intact and visual fields are full to confrontational testing. Speech is fluent and clear. Soft palate rises symmetrically and there is no tongue deviation. Hearing is intact to conversational tone. Tone: Tone is good throughout. Sensation:  Sensation is intact to light touch and pinprick throughout (facial, trunk, extremities). Vibration is intact at the bilateral big toe. There is no extinction with double simultaneous stimulation. There is no sensory dermatomal level identified. Coordination:  The patient has no dysdiadichokinesia or dysmetria. Motor: Strength is 5/5 in the bilateral upper and lower extremities.  Shoulder shrug is equal bilaterally.  There is no pronator drift.  There  are no fasciculations noted. DTR's: Deep tendon reflexes are 2+/4 at the bilateral biceps, triceps, brachioradialis, 3-at the bilateral patella and achilles.  Plantar responses are downgoing bilaterally. Gait and Station: The patient is able to ambulate without difficulty. The patient is able to heel toe walk without any difficulty. The patient is able to ambulate in a tandem fashion. The patient is able to stand in the Romberg position.  Abnormal movements: None.  No asterixis.  No myoclonus.  No clonus.  MOVEMENT EXAM: Tremor:  There is no rest tremor.  There is no postural tremor.  There is no intention tremor.  She has no trouble with Archimedes spirals with right or left hand.  Review of video: Patient does bring in a video with her.  Her left wrist is held in a dorsiflexed position and she has clonic-like movements of the left wrist.  Lab Results  Component Value Date   TSH 1.30 06/27/2019     Chemistry      Component Value Date/Time   NA 141 06/04/2019 2331   K 3.7 06/04/2019 2331   CL 109 06/04/2019 2331   CO2 22 06/04/2019 2331   BUN 8 06/04/2019 2331   CREATININE 0.61 06/04/2019 2331      Component Value Date/Time   CALCIUM 9.7 06/04/2019 2331   ALKPHOS 40 06/04/2019 2331   AST 17 06/04/2019 2331   ALT 18 06/04/2019 2331   BILITOT 0.6 06/04/2019 2331     Lab Results  Component Value Date   VITAMINB12 472 06/27/2019   No results found for: HGBA1C      Assessment/Plan:   1.  Possible clonus of the left wrist  -looked  more like clonus in her video than tremor.  She does have a history of fatty liver disease, but no elevated liver enzymes and the video really did not look like asterixis.  I will go ahead and check an MRI of the brain and cervical spine, just to make sure we are not dealing with syrinx or demyelinating disease.  I was not able to elicit the movements today, and it would be somewhat unusual for the symptoms to come and go if she had a fixed structural lesion.  -Patient does report that she is having intermittent problems with grasping objects.  We will proceed with an EMG.  I did not notice any decreased grasp on exam today.  2.  Intermittent olfactory hallucination [smells burning plastic)  -Told the patient that if we did not see anything on the above testing, then we will consider EEG.  My suspicion for anything abnormal here is actually quite low.  She does not associate the abnormal smells with her migraines.  Those have actually been very well controlled lately.  3.  Hx Raynauds  -following with rheum  4.  Follow-up will depend on the results of the above testing.  CC:  Donald Prose, MD

## 2019-08-15 ENCOUNTER — Other Ambulatory Visit: Payer: Self-pay

## 2019-08-15 ENCOUNTER — Encounter: Payer: Self-pay | Admitting: Neurology

## 2019-08-15 ENCOUNTER — Ambulatory Visit: Payer: Commercial Managed Care - PPO | Admitting: Neurology

## 2019-08-15 VITALS — BP 105/68 | HR 86 | Temp 97.3°F | Resp 14 | Ht 66.0 in | Wt 173.2 lb

## 2019-08-15 DIAGNOSIS — R258 Other abnormal involuntary movements: Secondary | ICD-10-CM

## 2019-08-15 DIAGNOSIS — R442 Other hallucinations: Secondary | ICD-10-CM

## 2019-08-15 DIAGNOSIS — R202 Paresthesia of skin: Secondary | ICD-10-CM

## 2019-08-15 DIAGNOSIS — I7300 Raynaud's syndrome without gangrene: Secondary | ICD-10-CM

## 2019-08-15 DIAGNOSIS — M542 Cervicalgia: Secondary | ICD-10-CM

## 2019-08-15 NOTE — Patient Instructions (Addendum)
We have sent a referral to [facility] for your MRI and they will call you directly to schedule your appointment. They are located at [place]. If you need to contact them directly please call [phone].   ELECTROMYOGRAM AND NERVE CONDUCTION STUDIES (EMG/NCS) INSTRUCTIONS  How to Prepare The neurologist conducting the EMG will need to know if you have certain medical conditions. Tell the neurologist and other EMG lab personnel if you: . Have a pacemaker or any other electrical medical device . Take blood-thinning medications . Have hemophilia, a blood-clotting disorder that causes prolonged bleeding Bathing Take a shower or bath shortly before your exam in order to remove oils from your skin. Don't apply lotions or creams before the exam.  What to Expect You'll likely be asked to change into a hospital gown for the procedure and lie down on an examination table. The following explanations can help you understand what will happen during the exam.  . Electrodes. The neurologist or a technician places surface electrodes at various locations on your skin depending on where you're experiencing symptoms. Or the neurologist may insert needle electrodes at different sites depending on your symptoms.  . Sensations. The electrodes will at times transmit a tiny electrical current that you may feel as a twinge or spasm. The needle electrode may cause discomfort or pain that usually ends shortly after the needle is removed. If you are concerned about discomfort or pain, you may want to talk to the neurologist about taking a short break during the exam.  . Instructions. During the needle EMG, the neurologist will assess whether there is any spontaneous electrical activity when the muscle is at rest - activity that isn't present in healthy muscle tissue - and the degree of activity when you slightly contract the muscle.  He or she will give you instructions on resting and contracting a  muscle at appropriate times. Depending on what muscles and nerves the neurologist is examining, he or she may ask you to change positions during the exam.  After your EMG You may experience some temporary, minor bruising where the needle electrode was inserted into your muscle. This bruising should fade within several days. If it persists, contact your primary care doctor.

## 2019-08-18 ENCOUNTER — Emergency Department (HOSPITAL_COMMUNITY)
Admission: EM | Admit: 2019-08-18 | Discharge: 2019-08-18 | Disposition: A | Discharge status: Home / Self Care | Payer: Commercial Managed Care - PPO | Attending: Emergency Medicine | Admitting: Emergency Medicine

## 2019-08-18 ENCOUNTER — Emergency Department (HOSPITAL_COMMUNITY): Payer: Commercial Managed Care - PPO

## 2019-08-18 ENCOUNTER — Encounter (HOSPITAL_COMMUNITY): Payer: Self-pay

## 2019-08-18 ENCOUNTER — Other Ambulatory Visit: Payer: Self-pay

## 2019-08-18 DIAGNOSIS — Z7982 Long term (current) use of aspirin: Secondary | ICD-10-CM | POA: Insufficient documentation

## 2019-08-18 DIAGNOSIS — Z87891 Personal history of nicotine dependence: Secondary | ICD-10-CM | POA: Insufficient documentation

## 2019-08-18 DIAGNOSIS — R531 Weakness: Secondary | ICD-10-CM | POA: Diagnosis not present

## 2019-08-18 DIAGNOSIS — R202 Paresthesia of skin: Secondary | ICD-10-CM

## 2019-08-18 DIAGNOSIS — R296 Repeated falls: Secondary | ICD-10-CM | POA: Insufficient documentation

## 2019-08-18 DIAGNOSIS — W19XXXA Unspecified fall, initial encounter: Secondary | ICD-10-CM

## 2019-08-18 DIAGNOSIS — F909 Attention-deficit hyperactivity disorder, unspecified type: Secondary | ICD-10-CM | POA: Diagnosis not present

## 2019-08-18 DIAGNOSIS — Z79899 Other long term (current) drug therapy: Secondary | ICD-10-CM | POA: Insufficient documentation

## 2019-08-18 DIAGNOSIS — R251 Tremor, unspecified: Secondary | ICD-10-CM | POA: Diagnosis not present

## 2019-08-18 DIAGNOSIS — R200 Anesthesia of skin: Secondary | ICD-10-CM | POA: Diagnosis present

## 2019-08-18 LAB — BASIC METABOLIC PANEL
Anion gap: 11 (ref 5–15)
BUN: 11 mg/dL (ref 6–20)
CO2: 20 mmol/L — ABNORMAL LOW (ref 22–32)
Calcium: 9.3 mg/dL (ref 8.9–10.3)
Chloride: 114 mmol/L — ABNORMAL HIGH (ref 98–111)
Creatinine, Ser: 0.57 mg/dL (ref 0.44–1.00)
GFR calc Af Amer: >60 mL/min (ref >60)
GFR calc non Af Amer: >60 mL/min (ref >60)
Glucose, Bld: 118 mg/dL — ABNORMAL HIGH (ref 70–99)
Potassium: 3.6 mmol/L (ref 3.5–5.1)
Sodium: 145 mmol/L (ref 135–145)

## 2019-08-18 LAB — CBC WITH DIFFERENTIAL/PLATELET
Abs Immature Granulocytes: 0.04 10*3/uL (ref 0.00–0.07)
Basophils Absolute: 0 10*3/uL (ref 0.0–0.1)
Basophils Relative: 1 %
Eosinophils Absolute: 0.1 10*3/uL (ref 0.0–0.5)
Eosinophils Relative: 2 %
HCT: 39 % (ref 36.0–46.0)
Hemoglobin: 12.9 g/dL (ref 12.0–15.0)
Immature Granulocytes: 1 %
Lymphocytes Relative: 56 %
Lymphs Abs: 3.7 10*3/uL (ref 0.7–4.0)
MCH: 30.2 pg (ref 26.0–34.0)
MCHC: 33.1 g/dL (ref 30.0–36.0)
MCV: 91.3 fL (ref 80.0–100.0)
Monocytes Absolute: 0.3 10*3/uL (ref 0.1–1.0)
Monocytes Relative: 4 %
Neutro Abs: 2.4 10*3/uL (ref 1.7–7.7)
Neutrophils Relative %: 36 %
Platelets: 344 10*3/uL (ref 150–400)
RBC: 4.27 MIL/uL (ref 3.87–5.11)
RDW: 12.4 % (ref 11.5–15.5)
WBC: 6.6 10*3/uL (ref 4.0–10.5)
nRBC: 0 % (ref 0.0–0.2)

## 2019-08-18 LAB — I-STAT BETA HCG BLOOD, ED (MC, WL, AP ONLY): I-stat hCG, quantitative: <5 m[IU]/mL (ref <5)

## 2019-08-18 MED ORDER — KETOROLAC TROMETHAMINE 30 MG/ML IJ SOLN
30.0000 mg | Freq: Once | INTRAMUSCULAR | Status: AC
  Administered 2019-08-18: 30 mg via INTRAVENOUS
  Filled 2019-08-18: qty 1

## 2019-08-18 MED ORDER — SODIUM CHLORIDE 0.9 % IV BOLUS (SEPSIS)
1000.0000 mL | Freq: Once | INTRAVENOUS | Status: AC
  Administered 2019-08-18: 1000 mL via INTRAVENOUS

## 2019-08-18 MED ORDER — DIPHENHYDRAMINE HCL 50 MG/ML IJ SOLN
25.0000 mg | Freq: Once | INTRAMUSCULAR | Status: AC
  Administered 2019-08-18: 25 mg via INTRAVENOUS
  Filled 2019-08-18: qty 1

## 2019-08-18 MED ORDER — METOCLOPRAMIDE HCL 5 MG/ML IJ SOLN
10.0000 mg | Freq: Once | INTRAMUSCULAR | Status: AC
  Administered 2019-08-18: 10 mg via INTRAVENOUS
  Filled 2019-08-18: qty 2

## 2019-08-18 NOTE — ED Notes (Signed)
Patient transported to MRI 

## 2019-08-18 NOTE — ED Triage Notes (Addendum)
Pt reports that over the last week she has been frequently falling. She states that tonight when she went to be her L eye felt like it was twitching uncontrollably. Also reports that she was unable to feel her husband touching her R heel. Pt is emotional in triage.

## 2019-08-18 NOTE — ED Provider Notes (Signed)
[facility] DEPT Provider Note   CSN: [id number] Arrival date & time: 08/18/19  0030     History   Chief Complaint Chief Complaint  Patient presents with  . Fall  . Numbness    HPI Sharon Hartman is a 38 y.o. female.     The history is provided by the patient.  Neurologic Problem This is a recurrent problem. The problem has been gradually improving. Pertinent negatives include no chest pain, no headaches and no shortness of breath. The symptoms are aggravated by walking. The symptoms are relieved by rest.   Patient history of ADD, anxiety, hyperlipidemia, Raynaud's disease presents with numbness and falls  Patient reports a long history of numbness and weakness in different parts of her body.  She also has a history of tremors in her extremities.  She does get headaches frequently, but none at this time.  She reports blurred vision at times.  Earlier in the day she was with her son when she had weakness and numbness in her right leg.  She has had similar episodes before in her left leg.  She also reports some tremoring in her right leg.  She also mentions twitching in her left eye. Due to weakness in her legs she did fall up to 2 times in the day.  Her husband became very concerned and wanted her to be evaluated in the ER  She does admit to 2 glasses of champagne to celebrate the presidential election, but does not feel intoxicated.  She has been evaluated by neurology recently and is planning to have MRI brain and C-spine as an outpatient  No new medicines.  No traumatic injuries from fall Past Medical History:  Diagnosis Date  . ADD (attention deficit disorder)   . Anxiety   . Breast discharge    bil discharge greater on left/ bloody  . Depression   . Hyperlipidemia   . Medical history non-contributory   . Migraine   . Raynaud's disease   . Vaginal Pap smear, abnormal     Patient Active Problem List   Diagnosis Date Noted  .  Postpartum care following vaginal delivery (9/9) 06/18/2014  . Post-dates pregnancy 06/17/2014  . Palpitations 11/22/2013  . Syncope 11/22/2013  . Orthostatic hypotension 11/22/2013  . ADHD (attention deficit hyperactivity disorder) 12/04/2011    Past Surgical History:  Procedure Laterality Date  . DILATION AND CURETTAGE OF UTERUS    . GANGLION CYST EXCISION Right 2011   foot  . LEEP    . TONSILLECTOMY    . WISDOM TOOTH EXTRACTION  1999     OB History    Gravida  2   Para  1   Term  1   Preterm      AB  1   Living  1     SAB      TAB  1   Ectopic      Multiple      Live Births  1            Home Medications    Prior to Admission medications   Medication Sig Start Date End Date Taking? Authorizing Provider  amLODipine (NORVASC) 5 MG tablet Take 1 tablet (5 mg total) by mouth at bedtime. 07/10/19   Bo Merino, MD  amphetamine-dextroamphetamine (ADDERALL) 10 MG tablet Take 10 mg by mouth daily with breakfast.    [provider]  aspirin EC 81 MG tablet Take 81 mg by mouth daily.  [provider]  lisdexamfetamine (VYVANSE) 50 MG capsule Take 50 mg by mouth daily.    [provider]  Magnesium 250 MG TABS Take by mouth daily.    [provider]  Multiple Vitamins-Minerals (MULTIVITAMIN ADULT PO) Take by mouth daily.    [provider]  ondansetron (ZOFRAN ODT) 4 MG disintegrating tablet Take 1 tablet (4 mg total) by mouth every 8 (eight) hours as needed for nausea. 06/05/19   Larene Pickett, PA-C  Probiotic Product (PROBIOTIC PO) Take by mouth daily.    [provider]  Riboflavin 400 MG TABS Take by mouth daily.    [provider]  rosuvastatin (CRESTOR) 5 MG tablet Take 1 tablet (5 mg total) by mouth daily. 06/27/19   Philemon Kingdom, MD  sertraline (ZOLOFT) 50 MG tablet Take 150 mg by mouth daily.     [provider]  topiramate (TOPAMAX) 25 MG capsule Take 25 mg by mouth at  bedtime.    [provider]  Wheat Dextrin (BENEFIBER PO) Take by mouth daily.    [provider]    Family History Family History  Problem Relation Age of Onset  . Heart murmur Mother   . Diabetes Mother   . Hepatitis Mother        auto immune   . Breast cancer Paternal Grandmother   . Hypertension Paternal Grandmother   . Hyperlipidemia Paternal Grandmother   . Heart disease Paternal Grandmother   . Hypertension Paternal Grandfather   . Heart disease Paternal Grandfather        CABG  . Heart attack Paternal Grandfather        X4  . Hyperlipidemia Paternal Grandfather   . Hypertension Father   . Hyperlipidemia Father   . Hashimoto's thyroiditis Sister   . Depression Sister   . Breast cancer Sister   . Anxiety disorder Sister   . Scoliosis Brother   . Drug abuse Brother   . Autism Son     Social History Social History   Tobacco Use  . Smoking status: Former Research scientist (life sciences)  . Smokeless tobacco: Never Used  Substance Use Topics  . Alcohol use: No    Frequency: Never  . Drug use: No     Allergies   Patient has no known allergies.   Review of Systems Review of Systems  Constitutional: Negative for fever.  Eyes: Positive for visual disturbance.  Respiratory: Negative for shortness of breath.   Cardiovascular: Negative for chest pain.  Gastrointestinal: Negative for vomiting.  Musculoskeletal: Positive for back pain.       Chronic low back pain that is unchanged  Neurological: Positive for weakness, light-headedness and numbness. Negative for speech difficulty and headaches.  Psychiatric/Behavioral: The patient is nervous/anxious.   All other systems reviewed and are negative.    Physical Exam Updated Vital Signs BP 116/62 (BP Location: Left Arm)   Pulse 94   Temp 97.9 F (36.6 C) (Oral)   Resp 18   Ht 1.676 m (5\' 6" )   Wt 78 kg   SpO2 99%   BMI 27.76 kg/m   Physical Exam CONSTITUTIONAL: Well developed/well nourished HEAD:  Normocephalic/atraumatic EYES: EOMI/PERRL, no nystagmus, no ptosis ENMT: Mucous membranes moist NECK: supple no meningeal signs SPINE/BACK:entire spine nontender CV: S1/S2 noted, no murmurs/rubs/gallops noted LUNGS: Lungs are clear to auscultation bilaterally, no apparent distress ABDOMEN: soft, nontender, no rebound or guarding GU:no cva tenderness NEURO:Awake/alert, face symmetric, no arm or leg drift is noted Equal 5/5 strength  with hip flexion,knee flex/extension, foot dorsi/plantar flexion Cranial nerves 3/4/5/6/04/17/09/11/12 tested and intact No clonus Sensation to light touch intact in all extremities EXTREMITIES: pulses normal, full ROM, no signs of traumatic injury SKIN: warm, color normal PSYCH: Anxious  ED Treatments / Results  Labs (all labs ordered are listed, but only abnormal results are displayed) Labs Reviewed  BASIC METABOLIC PANEL - Abnormal; Notable for the following components:      Result Value   Chloride 114 (*)    CO2 20 (*)    Glucose, Bld 118 (*)    All other components within normal limits  CBC WITH DIFFERENTIAL/PLATELET  I-STAT BETA HCG BLOOD, ED ([facility], [facility], [facility] ONLY)    EKG EKG Interpretation  Date/Time:  Sunday August 18 2019 02:59:11 EST Ventricular Rate:  92 PR Interval:    QRS Duration: 100 QT Interval:  360 QTC Calculation: 446 R Axis:   79 Text Interpretation: Sinus rhythm Confirmed by Ripley Fraise [phone]) on 08/18/2019 3:06:17 AM   Radiology No results found.  Procedures Procedures  Medications Ordered in ED Medications  sodium chloride 0.9 % bolus 1,000 mL (0 mLs Intravenous Stopped 08/18/19 0339)  sodium chloride 0.9 % bolus 1,000 mL (0 mLs Intravenous Stopped 08/18/19 0427)  metoCLOPramide (REGLAN) injection 10 mg (10 mg Intravenous Given 08/18/19 0423)  diphenhydrAMINE (BENADRYL) injection 25 mg (25 mg Intravenous Given 08/18/19 0423)  ketorolac (TORADOL) 30 MG/ML injection 30 mg (30 mg Intravenous Given 08/18/19 0423)      Initial Impression / Assessment and Plan / ED Course  I have reviewed the triage vital signs and the nursing notes.  Pertinent labs  results that were available during my care of the patient were reviewed by me and considered in my medical decision making (see chart for details).        3:03 AM Patient presents with weakness and fall.  She has had episodes of weakness and tremors in her extremities for several years.  She recently was seen by neurology who felt his episodes may be related to clonus.  Plan was to have an outpatient MRI brain/cervical spine.  This has not been completed as of yet.  Patient is frustrated due to these episodes occurring and causing her to fall without having a diagnosis.  With any movement in the bed she did have tachycardia.  It is possible she is having some orthostatic changes that could be exacerbating her falls as she does report lightheadedness Plan to obtain labs, EKG and orthostatics.  Will give IV fluids. Fortunately there are no neurologic deficits at this time.  We will ensure the patient can ambulate safely. 4:21 AM Patient reports he is now having a migraine headache.  And some of the prior episodes. She has been given 2 L of IV fluid.  Her heart rate did increase to low 100s upon standing.  Blood pressures remained stable We will treat her headache and reassess 6:27 AM Patient reports feeling improved.  No new pain complaints.  However she is still concerned about her neurologic symptoms. Plan to obtain MRI brain/cspine at patient request If negative she can be discharged home 7:00 AM Signed out to dr Ronnald Nian to f/u on MRI If negative she can be discharged home  Final Clinical Impressions(s) / ED Diagnoses   Final diagnoses:  Fall, initial encounter  Paresthesia    ED Discharge Orders    None       Ripley Fraise, MD 08/18/19 [phone]

## 2019-08-18 NOTE — ED Provider Notes (Signed)
Patient signed out to me while awaiting MRI of head and neck.  Has had some patchy paresthesias and falls over the last several weeks.  Has unremarkable work-up today.  Was to get outpatient MRIs that were ordered by her neurologist as concern for MS or other demyelinating issue.  Will obtain MRIs today.  MRI of the head and neck were overall unremarkable.  Patient has some disc disease in the cervical spine but otherwise this is nonspecific and likely arthritic in nature.  Could be why she has some intermittent upper neurological numbness and tingling at times.  We will have her follow-up with her neurologist and discharged in ED in good condition.    This chart was dictated using voice recognition software.  Despite best efforts to proofread,  errors can occur which can change the documentation meaning.     Lennice Sites, DO 08/18/19 1325

## 2019-08-19 ENCOUNTER — Telehealth: Payer: Self-pay | Admitting: Neurology

## 2019-08-19 NOTE — Telephone Encounter (Signed)
Patient called regarding her going to [facility] ED this past weekend. She said that she went for Paresthesia and has fallen Twice. She said that she also has had an MRI. She wanted to let Dr. Carles Collet know, so she could look at the MRI. Please call [phone]. Thank you

## 2019-08-19 NOTE — Telephone Encounter (Signed)
No answer and No VM picked up

## 2019-08-19 NOTE — Telephone Encounter (Signed)
Reviewed.  They were unremarkable.  She probably should start using a walker to avoid those falls.

## 2019-08-19 NOTE — Telephone Encounter (Signed)
1.  No, from purely a SCA perpective, I don't believe the rest of the spine needs imaged 2.  Her examinations were unremarkable and wouldn't contribute to migraine.  T3 was not even imaged on those examinations so perhaps there was a misunderstanding.  I don't treat migraine either and she will need to make sure that she f/u with migraine physician (like  headache clinic) if needs migraine specific care.

## 2019-08-19 NOTE — Telephone Encounter (Signed)
Sent via [employer] as discussed with patient

## 2019-08-19 NOTE — Telephone Encounter (Signed)
Please advise on below  

## 2019-08-19 NOTE — Telephone Encounter (Signed)
She would like to know if she should have a scan done on the rest of her spine and the ED told her that her t3 could be causing her migraines? Please advise

## 2019-08-20 ENCOUNTER — Other Ambulatory Visit (HOSPITAL_COMMUNITY)
Admission: RE | Admit: 2019-08-20 | Discharge: 2019-08-20 | Disposition: A | Discharge status: Home / Self Care | Payer: Commercial Managed Care - PPO | Source: Ambulatory Visit | Attending: Obstetrics and Gynecology | Admitting: Obstetrics and Gynecology

## 2019-08-20 DIAGNOSIS — Z01812 Encounter for preprocedural laboratory examination: Secondary | ICD-10-CM | POA: Diagnosis present

## 2019-08-20 DIAGNOSIS — Z20828 Contact with and (suspected) exposure to other viral communicable diseases: Secondary | ICD-10-CM | POA: Diagnosis not present

## 2019-08-21 ENCOUNTER — Other Ambulatory Visit: Payer: Self-pay

## 2019-08-21 ENCOUNTER — Encounter (HOSPITAL_BASED_OUTPATIENT_CLINIC_OR_DEPARTMENT_OTHER): Payer: Self-pay | Admitting: *Deleted

## 2019-08-21 NOTE — Progress Notes (Signed)
Spoke w/ via phone for pre-op interview--- PT Lab needs dos---- (dr cousins ordered cbc/ urine preg. But pt had lab work / ekg done 08-18-2019 at ED)              Lab results------ CBCdiff, BMP, istat hCG, EKG in chart / epic COVID test ------ 08-20-2019 Arrive at ------- 0745 NPO after ------ MN Medications to take morning of surgery ----- Zoloft w/ sips of water Diabetic medication ----- n/a Patient Special Instructions ----- n/a Pre-Op special Istructions ----- n/a Patient verbalized understanding of instructions that were given at this phone interview. Patient denies shortness of breath, chest pain, fever, cough a this phone interview.

## 2019-08-22 LAB — NOVEL CORONAVIRUS, NAA (HOSP ORDER, SEND-OUT TO REF LAB; TAT 18-24 HRS): SARS-CoV-2, NAA: NOT DETECTED

## 2019-08-23 ENCOUNTER — Encounter (HOSPITAL_BASED_OUTPATIENT_CLINIC_OR_DEPARTMENT_OTHER): Payer: Self-pay | Admitting: Certified Registered Nurse Anesthetist

## 2019-08-23 ENCOUNTER — Encounter (HOSPITAL_BASED_OUTPATIENT_CLINIC_OR_DEPARTMENT_OTHER)
Admission: RE | Disposition: A | Discharge status: Home / Self Care | Payer: Self-pay | Source: Home / Self Care | Attending: Obstetrics and Gynecology

## 2019-08-23 ENCOUNTER — Other Ambulatory Visit: Payer: Self-pay

## 2019-08-23 ENCOUNTER — Ambulatory Visit (HOSPITAL_BASED_OUTPATIENT_CLINIC_OR_DEPARTMENT_OTHER): Payer: Commercial Managed Care - PPO | Admitting: Certified Registered Nurse Anesthetist

## 2019-08-23 ENCOUNTER — Ambulatory Visit (HOSPITAL_BASED_OUTPATIENT_CLINIC_OR_DEPARTMENT_OTHER)
Admission: RE | Admit: 2019-08-23 | Discharge: 2019-08-23 | Disposition: A | Discharge status: Home / Self Care | Payer: Commercial Managed Care - PPO | Attending: Obstetrics and Gynecology | Admitting: Obstetrics and Gynecology

## 2019-08-23 DIAGNOSIS — Z87891 Personal history of nicotine dependence: Secondary | ICD-10-CM | POA: Diagnosis not present

## 2019-08-23 DIAGNOSIS — I7300 Raynaud's syndrome without gangrene: Secondary | ICD-10-CM | POA: Diagnosis not present

## 2019-08-23 DIAGNOSIS — N920 Excessive and frequent menstruation with regular cycle: Secondary | ICD-10-CM | POA: Diagnosis present

## 2019-08-23 DIAGNOSIS — Z85828 Personal history of other malignant neoplasm of skin: Secondary | ICD-10-CM | POA: Diagnosis not present

## 2019-08-23 DIAGNOSIS — N840 Polyp of corpus uteri: Secondary | ICD-10-CM | POA: Insufficient documentation

## 2019-08-23 HISTORY — DX: Tremor, unspecified: R25.1

## 2019-08-23 HISTORY — DX: Personal history of suicidal behavior: Z91.51

## 2019-08-23 HISTORY — DX: Personal history of other malignant neoplasm of skin: Z85.828

## 2019-08-23 HISTORY — DX: Personal history of other malignant neoplasm of skin: Z98.890

## 2019-08-23 HISTORY — DX: Personal history of cervical dysplasia: Z87.410

## 2019-08-23 HISTORY — DX: Anesthesia of skin: R20.0

## 2019-08-23 HISTORY — DX: Personal history of other specified conditions: Z87.898

## 2019-08-23 HISTORY — PX: DILATATION & CURETTAGE/HYSTEROSCOPY WITH MYOSURE: SHX6511

## 2019-08-23 LAB — POCT PREGNANCY, URINE: Preg Test, Ur: NEGATIVE

## 2019-08-23 SURGERY — DILATATION & CURETTAGE/HYSTEROSCOPY WITH MYOSURE
Anesthesia: Monitor Anesthesia Care | Site: Vagina

## 2019-08-23 MED ORDER — LIDOCAINE HCL (CARDIAC) PF 100 MG/5ML IV SOSY
PREFILLED_SYRINGE | INTRAVENOUS | Status: DC | PRN
  Administered 2019-08-23: 80 mg via INTRAVENOUS

## 2019-08-23 MED ORDER — HYDROMORPHONE HCL 1 MG/ML IJ SOLN
0.2500 mg | INTRAMUSCULAR | Status: DC | PRN
  Filled 2019-08-23: qty 0.5

## 2019-08-23 MED ORDER — PROPOFOL 10 MG/ML IV BOLUS
INTRAVENOUS | Status: AC
  Filled 2019-08-23: qty 20

## 2019-08-23 MED ORDER — PROPOFOL 10 MG/ML IV BOLUS
INTRAVENOUS | Status: DC | PRN
  Administered 2019-08-23: 200 mg via INTRAVENOUS

## 2019-08-23 MED ORDER — SCOPOLAMINE 1 MG/3DAYS TD PT72
1.0000 | MEDICATED_PATCH | Freq: Once | TRANSDERMAL | Status: DC
  Filled 2019-08-23: qty 1

## 2019-08-23 MED ORDER — ONDANSETRON HCL 4 MG/2ML IJ SOLN
INTRAMUSCULAR | Status: DC | PRN
  Administered 2019-08-23: 4 mg via INTRAVENOUS

## 2019-08-23 MED ORDER — SODIUM CHLORIDE 0.9 % IR SOLN
Status: DC | PRN
  Administered 2019-08-23: 3000 mL

## 2019-08-23 MED ORDER — KETOROLAC TROMETHAMINE 30 MG/ML IJ SOLN
INTRAMUSCULAR | Status: AC
  Filled 2019-08-23: qty 2

## 2019-08-23 MED ORDER — FAMOTIDINE 20 MG PO TABS
20.0000 mg | ORAL_TABLET | Freq: Once | ORAL | Status: AC
  Administered 2019-08-23: 20 mg via ORAL
  Filled 2019-08-23: qty 1

## 2019-08-23 MED ORDER — PROMETHAZINE HCL 25 MG/ML IJ SOLN
INTRAMUSCULAR | Status: AC
  Filled 2019-08-23: qty 1

## 2019-08-23 MED ORDER — KETOROLAC TROMETHAMINE 30 MG/ML IJ SOLN
INTRAMUSCULAR | Status: DC | PRN
  Administered 2019-08-23: 30 mg via INTRAMUSCULAR
  Administered 2019-08-23: 30 mg via INTRAVENOUS

## 2019-08-23 MED ORDER — DEXAMETHASONE SODIUM PHOSPHATE 4 MG/ML IJ SOLN
INTRAMUSCULAR | Status: DC | PRN
  Administered 2019-08-23: 5 mg via INTRAVENOUS

## 2019-08-23 MED ORDER — MIDAZOLAM HCL 2 MG/2ML IJ SOLN
INTRAMUSCULAR | Status: AC
  Filled 2019-08-23: qty 2

## 2019-08-23 MED ORDER — FENTANYL CITRATE (PF) 100 MCG/2ML IJ SOLN
INTRAMUSCULAR | Status: DC | PRN
  Administered 2019-08-23: 50 ug via INTRAVENOUS

## 2019-08-23 MED ORDER — IBUPROFEN 800 MG PO TABS: 800.0000 mg | ORAL_TABLET | Freq: Three times a day (TID) | ORAL | 5 refills | Status: AC | PRN

## 2019-08-23 MED ORDER — FENTANYL CITRATE (PF) 100 MCG/2ML IJ SOLN
INTRAMUSCULAR | Status: AC
  Filled 2019-08-23: qty 2

## 2019-08-23 MED ORDER — OXYCODONE HCL 5 MG PO TABS
5.0000 mg | ORAL_TABLET | Freq: Once | ORAL | Status: DC | PRN
  Filled 2019-08-23: qty 1

## 2019-08-23 MED ORDER — ACETAMINOPHEN 500 MG PO TABS
1000.0000 mg | ORAL_TABLET | Freq: Once | ORAL | Status: AC
  Administered 2019-08-23: 09:00:00 1000 mg via ORAL
  Filled 2019-08-23: qty 2

## 2019-08-23 MED ORDER — ACETAMINOPHEN 500 MG PO TABS
ORAL_TABLET | ORAL | Status: AC
  Filled 2019-08-23: qty 2

## 2019-08-23 MED ORDER — LIDOCAINE 2% (20 MG/ML) 5 ML SYRINGE
INTRAMUSCULAR | Status: AC
  Filled 2019-08-23: qty 5

## 2019-08-23 MED ORDER — PROMETHAZINE HCL 25 MG/ML IJ SOLN
6.2500 mg | INTRAMUSCULAR | Status: DC | PRN
  Administered 2019-08-23: 6.25 mg via INTRAVENOUS
  Filled 2019-08-23: qty 1

## 2019-08-23 MED ORDER — PROMETHAZINE HCL 25 MG/ML IJ SOLN
6.2500 mg | INTRAMUSCULAR | Status: AC | PRN
  Administered 2019-08-23 (×2): 6.25 mg via INTRAVENOUS
  Filled 2019-08-23: qty 1

## 2019-08-23 MED ORDER — SCOPOLAMINE 1 MG/3DAYS TD PT72
MEDICATED_PATCH | TRANSDERMAL | Status: AC
  Filled 2019-08-23: qty 1

## 2019-08-23 MED ORDER — MIDAZOLAM HCL 5 MG/5ML IJ SOLN
INTRAMUSCULAR | Status: DC | PRN
  Administered 2019-08-23: 2 mg via INTRAVENOUS

## 2019-08-23 MED ORDER — LACTATED RINGERS IV SOLN
INTRAVENOUS | Status: DC
  Administered 2019-08-23: 12:00:00 via INTRAVENOUS
  Administered 2019-08-23: 50 mL/h via INTRAVENOUS
  Filled 2019-08-23: qty 1000

## 2019-08-23 MED ORDER — OXYCODONE HCL 5 MG/5ML PO SOLN
5.0000 mg | Freq: Once | ORAL | Status: DC | PRN
  Filled 2019-08-23: qty 5

## 2019-08-23 MED ORDER — FAMOTIDINE 20 MG PO TABS
ORAL_TABLET | ORAL | Status: AC
  Filled 2019-08-23: qty 1

## 2019-08-23 MED ORDER — KETOROLAC TROMETHAMINE 30 MG/ML IJ SOLN
30.0000 mg | Freq: Once | INTRAMUSCULAR | Status: DC | PRN
  Filled 2019-08-23: qty 1

## 2019-08-23 MED ORDER — ONDANSETRON HCL 4 MG/2ML IJ SOLN
INTRAMUSCULAR | Status: AC
  Filled 2019-08-23: qty 2

## 2019-08-23 SURGICAL SUPPLY — 25 items
BIPOLAR CUTTING LOOP 21FR (ELECTRODE)
CANISTER SUCT 3000ML PPV (MISCELLANEOUS) ×2 IMPLANT
CATH ROBINSON RED A/P 16FR (CATHETERS) IMPLANT
COVER WAND RF STERILE (DRAPES) ×2 IMPLANT
DEVICE MYOSURE LITE (MISCELLANEOUS) IMPLANT
DEVICE MYOSURE REACH (MISCELLANEOUS) ×1 IMPLANT
DILATOR CANAL MILEX (MISCELLANEOUS) IMPLANT
GAUZE 4X4 16PLY RFD (DISPOSABLE) ×2 IMPLANT
GLOVE BIOGEL PI IND STRL 7.0 (GLOVE) ×1 IMPLANT
GLOVE BIOGEL PI INDICATOR 7.0 (GLOVE) ×1
GLOVE ECLIPSE 6.5 STRL STRAW (GLOVE) ×2 IMPLANT
GOWN STRL REUS W/TWL LRG LVL3 (GOWN DISPOSABLE) ×2 IMPLANT
IV NS IRRIG 3000ML ARTHROMATIC (IV SOLUTION) ×2 IMPLANT
KIT PROCEDURE FLUENT (KITS) ×2 IMPLANT
KIT TURNOVER CYSTO (KITS) ×2 IMPLANT
LOOP CUTTING BIPOLAR 21FR (ELECTRODE) IMPLANT
MYOSURE XL FIBROID (MISCELLANEOUS)
PACK VAGINAL MINOR WOMEN LF (CUSTOM PROCEDURE TRAY) ×2 IMPLANT
PAD OB MATERNITY 4.3X12.25 (PERSONAL CARE ITEMS) ×2 IMPLANT
PAD PREP 24X48 CUFFED NSTRL (MISCELLANEOUS) ×2 IMPLANT
SEAL CERVICAL OMNI LOK (ABLATOR) IMPLANT
SEAL ROD LENS SCOPE MYOSURE (ABLATOR) ×2 IMPLANT
SYSTEM TISS REMOVAL MYOSURE XL (MISCELLANEOUS) IMPLANT
TOWEL OR 17X26 10 PK STRL BLUE (TOWEL DISPOSABLE) ×2 IMPLANT
WATER STERILE IRR 500ML POUR (IV SOLUTION) ×2 IMPLANT

## 2019-08-23 NOTE — Brief Op Note (Signed)
08/23/2019  10:42 AM  PATIENT:  Sharon Hartman  38 y.o. female  PRE-OPERATIVE DIAGNOSIS:  Abnormal Uterine Bleeding, Endometrial Mass  POST-OPERATIVE DIAGNOSIS:  Abnormal Uterine Bleeding, Endometrial polyps  PROCEDURE:  Diagnostic hysteroscopy, D&C, hysteroscopic resection of endometrial polyps  SURGEON:  Surgeon(s) and Role:    * Servando Salina, MD - Primary  PHYSICIAN ASSISTANT:   ASSISTANTS: none  FINDING: tubal ostia seen bilaterally. Multiple polypoid lesions ANESTHESIA:   general  EBL:  20 mL   BLOOD ADMINISTERED:none  DRAINS: none   LOCAL MEDICATIONS USED:  NONE  SPECIMEN:  Source of Specimen:  emc with polyps  DISPOSITION OF SPECIMEN:  PATHOLOGY  COUNTS:  YES  TOURNIQUET:  * No tourniquets in log *  DICTATION: .Other Dictation: Dictation Number [id number]  PLAN OF CARE: Discharge to home after PACU  PATIENT DISPOSITION:  PACU - hemodynamically stable.   Delay start of Pharmacological VTE agent (>24hrs) due to surgical blood loss or risk of bleeding: no

## 2019-08-23 NOTE — Anesthesia Preprocedure Evaluation (Addendum)
Anesthesia Evaluation  Patient identified by MRN, date of birth, ID band Patient awake    Reviewed: Allergy & Precautions, NPO status , Patient's Chart, lab work & pertinent test results  Airway Mallampati: II  TM Distance: >3 FB Neck ROM: Full    Dental no notable dental hx.    Pulmonary former smoker,    Pulmonary exam normal breath sounds clear to auscultation       Cardiovascular negative cardio ROS Normal cardiovascular exam Rhythm:Regular Rate:Normal  ECG: SR, rate 92   Neuro/Psych  Headaches, PSYCHIATRIC DISORDERS Anxiety Depression Bipolar Disorder ADD (attention deficit disorder)   GI/Hepatic negative GI ROS, Neg liver ROS,   Endo/Other  negative endocrine ROS  Renal/GU negative Renal ROS     Musculoskeletal Raynaud's disease   Abdominal   Peds  Hematology negative hematology ROS (+)   Anesthesia Other Findings Abnormal Uterine Bleeding, Endometrial Mass  Reproductive/Obstetrics hcg negative                            Anesthesia Physical Anesthesia Plan  ASA: II  Anesthesia Plan: General   Post-op Pain Management:    Induction: Intravenous  PONV Risk Score and Plan: 4 or greater and Midazolam, Dexamethasone, Ondansetron and Treatment may vary due to age or medical condition  Airway Management Planned: LMA  Additional Equipment:   Intra-op Plan:   Post-operative Plan: Extubation in OR  Informed Consent: I have reviewed the patients History and Physical, chart, labs and discussed the procedure including the risks, benefits and alternatives for the proposed anesthesia with the patient or authorized representative who has indicated his/her understanding and acceptance.     Dental advisory given  Plan Discussed with: CRNA  Anesthesia Plan Comments:        Anesthesia Quick Evaluation

## 2019-08-23 NOTE — H&P (Signed)
Sharon Hartman is an 38 y.o. female [id number] WF present for surgical mgmt of menorrhagia, endometrial mass noted on sonohysterogram  Pertinent Gynecological History: Menses: flow is excessive with use of 5 pads or tampons on heaviest days Bleeding: menorrhagia Contraception: condoms DES exposure: denies Blood transfusions: none Sexually transmitted diseases: no past history Previous GYN Procedures: DNC  Last mammogram: n/a Date:n/a Last pap: normal Date:10/2017 OB History: G2P1011   Menstrual History: Menarche age: n/a Patient's last menstrual period was 08/21/2019 (exact date).    Past Medical History:  Diagnosis Date  . Abnormal uterine bleeding (AUB)   . ADD (attention deficit disorder)   . Anxiety   . Bipolar affective disorder in remission ([place])   . Depression   . Endometrial mass   . History of basal cell carcinoma (BCC) excision   . History of cervical dysplasia    CIN II  s/p LEEP 06/ 2005  . History of recent fall    08-18-2019  due to feet numbness  . History of suicide attempt    2003; 2006  . History of syncope    02/ 2015  w/ palpitations and pregnant--- evaluation by cardiology, dr Debara Pickett, note in epic 03/ 2015, work-up done w/ normal echo and event monitor showed SR with isolated PVCs/ PACs  . Hyperlipidemia   . Migraine   . Numbness    bilateral hands and feet due to raynaurd's disease  . Raynaud's disease    rheumotology--- dr Estanislado Pandy--  with bilateral hand and feet numbness--- pt takes norvasc  . Tremor, unspecified    intermittant left hand/ wrist tremor with dorsiflexion  ( neurology evaluation by dr tat note in epic 08-15-2019, felt to be clonus)    Past Surgical History:  Procedure Laterality Date  . DILATION AND CURETTAGE OF UTERUS  1999  . GANGLION CYST EXCISION Right 2011   foot  . LEEP  03-31-2004   @[facility]    CIN II  . TONSILLECTOMY  1998  . WISDOM TOOTH EXTRACTION  1999    Family History  Problem Relation Age of Onset  . Heart murmur  Mother   . Diabetes Mother   . Hepatitis Mother        auto immune   . Breast cancer Paternal Grandmother   . Hypertension Paternal Grandmother   . Hyperlipidemia Paternal Grandmother   . Heart disease Paternal Grandmother   . Hypertension Paternal Grandfather   . Heart disease Paternal Grandfather        CABG  . Heart attack Paternal Grandfather        X4  . Hyperlipidemia Paternal Grandfather   . Hypertension Father   . Hyperlipidemia Father   . Hashimoto's thyroiditis Sister   . Depression Sister   . Breast cancer Sister   . Anxiety disorder Sister   . Scoliosis Brother   . Drug abuse Brother   . Autism Son     Social History:  reports that she quit smoking about 15 years ago. Her smoking use included cigarettes. She quit after 7.00 years of use. She has never used smokeless tobacco. She reports previous alcohol use. She reports that she does not use drugs.  Allergies: No Known Allergies  No medications prior to admission.    Review of Systems  All other systems reviewed and are negative.   Height 5\' 6"  (1.676 m), weight 77.1 kg, last menstrual period 08/21/2019. Physical Exam  Constitutional: She is oriented to person, place, and time. She appears well-developed and well-nourished.  HENT:  Head: Atraumatic.  Eyes: EOM are normal.  Neck: Neck supple.  Cardiovascular: Regular rhythm.  Respiratory: Effort normal.  GI: Soft.  Genitourinary:    Vagina and uterus normal.     Genitourinary Comments: Vulva no lesion  cervix closed, parous Adnexa nl   Musculoskeletal:        General: No edema.  Neurological: She is alert and oriented to person, place, and time.  Skin: Skin is warm and dry.  Psychiatric: She has a normal mood and affect.    No results found for this or any previous visit (from the past 24 hour(s)).  No results found.  Assessment/Plan: Menorrhagia Endometrial mass Endometrial thickening on sonogram P) dx hysteroscopy, D&C, resection of  endometrial mass. Risk of surgery includes infection, bleeding, poss need for blood transfusion and its risk, uterine perforation ( 10/998) and its risk, thermal injury, fluid overload and its mgmt. All ? answered  Blakelynn Scheeler A Melita Villalona 08/23/2019, 5:51 AM

## 2019-08-23 NOTE — Anesthesia Procedure Notes (Signed)
Procedure Name: LMA Insertion Date/Time: 08/23/2019 10:17 AM Performed by: Bufford Spikes, CRNA Pre-anesthesia Checklist: Patient identified, Emergency Drugs available, Suction available and Patient being monitored Patient Re-evaluated:Patient Re-evaluated prior to induction Oxygen Delivery Method: Circle system utilized Preoxygenation: Pre-oxygenation with 100% oxygen Induction Type: IV induction Ventilation: Mask ventilation without difficulty LMA: LMA inserted LMA Size: 4.0 Number of attempts: 1 Airway Equipment and Method: Bite block Placement Confirmation: positive ETCO2 Tube secured with: Tape Dental Injury: Teeth and Oropharynx as per pre-operative assessment

## 2019-08-23 NOTE — Discharge Instructions (Signed)
CALL  IF TEMP>100.4, NOTHING PER VAGINA X 1 WK, CALL IF SOAKING A MAXI  PAD EVERY HOUR OR MORE FREQUENTLY DISCHARGE INSTRUCTIONS: D&C The following instructions have been prepared to help you care for yourself upon your return home.   Personal hygiene:  Use sanitary pads for vaginal drainage, not tampons.  Shower the day after your procedure.  NO tub baths, pools or Jacuzzis for 2-3 weeks.  Wipe front to back after using the bathroom.  Activity and limitations:  Do NOT drive or operate any equipment for 24 hours. The effects of anesthesia are still present and drowsiness may result.  Do NOT rest in bed all day.  Walking is encouraged.  Walk up and down stairs slowly.  You may resume your normal activity in one to two days or as indicated by your physician.  Sexual activity: NO intercourse for at least 2 weeks after the procedure, or as indicated by your physician.  Diet: Eat a light meal as desired this evening. You may resume your usual diet tomorrow.  Return to work: You may resume your work activities in one to two days or as indicated by your doctor.  What to expect after your surgery: Expect to have vaginal bleeding/discharge for 2-3 days and spotting for up to 10 days. It is not unusual to have soreness for up to 1-2 weeks. You may have a slight burning sensation when you urinate for the first day. Mild cramps may continue for a couple of days. You may have a regular period in 2-6 weeks.  Call your doctor for any of the following:  Excessive vaginal bleeding, saturating and changing one pad every hour.  Inability to urinate 6 hours after discharge from hospital.  Pain not relieved by pain medication.  Fever of 100.4 F or greater.  Unusual vaginal discharge or odor.   Call for an appointment:      Post Anesthesia Home Care Instructions  Activity: Get plenty of rest for the remainder of the day. A responsible individual must stay with you for 24 hours  following the procedure.  For the next 24 hours, DO NOT: -Drive a car -Paediatric nurse -Drink alcoholic beverages -Take any medication unless instructed by your physician -Make any legal decisions or sign important papers.  Meals: Start with liquid foods such as gelatin or soup. Progress to regular foods as tolerated. Avoid greasy, spicy, heavy foods. If nausea and/or vomiting occur, drink only clear liquids until the nausea and/or vomiting subsides. Call your physician if vomiting continues.  Special Instructions/Symptoms: Your throat may feel dry or sore from the anesthesia or the breathing tube placed in your throat during surgery. If this causes discomfort, gargle with warm salt water. The discomfort should disappear within 24 hours.  If you had a scopolamine patch placed behind your ear for the management of post- operative nausea and/or vomiting:  1. The medication in the patch is effective for 72 hours, after which it should be removed.  Wrap patch in a tissue and discard in the trash. Wash hands thoroughly with soap and water. 2. You may remove the patch earlier than 72 hours if you experience unpleasant side effects which may include dry mouth, dizziness or visual disturbances. 3. Avoid touching the patch. Wash your hands with soap and water after contact with the patch.

## 2019-08-23 NOTE — Transfer of Care (Signed)
Immediate Anesthesia Transfer of Care Note  Patient: Sharon Hartman  Procedure(s) Performed: DILATATION & CURETTAGE/HYSTEROSCOPY WITH MYOSURE (N/A Vagina )  Patient Location: PACU  Anesthesia Type:General  Level of Consciousness: awake, alert  and oriented  Airway & Oxygen Therapy: Patient Spontanous Breathing and Patient connected to nasal cannula oxygen  Post-op Assessment: Report given to RN and Post -op Vital signs reviewed and stable  Post vital signs: Reviewed and stable  Last Vitals:  Vitals Value Taken Time  BP 106/58 08/23/19 1215  Temp 36.7 C 08/23/19 1044  Pulse 70 08/23/19 1229  Resp 16 08/23/19 1229  SpO2 98 % 08/23/19 1229  Vitals shown include unvalidated device data.  Last Pain:  Vitals:   08/23/19 1200  TempSrc:   PainSc: 0-No pain      Patients Stated Pain Goal: 3 ([id number])  Complications: No apparent anesthesia complications

## 2019-08-23 NOTE — Op Note (Signed)
Sharon Hartman, [facility] MEDICAL RECORD [id number] ACCOUNT [id number] DATE OF BIRTH:07-10-1981 FACILITY: [facility] LOCATION: [facility] PHYSICIAN:Yanelis Osika A. Margeaux Swantek, MD  OPERATIVE REPORT  DATE OF PROCEDURE:  08/23/2019  PREOPERATIVE DIAGNOSIS:  Abnormal uterine bleeding, endometrial mass.  PROCEDURE PERFORMED:  Diagnostic hysteroscopy, hysteroscopic resection, endometrial polyps, dilation and curettage.  POSTOPERATIVE DIAGNOSIS:  Abnormal uterine bleeding, endometrial polyps.  ANESTHESIA:  General.  SURGEON:  Servando Salina, MD  ASSISTANT:  None.  DESCRIPTION OF PROCEDURE:  Under adequate general anesthesia, the patient was placed in dorsal lithotomy position.  She was sterilely prepped and draped in usual fashion.  The bladder was catheterized a moderate amount of urine.  Examination under  anesthesia revealed an anteverted uterus.  No adnexal masses could be appreciated.  Bivalve speculum was placed in the vagina.  Single tooth tenaculum was placed on the anterior lip of the cervix.  The cervix was then serially dilated up to #19 [facility]  dilator.  A diagnostic hysteroscope was introduced into the uterine cavity.  Both tubal ostia could be seen.  Multiple polypoid lesions were noted.  Using the REACH resectoscope, the endometrial polyps and endometrial wall was resected.  When all tissue  was felt to have been removed, all instruments were then removed from the vagina.  No lesions in the endocervical canal was noted.  SPECIMEN:  Endometrial curettings with polyp sent to pathology.  ESTIMATED BLOOD LOSS:  5 mL.  FLUID DEFICIT: 170 ml.  COMPLICATIONS:  None.  DISPOSITION:  The patient tolerated the procedure well.  The patient was transferred to the recovery room in stable condition.  TN/NUANCE  D:08/23/2019 T:08/23/2019 JOB:[id number]

## 2019-08-24 NOTE — Anesthesia Postprocedure Evaluation (Signed)
Anesthesia Post Note  Patient: DESREE AFZAL  Procedure(s) Performed: DILATATION & CURETTAGE/HYSTEROSCOPY WITH MYOSURE (N/A Vagina )     Patient location during evaluation: PACU Anesthesia Type: General Level of consciousness: awake and alert Pain management: pain level controlled Vital Signs Assessment: post-procedure vital signs reviewed and stable Respiratory status: spontaneous breathing, nonlabored ventilation, respiratory function stable and patient connected to nasal cannula oxygen Cardiovascular status: blood pressure returned to baseline and stable Postop Assessment: no apparent nausea or vomiting Anesthetic complications: no    Last Vitals:  Vitals:   08/23/19 1230 08/23/19 1300  BP: (!) 110/57 106/63  Pulse: 75 81  Resp: 14 13  Temp:  36.8 C  SpO2: 97% 100%    Last Pain:  Vitals:   08/23/19 1300  TempSrc:   PainSc: 0-No pain                 Ouita Nish P Joban Colledge

## 2019-08-26 ENCOUNTER — Encounter (HOSPITAL_BASED_OUTPATIENT_CLINIC_OR_DEPARTMENT_OTHER): Payer: Self-pay | Admitting: Obstetrics and Gynecology

## 2019-08-26 LAB — SURGICAL PATHOLOGY

## 2019-09-09 ENCOUNTER — Telehealth: Payer: Self-pay | Admitting: Neurology

## 2019-09-09 NOTE — Telephone Encounter (Signed)
Left message with the after hour service on 09-09-19 @ 12:45 pm  Caller states that she has a EMG on Thursday and needs to know if she needs to keep the appt. Got reminders tremors in all extremities falling.  Please call

## 2019-09-12 ENCOUNTER — Other Ambulatory Visit: Payer: Self-pay

## 2019-09-12 ENCOUNTER — Encounter: Payer: Self-pay | Admitting: Neurology

## 2019-09-12 ENCOUNTER — Encounter: Payer: Commercial Managed Care - PPO | Admitting: Neurology

## 2019-09-12 ENCOUNTER — Ambulatory Visit: Payer: No Typology Code available for payment source | Admitting: Neurology

## 2019-09-12 VITALS — BP 118/71 | HR 82 | Temp 97.4°F | Ht 66.0 in | Wt 173.0 lb

## 2019-09-12 DIAGNOSIS — R251 Tremor, unspecified: Secondary | ICD-10-CM | POA: Insufficient documentation

## 2019-09-12 DIAGNOSIS — G43709 Chronic migraine without aura, not intractable, without status migrainosus: Secondary | ICD-10-CM | POA: Diagnosis not present

## 2019-09-12 DIAGNOSIS — IMO0002 Reserved for concepts with insufficient information to code with codable children: Secondary | ICD-10-CM

## 2019-09-12 MED ORDER — ELETRIPTAN HYDROBROMIDE 20 MG PO TABS: 20.0000 mg | ORAL_TABLET | ORAL | 6 refills | Status: DC | PRN

## 2019-09-12 NOTE — Progress Notes (Signed)
PATIENT: Sharon Hartman DOB: 1980/12/09  Chief Complaint  Patient presents with  . Migraine    She was having nearly daily headaches until recently.  She was placed on Emgality three weeks ago.  After one injection, she has not had any further issues.  She was previously on verapamil.  She has failed sumatriptan and rizatriptan (made pain worse).  She had just been taking Advil and laying down to rest to treat headaches.   . Numbness    Reports she has intermittent numbness in her feet, fingers and lips.  Hx of Raynaud's disease.       . Tremors    She has cramps and tremors in her right foot, along with occasional foot drop.  Also, tremors in bilateral hands, worse on right.   Marland Kitchen PCP    Donald Prose, MD     HISTORICAL  Sharon Hartman is a 38 year old female, seen in request by her primary care physician Dr. Donald Prose for evaluation of chronic migraine, intermittent bilateral feet and hands paresthesia, tremor, initial evaluation was on September 12, 2019.  I have reviewed and summarized the referring note from the referring physician.  She had a past medical history of depression, is taking Zoloft 10 mg daily, also had ADHD, taking Adderall 10 mg daily  She reported long history of migraine headache, typical migraine are holoacranial throbbing headache with associated light, noise sensitivity, for a while, she was having almost daily headache, previously respond to Imitrex, no longer responsive, tried Maxalt with limited help actually made her headache worse.  She was put on Emgality in November 2020, she noticed significant improvement of her headache, she only has headaches couple times each week, take over-the-counter Advil, sleeping for headache control,  She noticed gradual onset bilateral hands tremor on and off since 2015, she used to work as a Warden/ranger, she has such hand tremor, who no longer start IV, now working as a Chartered loss adjuster, her hand tremor were intermittent, sometimes  difficulty holding utensils, there was no family history of essential tremor, but her first-degree cousin had early onset Parkinson's disease, her tremor certainly make her worry about the possibility of similar disease, she desires DATscan  In addition, she complains of intermittent bilateral upper and lower extremity paresthesia, she denies gait abnormality, sometimes painful right first toe extension, and rest 4 toes forceful flexion.  She also reported a history of Raynaud's disease recently had extensive evaluations.  I personally reviewed MRI of the brain, cervical spine November 2020, there was no significant abnormality  Laboratory evaluations showed normal CBC, B12, prolactin level, thyroid functional test, vitamin D was mildly decreased 23, negative ANCA, cryoglobulin, hepatitis B core antibody, Sjogren, double-stranded DNA, C3-4, ANA, rheumatoid factor, BMP showed mild elevated glucose of 118  REVIEW OF SYSTEMS: Full 14 system review of systems performed and notable only for as above All other review of systems were negative.  ALLERGIES: No Known Allergies  HOME MEDICATIONS: Current Outpatient Medications  Medication Sig Dispense Refill  . amLODipine (NORVASC) 5 MG tablet Take 1 tablet (5 mg total) by mouth at bedtime. (Patient taking differently: Take 5 mg by mouth at bedtime. ) 90 tablet 0  . amphetamine-dextroamphetamine (ADDERALL) 10 MG tablet Take 10 mg by mouth daily with breakfast.    . aspirin EC 81 MG tablet Take 81 mg by mouth daily.    . Galcanezumab-gnlm (EMGALITY) 120 MG/ML SOAJ Inject 120 mg into the skin every 30 (thirty) days.    Marland Kitchen  ibuprofen (ADVIL) 800 MG tablet Take 1 tablet (800 mg total) by mouth every 8 (eight) hours as needed for cramping. 30 tablet 5  . lisdexamfetamine (VYVANSE) 50 MG capsule Take 50 mg by mouth daily.    Marland Kitchen loratadine (CLARITIN) 10 MG tablet Take 10 mg by mouth daily.    . Magnesium 250 MG TABS Take 250 mg by mouth daily.     . Multiple  Vitamins-Minerals (MULTIVITAMIN ADULT PO) Take 1 tablet by mouth daily.     . ondansetron (ZOFRAN ODT) 4 MG disintegrating tablet Take 1 tablet (4 mg total) by mouth every 8 (eight) hours as needed for nausea. 10 tablet 0  . Probiotic Product (PROBIOTIC PO) Take 1 capsule by mouth daily.     . Riboflavin 400 MG TABS Take 400 mg by mouth daily.     . sertraline (ZOLOFT) 100 MG tablet Take 100 mg by mouth daily.    . Wheat Dextrin (BENEFIBER PO) Take 1 Scoop by mouth daily.      No current facility-administered medications for this visit.     PAST MEDICAL HISTORY: Past Medical History:  Diagnosis Date  . Abnormal uterine bleeding (AUB)   . ADD (attention deficit disorder)   . Anxiety   . Bipolar affective disorder in remission ([place])   . Depression   . Endometrial mass   . History of basal cell carcinoma (BCC) excision   . History of cervical dysplasia    CIN II  s/p LEEP 06/ 2005  . History of recent fall    08-18-2019  due to feet numbness  . History of suicide attempt    2003; 2006  . History of syncope    02/ 2015  w/ palpitations and pregnant--- evaluation by cardiology, dr Debara Pickett, note in epic 03/ 2015, work-up done w/ normal echo and event monitor showed SR with isolated PVCs/ PACs  . Hyperlipidemia   . Migraine   . Numbness    bilateral hands and feet due to raynaurd's disease  . Raynaud's disease    rheumotology--- dr Estanislado Pandy--  with bilateral hand and feet numbness--- pt takes norvasc  . Tremor   . Tremor, unspecified    intermittant left hand/ wrist tremor with dorsiflexion  ( neurology evaluation by dr tat note in epic 08-15-2019, felt to be clonus)    PAST SURGICAL HISTORY: Past Surgical History:  Procedure Laterality Date  . basel cell excision  2017   back  . DILATATION & CURETTAGE/HYSTEROSCOPY WITH MYOSURE N/A 08/23/2019   Procedure: DILATATION & CURETTAGE/HYSTEROSCOPY WITH MYOSURE;  Surgeon: Servando Salina, MD;  Location: [facility];   Service: Gynecology;  Laterality: N/A;  . DILATION AND CURETTAGE OF UTERUS  1999  . GANGLION CYST EXCISION Right 2011   foot  . LEEP  03-31-2004   @[facility]    CIN II  . TONSILLECTOMY  1998  . WISDOM TOOTH EXTRACTION  1999    FAMILY HISTORY: Family History  Problem Relation Age of Onset  . Heart murmur Mother   . Diabetes Mother   . Hepatitis Mother        auto immune   . Breast cancer Paternal Grandmother   . Hypertension Paternal Grandmother   . Hyperlipidemia Paternal Grandmother   . Heart disease Paternal Grandmother   . Hypertension Paternal Grandfather   . Heart disease Paternal Grandfather        CABG  . Heart attack Paternal Grandfather        X4  . Hyperlipidemia  Paternal Grandfather   . Hypertension Father   . Hyperlipidemia Father   . Hashimoto's thyroiditis Sister   . Depression Sister   . Breast cancer Sister   . Anxiety disorder Sister   . Scoliosis Brother   . Drug abuse Brother   . Autism Son     SOCIAL HISTORY: Social History   Socioeconomic History  . Marital status: Married    Spouse name: Not on file  . Number of children: 1  . Years of education: college  . Highest education level: Not on file  Occupational History  . Occupation: Therapist, sports  Social Needs  . Financial resource strain: Not on file  . Food insecurity    Worry: Not on file    Inability: Not on file  . Transportation needs    Medical: Not on file    Non-medical: Not on file  Tobacco Use  . Smoking status: Former Smoker    Years: 7.00    Types: Cigarettes    Quit date: 08/20/2004    Years since quitting: 15.0  . Smokeless tobacco: Never Used  Substance and Sexual Activity  . Alcohol use: Not Currently    Frequency: Never  . Drug use: Never  . Sexual activity: Yes    Birth control/protection: None  Lifestyle  . Physical activity    Days per week: 4 days    Minutes per session: 40 min  . Stress: Rather much  Relationships  . Social connections    Talks on phone: More than  three times a week    Gets together: Once a week    Attends religious service: Never    Active member of club or organization: No    Attends meetings of clubs or organizations: Never    Relationship status: Living with partner  . Intimate partner violence    Fear of current or ex partner: No    Emotionally abused: No    Physically abused: No    Forced sexual activity: No  Other Topics Concern  . Not on file  Social History Narrative   Lives at home with husband and son.   Right-handed.   No caffeine use.     PHYSICAL EXAM   Vitals:   09/12/19 1415  BP: 118/71  Pulse: 82  Temp: (!) 97.4 F (36.3 C)  Weight: 173 lb (78.5 kg)  Height: 5\' 6"  (1.676 m)    Not recorded      Body mass index is 27.92 kg/m.  PHYSICAL EXAMNIATION:  Gen: NAD, conversant, well nourised, well groomed                     Cardiovascular: Regular rate rhythm, no peripheral edema, warm, nontender. Eyes: Conjunctivae clear without exudates or hemorrhage Neck: Supple, no carotid bruits. Pulmonary: Clear to auscultation bilaterally   NEUROLOGICAL EXAM:  MENTAL STATUS: Speech:    Speech is normal; fluent and spontaneous with normal comprehension.  Cognition:     Orientation to time, place and person     Normal recent and remote memory     Normal Attention span and concentration     Normal Language, naming, repeating,spontaneous speech     Fund of knowledge   CRANIAL NERVES: CN II: Visual fields are full to confrontation.  Pupils are round equal and briskly reactive to light. CN III, IV, VI: extraocular movement are normal. No ptosis. CN V: Facial sensation is intact to pinprick in all 3 divisions bilaterally. Corneal responses are intact.  CN VII: Face is symmetric with normal eye closure and smile. CN VIII: Hearing is normal to causal conversation. CN IX, X: Palate elevates symmetrically. Phonation is normal. CN XI: Head turning and shoulder shrug are intact CN XII: Tongue is midline  with normal movements and no atrophy.  MOTOR: Normal muscle tone, bulk, strength, there is mild postural tremor, no rigidity, bradykinesia  REFLEXES: Reflexes are 2+ and symmetric at the biceps, triceps, knees, and ankles. Plantar responses are flexor.  SENSORY: Intact to light touch, pinprick and vibratory sensation are intact in fingers and toes.  COORDINATION: There is no trunk or limb dysmetria noted.  GAIT/STANCE: Posture is normal. Gait is steady with normal steps, base, arm swing, and turning. Heel and toe walking are normal. Tandem gait is normal.  Romberg is absent.   DIAGNOSTIC DATA (LABS, IMAGING, TESTING) - I reviewed patient records, labs, notes, testing and imaging myself where available.   ASSESSMENT AND PLAN  EMONNI RANTA is a 38 y.o. female   Intermittent posturing tremor  Differentiation diagnosis include exaggerated physiological tremor, medication side effect, essential tremor  There is no evidence of parkinsonian features,  Patient desires further evaluation including DATscan, order was placed  MRI of the brain and cervical spine showed no significant abnormality  Chronic migraine headache  Much improved with Emgality treatment  Relpax as needed   Marcial Pacas, M.D. Ph.D.  [facility] [address] [place], [place] [address] Ph: [phone] Fax: [phone]  CC: Referring Provider

## 2019-09-24 ENCOUNTER — Encounter: Payer: Self-pay | Admitting: Internal Medicine

## 2019-09-25 NOTE — Telephone Encounter (Signed)
Hi Sharon Hartman,  The patient bill is coming from the vitamin b12 lab and vitamin D lab for DOS 06/27/19.  They are being denied as non covered due to the diagnosis that was associated.  I checked documentation and they were billed correctly with diagnosis L65.9, so I am not able to request that the dx code be changed.    Thanks, Sharyn Lull

## 2019-09-26 ENCOUNTER — Telehealth: Payer: Self-pay | Admitting: Neurology

## 2019-09-26 NOTE — Telephone Encounter (Signed)
Pt is asking for a call to discuss the scheduling of her DAT scan, Pt was advised of last note stating [facility] is waiting on the authorization for the DAT scan

## 2019-10-01 ENCOUNTER — Telehealth: Payer: Self-pay

## 2019-10-01 MED ORDER — AMLODIPINE BESYLATE 5 MG PO TABS: 5.0000 mg | ORAL_TABLET | Freq: Every day | ORAL | 0 refills | Status: DC

## 2019-10-01 NOTE — Telephone Encounter (Signed)
Refill request received via fax from [facility] on [address] for amlodipine.   Last Visit: 07/10/2019 Next Visit: 10/24/2019  Okay to refill per Dr. Estanislado Pandy.

## 2019-10-02 NOTE — Telephone Encounter (Signed)
I called to make the patient aware we were still waiting on approval but she did not answer.

## 2019-10-08 NOTE — Telephone Encounter (Signed)
Patient advised.

## 2019-10-08 NOTE — Telephone Encounter (Signed)
I called patient to tell her her approval just came back from her insurance 10/07/2019 . I will send this information to Eddie at [facility] to schedule Dat scan telephone [phone] - fax [phone]

## 2019-10-17 NOTE — Progress Notes (Signed)
Virtual Visit via Telephone Note  I connected with Sharon Hartman on 10/24/19 at 10:45 AM EST by telephone and verified that I am speaking with the correct person using two identifiers.  Location: Patient: Home  Provider: Clinic  This service was conducted via virtual visit.  The patient was located at home. I was located in my office.  Consent was obtained prior to the virtual visit and is aware of possible charges through their insurance for this visit.  The patient is an established patient.  Dr. Estanislado Pandy, MD conducted the virtual visit and Hazel Sams, PA-C acted as scribe during the service.  Office staff helped with scheduling follow up visits after the service was conducted.   I discussed the limitations, risks, security and privacy concerns of performing an evaluation and management service by telephone and the availability of in person appointments. I also discussed with the patient that there may be a patient responsible charge related to this service. The patient expressed understanding and agreed to proceed.  CC: Raynaud's  History of Present Illness: Sharon Hartman is a 39 y.o. female with history of Raynauds and arthralgias.  She has had less frequent and severe symptoms of Raynaud's since starting on norvasc 5 mg 1 tablet daily.  She denies any digital ulcerations at this time.  Her symptoms of raynaud's in her nipples has mostly resolved.  She continues to have chronic eye dryness and uses eye drops.  She has ongoing lower back pain and is followed by a chiropractor.     Review of Systems  Constitutional: Positive for malaise/fatigue. Negative for fever.  HENT: Negative for congestion.   Eyes: Negative for photophobia, pain, discharge and redness.       +Dry eyes  Respiratory: Negative for cough, shortness of breath and wheezing.   Cardiovascular: Negative for chest pain, palpitations and leg swelling.  Gastrointestinal: Positive for constipation. Negative for blood in stool  and diarrhea.  Genitourinary: Negative for dysuria and frequency.  Musculoskeletal: Positive for joint pain. Negative for back pain, myalgias and neck pain.  Skin: Negative for rash.       +Raynaud's  Neurological: Negative for dizziness, weakness and headaches.  Endo/Heme/Allergies: Does not bruise/bleed easily.  Psychiatric/Behavioral: Negative for depression and memory loss. The patient is not nervous/anxious and does not have insomnia.      Observations/Objective:  Physical Exam  Constitutional: She is oriented to person, place, and time.  Neurological: She is alert and oriented to person, place, and time.  Psychiatric: Mood, memory, affect and judgment normal.   Patient reports morning stiffness for 2 hours.   Patient denies nocturnal pain.  Difficulty dressing/grooming: Denies Difficulty climbing stairs: Denies Difficulty getting out of chair: Denies Difficulty using hands for taps, buttons, cutlery, and/or writing: Reports  Assessment and Plan: Visit Diagnoses: Raynaud's disease without gangrene - History of severe Raynauds.  All autoimmune work-up is negative: Her symptoms of Raynaud's (fingers, toes, and nipples) have been less severe and less frequent since starting to take Norvasc 5 mg 1 tablet by mouth at bedtime. Her blood pressure has been well controlled. She has no digital ulcerations at this time. She was encouraged to keep her core body temperature warm and to wear gloves and socks.  She was advised to notify us if she develops any new or worsening symptoms.  She will follow up in 6 months.   Polyarthralgia - All autoimmune work-up is negative. She experiencing intermittent arthralgias but no joint inflammation.   Sicca syndrome (  [place]) - History of sicca symptoms for many years.  Over-the-counter products were discussed.  All autoimmune work-up is negative.  Other fatigue-She may have a component of myofascial pain syndrome.    Vitamin D deficiency: She is taking  a vitamin D supplement.   Other idiopathic scoliosis, thoracic region-She is followed by a chiropractor.  DDD (degenerative disc disease), lumbar-According to patient she has degenerative disc disease. XRs are not available in Epic. She continues to see a Restaurant manager, fast food.   Essential tremor-She was evaluated by Dr. Krista Blue.  She does not have any evidence of parkinsonian features.  MRI of brain and cervical spine-no significant abnormality. She was referred to a movement disorder specialist at [facility], and she is scheduled for DAT scan.   Other medical conditions are listed as follows:   Orthostatic hypotension  Palpitations  Anxiety and depression  Fatty liver  History of ADHD   Follow Up Instructions: She will follow up in 6 months.    I discussed the assessment and treatment plan with the patient. The patient was provided an opportunity to ask questions and all were answered. The patient agreed with the plan and demonstrated an understanding of the instructions.   The patient was advised to call back or seek an in-person evaluation if the symptoms worsen or if the condition fails to improve as anticipated.  I provided 15 minutes of non-face-to-face time during this encounter.  Bo Merino, MD   Scribed byLovena Le Dale,PA-C

## 2019-10-24 ENCOUNTER — Encounter: Payer: Self-pay | Admitting: Rheumatology

## 2019-10-24 ENCOUNTER — Other Ambulatory Visit: Payer: Self-pay

## 2019-10-24 ENCOUNTER — Telehealth (INDEPENDENT_AMBULATORY_CARE_PROVIDER_SITE_OTHER): Payer: Commercial Managed Care - PPO | Admitting: Rheumatology

## 2019-10-24 DIAGNOSIS — I7300 Raynaud's syndrome without gangrene: Secondary | ICD-10-CM

## 2019-10-24 DIAGNOSIS — M3500 Sicca syndrome, unspecified: Secondary | ICD-10-CM | POA: Diagnosis not present

## 2019-10-24 DIAGNOSIS — I951 Orthostatic hypotension: Secondary | ICD-10-CM

## 2019-10-24 DIAGNOSIS — F329 Major depressive disorder, single episode, unspecified: Secondary | ICD-10-CM

## 2019-10-24 DIAGNOSIS — E559 Vitamin D deficiency, unspecified: Secondary | ICD-10-CM

## 2019-10-24 DIAGNOSIS — G250 Essential tremor: Secondary | ICD-10-CM

## 2019-10-24 DIAGNOSIS — M2550 Pain in unspecified joint: Secondary | ICD-10-CM

## 2019-10-24 DIAGNOSIS — M4124 Other idiopathic scoliosis, thoracic region: Secondary | ICD-10-CM

## 2019-10-24 DIAGNOSIS — F32A Depression, unspecified: Secondary | ICD-10-CM

## 2019-10-24 DIAGNOSIS — Z8659 Personal history of other mental and behavioral disorders: Secondary | ICD-10-CM

## 2019-10-24 DIAGNOSIS — R002 Palpitations: Secondary | ICD-10-CM

## 2019-10-24 DIAGNOSIS — M5136 Other intervertebral disc degeneration, lumbar region: Secondary | ICD-10-CM

## 2019-10-24 DIAGNOSIS — K760 Fatty (change of) liver, not elsewhere classified: Secondary | ICD-10-CM

## 2019-10-24 DIAGNOSIS — R5383 Other fatigue: Secondary | ICD-10-CM | POA: Diagnosis not present

## 2019-10-24 DIAGNOSIS — F419 Anxiety disorder, unspecified: Secondary | ICD-10-CM

## 2019-11-11 ENCOUNTER — Encounter: Payer: Self-pay | Admitting: Neurology

## 2019-11-12 ENCOUNTER — Telehealth: Payer: Self-pay | Admitting: Neurology

## 2019-11-12 NOTE — Telephone Encounter (Signed)
Please reschedule with Dr. Krista Blue, as patient sees her.  Patient is also followed by Dr. Hall Busing and has seen Dr. Carles Collet.

## 2019-11-12 NOTE — Telephone Encounter (Signed)
Patient dismissed from [facility] by Dr. Wells Guiles Tat, effective 11/11/19. Dismissal letter sent out by 1st class mail 11/13/19 [facility]

## 2019-11-13 ENCOUNTER — Telehealth: Payer: Self-pay | Admitting: Neurology

## 2019-11-13 ENCOUNTER — Ambulatory Visit: Payer: Commercial Managed Care - PPO | Admitting: Neurology

## 2019-11-13 NOTE — Telephone Encounter (Signed)
Called pt to reschedule appointment. She did not answer and her voicemail box is full so I was unable to leave a message for her.

## 2019-11-13 NOTE — Telephone Encounter (Signed)
Called patient and discussed her concerns.

## 2019-11-13 NOTE — Telephone Encounter (Signed)
Patient called in requesting a call back from management in regards to her appointment today that was cancelled. She stated she's not interested in seeing Dr. Krista Blue for this issue and was looking forward to seeing Dr. Rexene Alberts since she's a movement specialist. Patient also stated she feels dimissed. She can be reached at [phone].

## 2019-11-14 ENCOUNTER — Other Ambulatory Visit: Payer: Self-pay | Admitting: Obstetrics and Gynecology

## 2019-11-18 ENCOUNTER — Telehealth: Payer: Self-pay

## 2019-11-18 NOTE — Telephone Encounter (Signed)
Patient called office today wanting to speak to the office manager on why she was discharged. Patient transferred her care to [facility] and is also seeing [facility]. Explained to patient that she received the letter because she has transferred her care. Patient stated "I just wanted to make sure that it wasn't because she (Dr. Carles Collet)  missed her diagnosis. 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 I stated to the patient that the statement was not an accurate statement. She stated "ok" and we ended the call.

## 2019-12-09 ENCOUNTER — Encounter: Payer: Self-pay | Admitting: Internal Medicine

## 2019-12-10 ENCOUNTER — Other Ambulatory Visit: Payer: Self-pay | Admitting: Internal Medicine

## 2019-12-10 DIAGNOSIS — R5383 Other fatigue: Secondary | ICD-10-CM

## 2019-12-11 ENCOUNTER — Other Ambulatory Visit: Payer: Self-pay

## 2019-12-11 ENCOUNTER — Ambulatory Visit (INDEPENDENT_AMBULATORY_CARE_PROVIDER_SITE_OTHER): Payer: Commercial Managed Care - PPO

## 2019-12-11 ENCOUNTER — Other Ambulatory Visit (INDEPENDENT_AMBULATORY_CARE_PROVIDER_SITE_OTHER): Payer: Commercial Managed Care - PPO

## 2019-12-11 DIAGNOSIS — R5383 Other fatigue: Secondary | ICD-10-CM | POA: Diagnosis not present

## 2019-12-11 DIAGNOSIS — L659 Nonscarring hair loss, unspecified: Secondary | ICD-10-CM | POA: Diagnosis not present

## 2019-12-11 DIAGNOSIS — Z8349 Family history of other endocrine, nutritional and metabolic diseases: Secondary | ICD-10-CM

## 2019-12-11 DIAGNOSIS — N6452 Nipple discharge: Secondary | ICD-10-CM

## 2019-12-11 LAB — CORTISOL
Cortisol, Plasma: 14.9 ug/dL
Cortisol, Plasma: 23.7 ug/dL
Cortisol, Plasma: 25.2 ug/dL

## 2019-12-11 LAB — T3, FREE: T3, Free: 2.6 pg/mL (ref 2.3–4.2)

## 2019-12-11 LAB — TSH: TSH: 1.36 u[IU]/mL (ref 0.35–4.50)

## 2019-12-11 LAB — T4, FREE: Free T4: 0.72 ng/dL (ref 0.60–1.60)

## 2019-12-11 MED ORDER — COSYNTROPIN 0.25 MG IJ SOLR
0.2500 mg | Freq: Once | INTRAMUSCULAR | Status: AC
  Administered 2019-12-11: 0.25 mg via INTRAMUSCULAR

## 2019-12-11 NOTE — Progress Notes (Signed)
Per orders of Dr. Gherghe injection of Cortrosyn given today by Kaelin Bonelli, Certified Medical Assistant. Patient tolerated injection well.  

## 2019-12-12 LAB — ACTH: ACTH: 47.3 pg/mL (ref 7.2–63.3)

## 2019-12-16 ENCOUNTER — Encounter (HOSPITAL_BASED_OUTPATIENT_CLINIC_OR_DEPARTMENT_OTHER): Payer: Self-pay | Admitting: Obstetrics and Gynecology

## 2019-12-16 ENCOUNTER — Other Ambulatory Visit: Payer: Self-pay

## 2019-12-16 NOTE — Progress Notes (Signed)
Spoke w/ via phone for pre-op interview--- PT Lab needs dos----  CBC, Urine preg             Lab results------ current ekg in epic/ chart COVID test ------ 12-19-2019 @ [id number] Arrive at ------- 1100 NPO after ------  MN w/ exception clear liquids until 0700 then nothing by mouth (no cream/milk products) Medications to take morning of surgery ----- NONE (pt stated did want to take her am meds) Diabetic medication ----- n/a Patient Special Instructions ----- n/a Pre-Op special Istructions ----- n/a Patient verbalized understanding of instructions that were given at this phone interview. Patient denies shortness of breath, chest pain, fever, cough a this phone interview.Marland Kitchen

## 2019-12-19 ENCOUNTER — Other Ambulatory Visit (HOSPITAL_COMMUNITY)
Admission: RE | Admit: 2019-12-19 | Discharge: 2019-12-19 | Disposition: A | Discharge status: Home / Self Care | Payer: Commercial Managed Care - PPO | Source: Ambulatory Visit | Attending: Obstetrics and Gynecology | Admitting: Obstetrics and Gynecology

## 2019-12-19 DIAGNOSIS — Z01812 Encounter for preprocedural laboratory examination: Secondary | ICD-10-CM | POA: Insufficient documentation

## 2019-12-19 DIAGNOSIS — Z20822 Contact with and (suspected) exposure to covid-19: Secondary | ICD-10-CM | POA: Insufficient documentation

## 2019-12-19 LAB — SARS CORONAVIRUS 2 (TAT 6-24 HRS): SARS Coronavirus 2: NEGATIVE

## 2019-12-22 NOTE — Anesthesia Preprocedure Evaluation (Addendum)
Anesthesia Evaluation  Patient identified by MRN, date of birth, ID band Patient awake    Reviewed: Allergy & Precautions, NPO status , Patient's Chart, lab work & pertinent test results  History of Anesthesia Complications (+) PONVNegative for: history of anesthetic complications  Airway Mallampati: II  TM Distance: >3 FB Neck ROM: Full    Dental no notable dental hx.    Pulmonary former smoker,    Pulmonary exam normal        Cardiovascular negative cardio ROS Normal cardiovascular exam     Neuro/Psych  Headaches, Anxiety Depression Bipolar Disorder    GI/Hepatic negative GI ROS, Neg liver ROS,   Endo/Other  negative endocrine ROS  Renal/GU negative Renal ROS  negative genitourinary   Musculoskeletal  (+) Arthritis ,   Abdominal   Peds  Hematology negative hematology ROS (+)   Anesthesia Other Findings Raynaud's dx, on Norvasc  Reproductive/Obstetrics negative OB ROS                            Anesthesia Physical Anesthesia Plan  ASA: II  Anesthesia Plan: General   Post-op Pain Management:    Induction: Intravenous  PONV Risk Score and Plan: 4 or greater and Treatment may vary due to age or medical condition, Ondansetron, Dexamethasone, Midazolam and Scopolamine patch - Pre-op  Airway Management Planned: Oral ETT  Additional Equipment: None  Intra-op Plan:   Post-operative Plan: Extubation in OR  Informed Consent: I have reviewed the patients History and Physical, chart, labs and discussed the procedure including the risks, benefits and alternatives for the proposed anesthesia with the patient or authorized representative who has indicated his/her understanding and acceptance.     Dental advisory given  Plan Discussed with: CRNA  Anesthesia Plan Comments:        Anesthesia Quick Evaluation

## 2019-12-23 ENCOUNTER — Other Ambulatory Visit: Payer: Self-pay

## 2019-12-23 ENCOUNTER — Ambulatory Visit (HOSPITAL_BASED_OUTPATIENT_CLINIC_OR_DEPARTMENT_OTHER): Payer: No Typology Code available for payment source | Admitting: Anesthesiology

## 2019-12-23 ENCOUNTER — Encounter (HOSPITAL_BASED_OUTPATIENT_CLINIC_OR_DEPARTMENT_OTHER): Payer: Self-pay | Admitting: Obstetrics and Gynecology

## 2019-12-23 ENCOUNTER — Encounter (HOSPITAL_BASED_OUTPATIENT_CLINIC_OR_DEPARTMENT_OTHER)
Admission: RE | Disposition: A | Discharge status: Home / Self Care | Payer: Self-pay | Source: Home / Self Care | Attending: Obstetrics and Gynecology

## 2019-12-23 ENCOUNTER — Ambulatory Visit (HOSPITAL_BASED_OUTPATIENT_CLINIC_OR_DEPARTMENT_OTHER)
Admission: RE | Admit: 2019-12-23 | Discharge: 2019-12-23 | Disposition: A | Discharge status: Home / Self Care | Payer: No Typology Code available for payment source | Attending: Obstetrics and Gynecology | Admitting: Obstetrics and Gynecology

## 2019-12-23 DIAGNOSIS — R102 Pelvic and perineal pain: Secondary | ICD-10-CM | POA: Diagnosis not present

## 2019-12-23 DIAGNOSIS — R1031 Right lower quadrant pain: Secondary | ICD-10-CM | POA: Insufficient documentation

## 2019-12-23 DIAGNOSIS — I7300 Raynaud's syndrome without gangrene: Secondary | ICD-10-CM | POA: Diagnosis not present

## 2019-12-23 DIAGNOSIS — Z87891 Personal history of nicotine dependence: Secondary | ICD-10-CM | POA: Diagnosis not present

## 2019-12-23 DIAGNOSIS — M3500 Sicca syndrome, unspecified: Secondary | ICD-10-CM | POA: Insufficient documentation

## 2019-12-23 DIAGNOSIS — Z79899 Other long term (current) drug therapy: Secondary | ICD-10-CM | POA: Insufficient documentation

## 2019-12-23 DIAGNOSIS — K668 Other specified disorders of peritoneum: Secondary | ICD-10-CM | POA: Diagnosis not present

## 2019-12-23 DIAGNOSIS — K5900 Constipation, unspecified: Secondary | ICD-10-CM | POA: Insufficient documentation

## 2019-12-23 DIAGNOSIS — E785 Hyperlipidemia, unspecified: Secondary | ICD-10-CM | POA: Insufficient documentation

## 2019-12-23 DIAGNOSIS — Z85828 Personal history of other malignant neoplasm of skin: Secondary | ICD-10-CM | POA: Insufficient documentation

## 2019-12-23 DIAGNOSIS — Z7982 Long term (current) use of aspirin: Secondary | ICD-10-CM | POA: Insufficient documentation

## 2019-12-23 DIAGNOSIS — F988 Other specified behavioral and emotional disorders with onset usually occurring in childhood and adolescence: Secondary | ICD-10-CM | POA: Diagnosis not present

## 2019-12-23 DIAGNOSIS — N946 Dysmenorrhea, unspecified: Secondary | ICD-10-CM | POA: Insufficient documentation

## 2019-12-23 DIAGNOSIS — F419 Anxiety disorder, unspecified: Secondary | ICD-10-CM | POA: Diagnosis not present

## 2019-12-23 HISTORY — DX: Sjogren syndrome, unspecified: M35.00

## 2019-12-23 HISTORY — DX: Other specified postprocedural states: Z98.890

## 2019-12-23 HISTORY — DX: Other intervertebral disc degeneration, lumbar region: M51.36

## 2019-12-23 HISTORY — DX: Scoliosis, unspecified: M41.9

## 2019-12-23 HISTORY — DX: Other intervertebral disc degeneration, lumbar region without mention of lumbar back pain or lower extremity pain: M51.369

## 2019-12-23 HISTORY — DX: Nausea with vomiting, unspecified: R11.2

## 2019-12-23 HISTORY — PX: LAPAROSCOPIC LYSIS OF ADHESIONS: SHX5905

## 2019-12-23 HISTORY — DX: Right lower quadrant pain: R10.31

## 2019-12-23 HISTORY — DX: Pain in unspecified joint: M25.50

## 2019-12-23 LAB — CBC
HCT: 40.4 % (ref 36.0–46.0)
Hemoglobin: 13.6 g/dL (ref 12.0–15.0)
MCH: 30.4 pg (ref 26.0–34.0)
MCHC: 33.7 g/dL (ref 30.0–36.0)
MCV: 90.2 fL (ref 80.0–100.0)
Platelets: 289 10*3/uL (ref 150–400)
RBC: 4.48 MIL/uL (ref 3.87–5.11)
RDW: 11.8 % (ref 11.5–15.5)
WBC: 6.9 10*3/uL (ref 4.0–10.5)
nRBC: 0 % (ref 0.0–0.2)

## 2019-12-23 LAB — POCT PREGNANCY, URINE: Preg Test, Ur: NEGATIVE

## 2019-12-23 SURGERY — LYSIS, ADHESIONS, LAPAROSCOPIC
Anesthesia: General | Site: Abdomen

## 2019-12-23 MED ORDER — PROPOFOL 10 MG/ML IV BOLUS
INTRAVENOUS | Status: DC | PRN
  Administered 2019-12-23: 50 mg via INTRAVENOUS
  Administered 2019-12-23: 140 mg via INTRAVENOUS

## 2019-12-23 MED ORDER — LIDOCAINE 2% (20 MG/ML) 5 ML SYRINGE
INTRAMUSCULAR | Status: AC
  Filled 2019-12-23: qty 5

## 2019-12-23 MED ORDER — SCOPOLAMINE 1 MG/3DAYS TD PT72
MEDICATED_PATCH | TRANSDERMAL | Status: AC
  Filled 2019-12-23: qty 1

## 2019-12-23 MED ORDER — PROPOFOL 10 MG/ML IV BOLUS
INTRAVENOUS | Status: AC
  Filled 2019-12-23: qty 20

## 2019-12-23 MED ORDER — DEXAMETHASONE SODIUM PHOSPHATE 10 MG/ML IJ SOLN
INTRAMUSCULAR | Status: AC
  Filled 2019-12-23: qty 1

## 2019-12-23 MED ORDER — SCOPOLAMINE 1 MG/3DAYS TD PT72
1.0000 | MEDICATED_PATCH | Freq: Once | TRANSDERMAL | Status: DC
  Administered 2019-12-23: 1.5 mg via TRANSDERMAL
  Filled 2019-12-23: qty 1

## 2019-12-23 MED ORDER — HYDROCODONE-ACETAMINOPHEN 5-325 MG PO TABS: 1.0000 | ORAL_TABLET | Freq: Four times a day (QID) | ORAL | 0 refills | Status: DC | PRN

## 2019-12-23 MED ORDER — DEXAMETHASONE SODIUM PHOSPHATE 4 MG/ML IJ SOLN
INTRAMUSCULAR | Status: DC | PRN
  Administered 2019-12-23: 5 mg via INTRAVENOUS

## 2019-12-23 MED ORDER — BUPIVACAINE HCL (PF) 0.25 % IJ SOLN
INTRAMUSCULAR | Status: DC | PRN
  Administered 2019-12-23: 7 mL

## 2019-12-23 MED ORDER — FENTANYL CITRATE (PF) 100 MCG/2ML IJ SOLN
INTRAMUSCULAR | Status: DC | PRN
  Administered 2019-12-23: 75 ug via INTRAVENOUS
  Administered 2019-12-23 (×2): 50 ug via INTRAVENOUS
  Administered 2019-12-23: 25 ug via INTRAVENOUS
  Administered 2019-12-23: 50 ug via INTRAVENOUS

## 2019-12-23 MED ORDER — ROCURONIUM BROMIDE 100 MG/10ML IV SOLN
INTRAVENOUS | Status: DC | PRN
  Administered 2019-12-23: 40 mg via INTRAVENOUS
  Administered 2019-12-23: 10 mg via INTRAVENOUS

## 2019-12-23 MED ORDER — SUGAMMADEX SODIUM 200 MG/2ML IV SOLN
INTRAVENOUS | Status: DC | PRN
  Administered 2019-12-23: 150 mg via INTRAVENOUS

## 2019-12-23 MED ORDER — DEXMEDETOMIDINE HCL IN NACL 200 MCG/50ML IV SOLN
INTRAVENOUS | Status: DC | PRN
  Administered 2019-12-23: 12 ug via INTRAVENOUS
  Administered 2019-12-23: 8 ug via INTRAVENOUS
  Administered 2019-12-23: 12 ug via INTRAVENOUS

## 2019-12-23 MED ORDER — MIDAZOLAM HCL 5 MG/5ML IJ SOLN
INTRAMUSCULAR | Status: DC | PRN
  Administered 2019-12-23: 2 mg via INTRAVENOUS

## 2019-12-23 MED ORDER — ACETAMINOPHEN 500 MG PO TABS
ORAL_TABLET | ORAL | Status: AC
  Filled 2019-12-23: qty 2

## 2019-12-23 MED ORDER — ONDANSETRON HCL 4 MG/2ML IJ SOLN
INTRAMUSCULAR | Status: AC
  Filled 2019-12-23: qty 2

## 2019-12-23 MED ORDER — MIDAZOLAM HCL 2 MG/2ML IJ SOLN
INTRAMUSCULAR | Status: AC
  Filled 2019-12-23: qty 2

## 2019-12-23 MED ORDER — FENTANYL CITRATE (PF) 100 MCG/2ML IJ SOLN
25.0000 ug | INTRAMUSCULAR | Status: DC | PRN
  Filled 2019-12-23: qty 1

## 2019-12-23 MED ORDER — LIDOCAINE HCL (CARDIAC) PF 100 MG/5ML IV SOSY
PREFILLED_SYRINGE | INTRAVENOUS | Status: DC | PRN
  Administered 2019-12-23: 100 mg via INTRAVENOUS

## 2019-12-23 MED ORDER — ONDANSETRON HCL 4 MG/2ML IJ SOLN
INTRAMUSCULAR | Status: DC | PRN
  Administered 2019-12-23: 4 mg via INTRAVENOUS

## 2019-12-23 MED ORDER — OXYCODONE HCL 5 MG PO TABS
5.0000 mg | ORAL_TABLET | Freq: Once | ORAL | Status: AC | PRN
  Administered 2019-12-23: 5 mg via ORAL
  Filled 2019-12-23: qty 1

## 2019-12-23 MED ORDER — FENTANYL CITRATE (PF) 250 MCG/5ML IJ SOLN
INTRAMUSCULAR | Status: AC
  Filled 2019-12-23: qty 5

## 2019-12-23 MED ORDER — DEXMEDETOMIDINE HCL IN NACL 200 MCG/50ML IV SOLN
INTRAVENOUS | Status: AC
  Filled 2019-12-23: qty 50

## 2019-12-23 MED ORDER — PROMETHAZINE HCL 25 MG/ML IJ SOLN
6.2500 mg | INTRAMUSCULAR | Status: DC | PRN
  Filled 2019-12-23: qty 1

## 2019-12-23 MED ORDER — LACTATED RINGERS IV SOLN
INTRAVENOUS | Status: DC
  Administered 2019-12-23: 1000 mL via INTRAVENOUS
  Filled 2019-12-23: qty 1000

## 2019-12-23 MED ORDER — ACETAMINOPHEN 500 MG PO TABS
1000.0000 mg | ORAL_TABLET | Freq: Once | ORAL | Status: AC
  Administered 2019-12-23: 1000 mg via ORAL
  Filled 2019-12-23: qty 2

## 2019-12-23 MED ORDER — ROCURONIUM BROMIDE 10 MG/ML (PF) SYRINGE
PREFILLED_SYRINGE | INTRAVENOUS | Status: AC
  Filled 2019-12-23: qty 10

## 2019-12-23 MED ORDER — OXYCODONE HCL 5 MG/5ML PO SOLN
5.0000 mg | Freq: Once | ORAL | Status: AC | PRN
  Filled 2019-12-23: qty 5

## 2019-12-23 MED ORDER — WHITE PETROLATUM EX OINT
TOPICAL_OINTMENT | CUTANEOUS | Status: AC
  Filled 2019-12-23: qty 5

## 2019-12-23 MED ORDER — OXYCODONE HCL 5 MG PO TABS
ORAL_TABLET | ORAL | Status: AC
  Filled 2019-12-23: qty 1

## 2019-12-23 MED ORDER — KETOROLAC TROMETHAMINE 30 MG/ML IJ SOLN
INTRAMUSCULAR | Status: DC | PRN
  Administered 2019-12-23: 30 mg via INTRAVENOUS

## 2019-12-23 SURGICAL SUPPLY — 64 items
ADH SKN CLS APL DERMABOND .7 (GAUZE/BANDAGES/DRESSINGS) ×2
APL SRG 38 LTWT LNG FL B (MISCELLANEOUS)
APPLICATOR ARISTA FLEXITIP XL (MISCELLANEOUS) IMPLANT
BARRIER ADHS 3X4 INTERCEED (GAUZE/BANDAGES/DRESSINGS) IMPLANT
BRR ADH 4X3 ABS CNTRL BYND (GAUZE/BANDAGES/DRESSINGS)
CABLE HIGH FREQUENCY MONO STRZ (ELECTRODE) ×2 IMPLANT
CANISTER SUCT 3000ML PPV (MISCELLANEOUS) ×3 IMPLANT
CATH FOLEY 3WAY  5CC 16FR (CATHETERS) ×3
CATH FOLEY 3WAY 5CC 16FR (CATHETERS) ×2 IMPLANT
COVER BACK TABLE 60X90IN (DRAPES) ×3 IMPLANT
COVER TIP SHEARS 8 DVNC (MISCELLANEOUS) ×1 IMPLANT
COVER TIP SHEARS 8MM DA VINCI (MISCELLANEOUS)
DECANTER SPIKE VIAL GLASS SM (MISCELLANEOUS) ×3 IMPLANT
DEFOGGER SCOPE WARMER CLEARIFY (MISCELLANEOUS) ×1 IMPLANT
DERMABOND ADVANCED (GAUZE/BANDAGES/DRESSINGS) ×1
DERMABOND ADVANCED .7 DNX12 (GAUZE/BANDAGES/DRESSINGS) ×2 IMPLANT
DRAPE ARM DVNC X/XI (DISPOSABLE) ×4 IMPLANT
DRAPE COLUMN DVNC XI (DISPOSABLE) ×1 IMPLANT
DRAPE DA VINCI XI ARM (DISPOSABLE)
DRAPE DA VINCI XI COLUMN (DISPOSABLE)
DRSG TELFA 3X8 NADH (GAUZE/BANDAGES/DRESSINGS) ×3 IMPLANT
DURAPREP 26ML APPLICATOR (WOUND CARE) ×3 IMPLANT
ELECT REM PT RETURN 9FT ADLT (ELECTROSURGICAL) ×3
ELECTRODE REM PT RTRN 9FT ADLT (ELECTROSURGICAL) ×2 IMPLANT
GLOVE BIOGEL PI IND STRL 7.0 (GLOVE) ×7 IMPLANT
GLOVE BIOGEL PI INDICATOR 7.0 (GLOVE) ×2
GLOVE ECLIPSE 6.5 STRL STRAW (GLOVE) ×7 IMPLANT
HEMOSTAT ARISTA ABSORB 3G PWDR (HEMOSTASIS) IMPLANT
IRRIG SUCT STRYKERFLOW 2 WTIP (MISCELLANEOUS) ×3
IRRIGATION SUCT STRKRFLW 2 WTP (MISCELLANEOUS) ×2 IMPLANT
LEGGING LITHOTOMY PAIR STRL (DRAPES) ×1 IMPLANT
NEEDLE INSUFFLATION 120MM (ENDOMECHANICALS) ×3 IMPLANT
OBTURATOR OPTICAL STANDARD 8MM (TROCAR) ×3
OBTURATOR OPTICAL STND 8 DVNC (TROCAR) ×2
OBTURATOR OPTICALSTD 8 DVNC (TROCAR) ×2 IMPLANT
OCCLUDER COLPOPNEUMO (BALLOONS) ×1 IMPLANT
PACK ROBOT WH (CUSTOM PROCEDURE TRAY) ×3 IMPLANT
PACK ROBOTIC GOWN (GOWN DISPOSABLE) ×1 IMPLANT
PACK TRENDGUARD 450 HYBRID PRO (MISCELLANEOUS) ×2 IMPLANT
PAD DRESSING TELFA 3X8 NADH (GAUZE/BANDAGES/DRESSINGS) IMPLANT
PAD PREP 24X48 CUFFED NSTRL (MISCELLANEOUS) ×3 IMPLANT
PROTECTOR NERVE ULNAR (MISCELLANEOUS) ×2 IMPLANT
SEAL CANN UNIV 5-8 DVNC XI (MISCELLANEOUS) ×8 IMPLANT
SEAL XI 5MM-8MM UNIVERSAL (MISCELLANEOUS) ×12
SEALER VESSEL DA VINCI XI (MISCELLANEOUS) ×3
SEALER VESSEL EXT DVNC XI (MISCELLANEOUS) ×2 IMPLANT
SET IRRIG Y TYPE TUR BLADDER L (SET/KITS/TRAYS/PACK) IMPLANT
SET TRI-LUMEN FLTR TB AIRSEAL (TUBING) ×3 IMPLANT
SOL ANTI FOG 6CC (MISCELLANEOUS) ×1 IMPLANT
SOLUTION ANTI FOG 6CC (MISCELLANEOUS) ×1
SUT VIC AB 0 CT1 36 (SUTURE) ×4 IMPLANT
SUT VICRYL 4-0 PS2 18IN ABS (SUTURE) ×4 IMPLANT
SUT VLOC 180 0 9IN  GS21 (SUTURE)
SUT VLOC 180 0 9IN GS21 (SUTURE) ×1 IMPLANT
TIP RUMI ORANGE 6.7MMX12CM (TIP) IMPLANT
TIP UTERINE 5.1X6CM LAV DISP (MISCELLANEOUS) IMPLANT
TIP UTERINE 6.7X10CM GRN DISP (MISCELLANEOUS) IMPLANT
TIP UTERINE 6.7X6CM WHT DISP (MISCELLANEOUS) IMPLANT
TIP UTERINE 6.7X8CM BLUE DISP (MISCELLANEOUS) IMPLANT
TOWEL OR 17X26 10 PK STRL BLUE (TOWEL DISPOSABLE) ×4 IMPLANT
TRENDGUARD 450 HYBRID PRO PACK (MISCELLANEOUS) ×3
TROCAR BLADELESS OPT 5 100 (ENDOMECHANICALS) ×2 IMPLANT
TROCAR PORT AIRSEAL 8X120 (TROCAR) ×1 IMPLANT
WATER STERILE IRR 1000ML POUR (IV SOLUTION) ×3 IMPLANT

## 2019-12-23 NOTE — Anesthesia Procedure Notes (Addendum)
Procedure Name: Intubation Date/Time: 12/23/2019 2:02 PM Performed by: Justice Rocher, CRNA Pre-anesthesia Checklist: Patient identified, Emergency Drugs available, Suction available, Patient being monitored and Timeout performed Patient Re-evaluated:Patient Re-evaluated prior to induction Oxygen Delivery Method: Circle system utilized Preoxygenation: Pre-oxygenation with 100% oxygen Induction Type: IV induction Ventilation: Mask ventilation without difficulty Laryngoscope Size: Mac and 3 Grade View: Grade II Tube type: Oral Tube size: 7.0 mm Number of attempts: 1 Airway Equipment and Method: Stylet and Oral airway Placement Confirmation: ETT inserted through vocal cords under direct vision,  positive ETCO2,  breath sounds checked- equal and bilateral and CO2 detector Secured at: 22 cm Tube secured with: Tape Dental Injury: Teeth and Oropharynx as per pre-operative assessment  Comments: Soft gauze airway

## 2019-12-23 NOTE — Transfer of Care (Signed)
Immediate Anesthesia Transfer of Care Note  Patient: Sharon Hartman  Procedure(s) Performed: Diagnostic Laparoscopy/LAPAROSCOPIC Resection of Pelvic Endometriosis (N/A ) Possible XI ROBOTIC ASSISTED LAPAROSCOPIC Resection of Pelvic Endometriosis (N/A )  Patient Location: PACU  Anesthesia Type:General  Level of Consciousness: awake, alert , oriented and patient cooperative  Airway & Oxygen Therapy: Patient Spontanous Breathing and Patient connected to nasal cannula oxygen  Post-op Assessment: Report given to RN and Post -op Vital signs reviewed and stable  Post vital signs: Reviewed and stable  Last Vitals:  Vitals Value Taken Time  BP    Temp    Pulse    Resp    SpO2      Last Pain:  Vitals:   12/23/19 1151  TempSrc: Oral  PainSc: 0-No pain      Patients Stated Pain Goal: 3 ([id number])  Complications: No apparent anesthesia complications

## 2019-12-23 NOTE — H&P (Signed)
Sharon Hartman is an 39 y.o. female. WF here for surgical evaluation to r/o pelvic endometriosis  Pertinent Gynecological History: Menses: flow is excessive with use of 5 pads or tampons on heaviest days Bleeding: dysfunctional uterine bleeding Contraception: none DES exposure: denies Blood transfusions: none Sexually transmitted diseases: no past history Previous GYN Procedures: DNC and LEEP  Last mammogram: n/a Date: n/a Last pap: normal Date: 12/2019 OB History: G2P1011  Menstrual History: Menarche age: n/a Patient's last menstrual period was 12/06/2019.    Past Medical History:  Diagnosis Date  . ADD (attention deficit disorder)   . Anxiety   . Constipation   . DDD (degenerative disc disease), lumbar   . Depression   . Fatty liver   . Galactorrhea    bilateral nipple discharge  . History of basal cell carcinoma (BCC) excision    back 2017 excision  . History of cervical dysplasia    CIN II  s/p LEEP 06/ 2005  . History of suicide attempt    2003; 2006  . History of syncope    02/ 2015  w/ palpitations and pregnant--- evaluation by cardiology, dr Debara Pickett, note in epic 03/ 2015, work-up done w/ normal echo and event monitor showed SR with isolated PVCs/ PACs  . Hyperlipidemia   . Low testosterone   . Low vitamin D level   . Migraine   . Numbness    bilateral hands and feet due to raynaurd's disease  . Polyarthralgia   . PONV (postoperative nausea and vomiting)    also lightweight when it comes to anesthesia  . Raynaud's disease    rheumotology--- dr Estanislado Pandy--  with bilateral hand and feet numbness, and nipples per pt;  takes norvasc  . Right lower quadrant pain   . Scoliosis    thoracic  . Sicca syndrome ([place])   . Tremor, unspecified    intermittant left hand/ wrist tremor with dorsiflexion  ( neurology evaluation by dr tat note in epic 08-15-2019, felt to be clonus)    Past Surgical History:  Procedure Laterality Date  . DILATATION & CURETTAGE/HYSTEROSCOPY  WITH MYOSURE N/A 08/23/2019   Procedure: DILATATION & CURETTAGE/HYSTEROSCOPY WITH MYOSURE;  Surgeon: Servando Salina, MD;  Location: [facility];  Service: Gynecology;  Laterality: N/A;  . DILATION AND CURETTAGE OF UTERUS  1999  . GANGLION CYST EXCISION Right 2011   foot  . LEEP  03-31-2004   @[facility]    CIN II  . TONSILLECTOMY  1998  . WISDOM TOOTH EXTRACTION  1999    Family History  Problem Relation Age of Onset  . Heart murmur Mother   . Diabetes Mother   . Hepatitis Mother        auto immune   . Breast cancer Paternal Grandmother   . Hypertension Paternal Grandmother   . Hyperlipidemia Paternal Grandmother   . Heart disease Paternal Grandmother   . Hypertension Paternal Grandfather   . Heart disease Paternal Grandfather        CABG  . Heart attack Paternal Grandfather        X4  . Hyperlipidemia Paternal Grandfather   . Hypertension Father   . Hyperlipidemia Father   . Hashimoto's thyroiditis Sister   . Depression Sister   . Breast cancer Sister   . Anxiety disorder Sister   . Scoliosis Brother   . Drug abuse Brother   . Autism Son     Social History:  reports that she quit smoking about 15 years ago. Her smoking  use included cigarettes. She quit after 7.00 years of use. She has never used smokeless tobacco. She reports previous alcohol use. She reports that she does not use drugs.  Allergies: No Known Allergies  Medications Prior to Admission  Medication Sig Dispense Refill Last Dose  . amLODipine (NORVASC) 5 MG tablet Take 1 tablet (5 mg total) by mouth at bedtime. (Patient taking differently: Take 5 mg by mouth at bedtime. ) 90 tablet 0 12/22/2019 at Unknown time  . amphetamine-dextroamphetamine (ADDERALL) 10 MG tablet Take 10 mg by mouth daily with breakfast.   Past Week at Unknown time  . aspirin EC 81 MG tablet Take 81 mg by mouth daily.   12/15/2019  . Cholecalciferol 25 MCG (1000 UT) tablet Take by mouth.   12/20/2019  . gabapentin (NEURONTIN) 100  MG capsule Take 100 mg by mouth 3 (three) times daily.     . Galcanezumab-gnlm (EMGALITY) 120 MG/ML SOAJ Inject into the skin every 30 (thirty) days.   12/03/2019  . ibuprofen (ADVIL) 800 MG tablet Take 1 tablet (800 mg total) by mouth every 8 (eight) hours as needed for cramping. 30 tablet 5 12/20/2019  . linaclotide (LINZESS) 72 MCG capsule Take 72 mcg by mouth daily before breakfast.    12/20/2019  . lisdexamfetamine (VYVANSE) 50 MG capsule Take 50 mg by mouth daily.   12/20/2019  . loratadine (CLARITIN) 10 MG tablet Take 10 mg by mouth daily.   12/22/2019 at Unknown time  . LORazepam (ATIVAN) 0.5 MG tablet as needed.     . Melatonin 10 MG TABS Take by mouth at bedtime.    12/22/2019 at Unknown time  . Multiple Vitamins-Minerals (MULTIVITAMIN ADULT PO) Take 1 tablet by mouth daily.    12/22/2019 at Unknown time  . Probiotic Product (PROBIOTIC PO) Take 1 capsule by mouth daily.    12/22/2019 at Unknown time  . Riboflavin 400 MG TABS Take 400 mg by mouth daily.    12/22/2019 at Unknown time  . rosuvastatin (CRESTOR) 5 MG tablet Take 5 mg by mouth at bedtime.    12/22/2019 at Unknown time  . sertraline (ZOLOFT) 100 MG tablet Take 100 mg by mouth daily.   12/22/2019 at Unknown time  . albuterol (VENTOLIN HFA) 108 (90 Base) MCG/ACT inhaler albuterol sulfate HFA 90 mcg/actuation aerosol inhaler   More than a month at Unknown time  . Magnesium 250 MG TABS Take 250 mg by mouth daily.    More than a month at Unknown time  . Wheat Dextrin (BENEFIBER PO) Take 1 Scoop by mouth daily.    More than a month at Unknown time    Review of Systems  All other systems reviewed and are negative.   Blood pressure 114/71, pulse 83, temperature 98.5 F (36.9 C), temperature source Oral, resp. rate 16, height 5\' 6"  (1.676 m), weight 74.2 kg, last menstrual period 12/06/2019, SpO2 100 %. Physical Exam  Constitutional: She is oriented to person, place, and time. She appears well-developed and well-nourished.  HENT:  Head:  Atraumatic.  Eyes: EOM are normal.  Genitourinary:    Vagina and uterus normal.     Genitourinary Comments: Cervix nl Adnexa no palp mass   Musculoskeletal:     Cervical back: Neck supple.  Neurological: She is alert and oriented to person, place, and time.  Skin: Skin is warm and dry.  Psychiatric: She has a normal mood and affect.    Results for orders placed or performed during the hospital encounter of 12/23/19 (  from the past 24 hour(s))  Pregnancy, urine POC     Status: None   Collection Time: 12/23/19 11:31 AM  Result Value Ref Range   Preg Test, Ur NEGATIVE NEGATIVE  CBC     Status: None   Collection Time: 12/23/19 12:13 PM  Result Value Ref Range   WBC 6.9 4.0 - 10.5 K/uL   RBC 4.48 3.87 - 5.11 MIL/uL   Hemoglobin 13.6 12.0 - 15.0 g/dL   HCT 40.4 36.0 - 46.0 %   MCV 90.2 80.0 - 100.0 fL   MCH 30.4 26.0 - 34.0 pg   MCHC 33.7 30.0 - 36.0 g/dL   RDW 11.8 11.5 - 15.5 %   Platelets 289 150 - 400 K/uL   nRBC 0.0 0.0 - 0.2 %    No results found.  Assessment/Plan: RLQ pain Dysmenorrhea P) dx laparoscopy, resection of pelvic endometriosis if present ? davinci Risk of surgery reviewed including infection, bleeding, injury to surrounding organ structures, internal scar tissue, thermal injury all ? answered  Rajanee Schuelke A Ryeleigh Santore 12/23/2019, 1:46 PM

## 2019-12-23 NOTE — Discharge Instructions (Signed)

## 2019-12-23 NOTE — Anesthesia Postprocedure Evaluation (Signed)
Anesthesia Post Note  Patient: Sharon Hartman  Procedure(s) Performed: DIAGNOSTIC LAPAROSCOPY WITH PERITONEAL BIOPSY (N/A Abdomen)     Patient location during evaluation: PACU Anesthesia Type: General Level of consciousness: awake Pain management: pain level controlled Vital Signs Assessment: post-procedure vital signs reviewed and stable Respiratory status: spontaneous breathing Cardiovascular status: stable Postop Assessment: no apparent nausea or vomiting Anesthetic complications: no    Last Vitals:  Vitals:   12/23/19 1545 12/23/19 1600  BP: (!) 98/56 (!) 99/54  Pulse: 72 70  Resp: 12 14  Temp:    SpO2: 97% 94%    Last Pain:  Vitals:   12/23/19 1545  TempSrc:   PainSc: 0-No pain                 Jewelene Mairena

## 2019-12-24 ENCOUNTER — Other Ambulatory Visit: Payer: Self-pay

## 2019-12-24 NOTE — Op Note (Signed)
NAMEDNIYA, OURSLER MEDICAL RECORD [id number] ACCOUNT [id number] DATE OF BIRTH:10/06/1981 FACILITY: [facility] LOCATION: [facility] PHYSICIAN:Shaira Sova A. Kelsy Polack, MD  OPERATIVE REPORT  DATE OF PROCEDURE:  12/23/2019  PREOPERATIVE DIAGNOSIS:  Right lower quadrant pain/pelvic pain.  PROCEDURES:   1.  Diagnostic laparoscopy, Peritoneal biopsy.  POSTOPERATIVE DIAGNOSIS:  Right lower quadrant pain, question pelvic endometriosis.  ANESTHESIA:  General.  SURGEON:  Servando Salina, MD  ASSISTANTS:  Derrell Lolling, CNM.  DESCRIPTION OF PROCEDURE:  Under adequate general anesthesia, the patient was placed in the dorsal lithotomy position.  Examination under anesthesia revealed an anteverted uterus.  No adnexal masses could be appreciated.  The patient was then sterilely  prepped in the usual fashion.  Indwelling Foley catheter was sterilely placed.  Weighted speculum was placed in the vagina.  Sims retractor was placed anteriorly.  The cervix was grasped with a single-tooth tenaculum.  The uterus sounded to 8.5 cm.  An  acorn cannula was introduced into the cervical os and attached to the tenaculum for manipulation of the uterus.  The retractor was then removed.  Attention was then turned to the abdomen.  Marcaine 0.25% was injected supraumbilical.  A transverse  incision was then made.  Veress needle was introduced and tested with sterile saline.  The carbon dioxide was insufflated with an opening pressure of 4.  Three L was insufflated.  Veress needle was then removed.  A robotic trocar was introduced into the  abdomen without incident.  The robotic camera was inserted.  The patient was placed in the Trendelenburg position.  Abdomen was without any incident.  A suprapubic incision was then made.  A 5 mm port was placed under direct visualization.  A probe was  then used to inspect the pelvis and the abdomen.  Normal elongated appendix was noted, normal liver edge, normal anterior cul-de-sac,  normal tubes bilaterally, normal left ovary, posterior cul-de-sac without any obvious lesion.  Under the right ovarian  fossa, there were some small blebs and there was dusting of 2 spots of chocolate-appearing areas superficially on the right ovary.  Putting a  75mm right lower quadrant port under direct visualization, those 2 areas on the ovary were cauterized.  A small  incision was placed in the peritoneum on the right posteriorly and using a scissors, the peritoneum was opened.  Using fluid for hydrodissection, a piece of the peritoneum was used for a biopsy.  Cauterization with the scissors was then done with good  hemostasis noted.  The pressure was decreased to make sure that there was no any evidence of bleeding.  Hemostasis was achieved.  The ports were then removed.  The AirSeal was deflated and then removed and the incisions were closed with 4-0 Vicryl  subcuticular closures and the instruments in the vagina were removed.    SPECIMENS:  Peritoneal biopsy, rule out endometriosis, was sent to pathology.  ESTIMATED BLOOD LOSS:  Minimal.  COMPLICATIONS:  None.  DISPOSITION:  The patient tolerated the procedure well and was transferred to recovery room in stable condition.  VN/NUANCE  D:12/24/2019 T:12/24/2019 JOB:[id number]

## 2019-12-25 LAB — SURGICAL PATHOLOGY

## 2019-12-26 ENCOUNTER — Ambulatory Visit: Payer: Commercial Managed Care - PPO | Admitting: Internal Medicine

## 2019-12-26 ENCOUNTER — Other Ambulatory Visit: Payer: Self-pay | Admitting: *Deleted

## 2019-12-26 ENCOUNTER — Other Ambulatory Visit: Payer: Self-pay

## 2019-12-26 ENCOUNTER — Encounter: Payer: Self-pay | Admitting: Internal Medicine

## 2019-12-26 VITALS — BP 112/60 | HR 96 | Ht 66.0 in | Wt 165.0 lb

## 2019-12-26 DIAGNOSIS — N6452 Nipple discharge: Secondary | ICD-10-CM

## 2019-12-26 DIAGNOSIS — E7800 Pure hypercholesterolemia, unspecified: Secondary | ICD-10-CM

## 2019-12-26 DIAGNOSIS — Z8349 Family history of other endocrine, nutritional and metabolic diseases: Secondary | ICD-10-CM | POA: Diagnosis not present

## 2019-12-26 DIAGNOSIS — E559 Vitamin D deficiency, unspecified: Secondary | ICD-10-CM

## 2019-12-26 DIAGNOSIS — R5383 Other fatigue: Secondary | ICD-10-CM

## 2019-12-26 LAB — VITAMIN D 25 HYDROXY (VIT D DEFICIENCY, FRACTURES): VITD: 48.05 ng/mL (ref 30.00–100.00)

## 2019-12-26 MED ORDER — AMLODIPINE BESYLATE 5 MG PO TABS: 5.0000 mg | ORAL_TABLET | Freq: Every day | ORAL | 0 refills | Status: DC

## 2019-12-26 NOTE — Progress Notes (Signed)
Patient ID: Sharon Hartman, female   DOB: 10/29/1980, 39 y.o.   MRN: [id number]   This visit occurred during the SARS-CoV-2 public health emergency.  Safety protocols were in place, including screening questions prior to the visit, additional usage of staff PPE, and extensive cleaning of exam room while observing appropriate contact time as indicated for disinfecting solutions.   HPI  Sharon Hartman is a 39 y.o.-year-old female, referred by her PCP, Dr. Nancy Fetter, returning for follow-up for family history of thyroiditis, also, fatigue, breast discharge, HL.  Last visit 6 months ago.  Family history of thyroiditis:  Reviewed patient's TFTs: Lab Results  Component Value Date   TSH 1.36 12/11/2019   TSH 1.30 06/27/2019   FREET4 0.72 12/11/2019   FREET4 0.77 06/27/2019  05/30/2019: TSH 1.37, free T3 3.18 (2.5-3.9), total T4 7.2 (4.5-12)   We ruled out Hashimoto's thyroiditis: Component     Latest Ref Rng & Units 06/27/2019  Thyroglobulin Ab     < or = 1 IU/mL <1  Thyroperoxidase Ab SerPl-aCnc     <9 IU/mL <1   Thyroid ultrasound (07/12/2019): Normal, without nodules  Pt denies: - feeling nodules in neck - hoarseness - dysphagia - choking - SOB with lying down  She has + FH of thyroid disorders in: Sister - Hashimoto's hypothyroidism and nodules.  No family history of thyroid cancer. + FH of autoimmune ds. In mother >> died of autoimmune hepatitis at 38 y/o. No h/o radiation tx to head or neck.  No herbal supplements. No Biotin use. No recent steroids use.   Fatigue:  Thyroid tests were normal  On CBC, she has a lymphocytic predominance.  No history of hyponatremia or hyperkalemia:   Chemistry      Component Value Date/Time   NA 145 08/18/2019 0240   K 3.6 08/18/2019 0240   CL 114 (H) 08/18/2019 0240   CO2 20 (L) 08/18/2019 0240   BUN 11 08/18/2019 0240   CREATININE 0.57 08/18/2019 0240      Component Value Date/Time   CALCIUM 9.3 08/18/2019 0240   ALKPHOS 40 06/04/2019  2331   AST 17 06/04/2019 2331   ALT 18 06/04/2019 2331   BILITOT 0.6 06/04/2019 2331     She has a history of low blood pressure and "passing out" 12/08/2019.  We ruled out adrenal insufficiency: Component     Latest Ref Rng & Units 12/11/2019 12/11/2019 12/11/2019         8:16 AM  8:53 AM  9:24 AM  Cortisol, Plasma     ug/dL 14.9 23.7 25.2  ACTH     7.2 - 63.3 pg/mL 47.3     We checked her vitamin levels at last visit and she had a low vitamin D, while B12 vitamin was normal: Lab Results  Component Value Date   VD25OH 22.64 (L) 06/27/2019    Lab Results  Component Value Date   VITAMINB12 472 06/27/2019   We started vitamin D 2000 units daily.  Breast discharge: - investigated by bilateral breast U/S in 10/2017 - Yellow-clear discharge bilaterally.  Of note, she did have 1 episode of bloody discharge from the left breast and conservative follow-up was suggested.  She continues to have occasional light-colored discharge from breast and she describes that even when she was breast-feeding, she had some blood in milk.  She was also diagnosed with ray nods disease of the nipples, but she was able to breast-feed her child with the help of the lactation consultant.  At last visit we discussed that she will probably need to see surgery.  A prolactin level was normal in the past and we recheck this at last visit: Lab Results  Component Value Date   PROLACTIN 7.5 06/27/2019    She has a history of chronic headaches, but no visual problems (she denies blurry or double vision, visual field cuts).  She will have a ductogram.  She does have a history of Raynaud's phenomenon and she is seeing rheumatology.  She also has a history of GERD, ADD, anxiety, depression,  insomnia, gallstones, anemia, low back pain - herniated disc, scoliosis.  HL: Review latest lipid panel: 08/19/2019: 154/176/43/76 05/28/2019: 206/85/54/190 We started Crestor 5 mg daily at last visit  Continues on a vegan  diet: -Breakfast: Coffee with 2 Splenda and granola bar or avocado toast -Lunch: Celery juice and PB + J or vegan soup or spinach salad -Dinner: Veggie pasta or vegan enchiladas or vegan stirfry or salad with veggies -Snacks: Veggies/fruit/turmeric juice and cauliflower crackers, apple and almond butter  No history of prediabetes or diabetes.  Most recent HbA1c was 4.9%.  She has a low testosterone (08/19/2019: 1.3) and is on testosterone cream.  She ruled out for Wilson disease in 11/2019, but three 24-hour urine copper level is undetectable.  She continues to exercise on the elliptical machine. 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  Her son has autism.  ROS: Constitutional: no weight gain/+ weight loss, + fatigue, no subjective hyperthermia, no subjective hypothermia Eyes: no blurry vision, no xerophthalmia ENT: no sore throat, + see HPI Cardiovascular: no CP/no SOB/no palpitations/no leg swelling Respiratory: no cough/no SOB/no wheezing Gastrointestinal: no N/no V/no D/+ chronic C/no acid reflux Musculoskeletal: no muscle aches/no joint aches Skin: no rash, no hair loss Neurological: + tremors/no numbness/no tingling/no dizziness, + headaches  I reviewed pt's medications, allergies, PMH, social hx, family hx, and changes were documented in the history of present illness. Otherwise, unchanged from my initial visit note.  Past Medical History:  Diagnosis Date  . ADD (attention deficit disorder)   . Anxiety   . Constipation   . DDD (degenerative disc disease), lumbar   . Depression   . Fatty liver   . Galactorrhea    bilateral nipple discharge  . History of basal cell carcinoma (BCC) excision    back 2017 excision  . History of cervical dysplasia    CIN II  s/p LEEP 06/ 2005  . History of suicide attempt    2003; 2006  . History of syncope    02/ 2015  w/ palpitations and pregnant--- evaluation by cardiology, dr Debara Pickett, note in epic 03/ 2015, work-up  done w/ normal echo and event monitor showed SR with isolated PVCs/ PACs  . Hyperlipidemia   . Low testosterone   . Low vitamin D level   . Migraine   . Numbness    bilateral hands and feet due to raynaurd's disease  . Polyarthralgia   . PONV (postoperative nausea and vomiting)    also lightweight when it comes to anesthesia  . Raynaud's disease    rheumotology--- dr Estanislado Pandy--  with bilateral hand and feet numbness, and nipples per pt;  takes norvasc  . Right lower quadrant pain   . Scoliosis    thoracic  . Sicca syndrome ([place])   . Tremor, unspecified    intermittant left hand/ wrist tremor with dorsiflexion  ( neurology evaluation by dr tat note in epic 08-15-2019,  felt to be clonus)   Past Surgical History:  Procedure Laterality Date  . DILATATION & CURETTAGE/HYSTEROSCOPY WITH MYOSURE N/A 08/23/2019   Procedure: DILATATION & CURETTAGE/HYSTEROSCOPY WITH MYOSURE;  Surgeon: Servando Salina, MD;  Location: [facility];  Service: Gynecology;  Laterality: N/A;  . DILATION AND CURETTAGE OF UTERUS  1999  . GANGLION CYST EXCISION Right 2011   foot  . LAPAROSCOPIC LYSIS OF ADHESIONS N/A 12/23/2019   Procedure: DIAGNOSTIC LAPAROSCOPY WITH PERITONEAL BIOPSY;  Surgeon: Servando Salina, MD;  Location: [facility];  Service: Gynecology;  Laterality: N/A;  2 1/2 hrs.  Marland Kitchen LEEP  03-31-2004   @[facility]    CIN II  . TONSILLECTOMY  1998  . WISDOM TOOTH EXTRACTION  1999   Social History   Socioeconomic History  . Marital status: Married    Spouse name: Not on file  . Number of children: 1  . Years of education: Not on file  . Highest education level: Not on file  Occupational History  . RN  Social Needs  . Financial resource strain: Not on file  . Food insecurity    Worry: Not on file    Inability: Not on file  . Transportation needs    Medical: Not on file    Non-medical: Not on file  Tobacco Use  . Smoking status: Former Research scientist (life sciences), quit in 2006  .  Smokeless tobacco: Never Used  Substance and Sexual Activity  . Alcohol use: No    Alcohol/week: 1.0 standard drinks    Types: 1 Glasses of wine per week    Comment: socially  . Drug use: No  . Sexual activity: Yes    Birth control/protection: None  Lifestyle  . Physical activity    Days per week: 4 days    Minutes per session: 40 min  . Stress: Rather much   Current Outpatient Medications on File Prior to Visit  Medication Sig Dispense Refill  . albuterol (VENTOLIN HFA) 108 (90 Base) MCG/ACT inhaler albuterol sulfate HFA 90 mcg/actuation aerosol inhaler    . amLODipine (NORVASC) 5 MG tablet Take 1 tablet (5 mg total) by mouth at bedtime. (Patient taking differently: Take 5 mg by mouth at bedtime. ) 90 tablet 0  . amphetamine-dextroamphetamine (ADDERALL) 10 MG tablet Take 10 mg by mouth daily with breakfast.    . aspirin EC 81 MG tablet Take 81 mg by mouth daily.    . Cholecalciferol 25 MCG (1000 UT) tablet Take by mouth.    . gabapentin (NEURONTIN) 100 MG capsule Take 100 mg by mouth 3 (three) times daily.    . Galcanezumab-gnlm (EMGALITY) 120 MG/ML SOAJ Inject into the skin every 30 (thirty) days.    Marland Kitchen HYDROcodone-acetaminophen (NORCO/VICODIN) 5-325 MG tablet Take 1 tablet by mouth every 6 (six) hours as needed for moderate pain. 30 tablet 0  . ibuprofen (ADVIL) 800 MG tablet Take 1 tablet (800 mg total) by mouth every 8 (eight) hours as needed for cramping. 30 tablet 5  . linaclotide (LINZESS) 72 MCG capsule Take 72 mcg by mouth daily before breakfast.     . lisdexamfetamine (VYVANSE) 50 MG capsule Take 50 mg by mouth daily.    Marland Kitchen loratadine (CLARITIN) 10 MG tablet Take 10 mg by mouth daily.    Marland Kitchen LORazepam (ATIVAN) 0.5 MG tablet as needed.    . Magnesium 250 MG TABS Take 250 mg by mouth daily.     . Melatonin 10 MG TABS Take by mouth at bedtime.     Marland Kitchen  Multiple Vitamins-Minerals (MULTIVITAMIN ADULT PO) Take 1 tablet by mouth daily.     . Probiotic Product (PROBIOTIC PO) Take 1  capsule by mouth daily.     . Riboflavin 400 MG TABS Take 400 mg by mouth daily.     . rosuvastatin (CRESTOR) 5 MG tablet Take 5 mg by mouth at bedtime.     . sertraline (ZOLOFT) 100 MG tablet Take 100 mg by mouth daily.    . Wheat Dextrin (BENEFIBER PO) Take 1 Scoop by mouth daily.      No current facility-administered medications on file prior to visit.   No Known Allergies Family History  Problem Relation Age of Onset  . Heart murmur Mother   . Diabetes Mother   . Hepatitis Mother        auto immune   . Breast cancer Paternal Grandmother   . Hypertension Paternal Grandmother   . Hyperlipidemia Paternal Grandmother   . Heart disease Paternal Grandmother   . Hypertension Paternal Grandfather   . Heart disease Paternal Grandfather        CABG  . Heart attack Paternal Grandfather        X4  . Hyperlipidemia Paternal Grandfather   . Hypertension Father   . Hyperlipidemia Father   . Hashimoto's thyroiditis Sister   . Depression Sister   . Breast cancer Sister   . Anxiety disorder Sister   . Scoliosis Brother   . Drug abuse Brother   . Autism Son     PE: BP 112/60   Pulse 96   Ht 5\' 6"  (1.676 m)   Wt 165 lb (74.8 kg)   LMP 12/06/2019   SpO2 98%   BMI 26.63 kg/m  Wt Readings from Last 3 Encounters:  12/26/19 165 lb (74.8 kg)  12/23/19 163 lb 8 oz (74.2 kg)  09/12/19 173 lb (78.5 kg)   Constitutional: overweight, in NAD Eyes: PERRLA, EOMI, no exophthalmos ENT: moist mucous membranes, no thyromegaly, no cervical lymphadenopathy Cardiovascular: tachycardia,RR, No MRG Respiratory: CTA B Gastrointestinal: abdomen soft, NT, ND, BS+ Musculoskeletal: no deformities, strength intact in all 4 Skin: moist, warm, no rashes Neurological: no tremor with outstretched hands, DTR normal in all 4  ASSESSMENT: 1.  Family history of thyroiditis  2. Fatigue  3.  Breast discharge  4.  Hypercholesterolemia  5. HL  PLAN: 1. FH of Hashimoto's thyroiditis -She has a family  history of thyroiditis in her sister, for whom the thyroid antibodies were negative but on ultrasound she had thyroid nodules and changes suggestive of thyroiditis.  At last visit we checked her TFTs and these were normal, as were her antithyroid antibodies. -Thyroid ultrasound was also normal -No further investigation is needed for this  2.  Fatigue -No evidence of endocrine etiology.  No signs of adrenal insufficiency, hypothyroidism, hypopituitarism -She had previous investigation by PCP and rheumatology.  She had a predominance of lymphocytes on CBC and we discussed in the past about the possibility of a chronic viral infection. -However, at last visit: Her vitamin D level was low only started vitamin D 2000 units daily.  We will recheck this today.  Otherwise, she will follow-up with PCP.  3.  Breast discharge - longstanding, bilateral, mild -A prolactin level was normal in the past and this was repeated at last visit with normal results. -She had a breast ultrasound performed in the past and this was normal.  However, at last visit, I suggested that she see surgery. -She has an upcoming breast  ductogram ordered by OB/GYN  4.  Hypercholesterolemia - Reviewed latest lipid panel from 05/2019: LDL very high -She also has a history of fatty liver and a recent investigation for Wilson's disease was negative -At last visit, I suggested Crestor 5 mg daily.  She started this. -Continues the statin without side effects. -She continues a vegan diet -Reviewed latest lipid panel by PCP and this was drastically improved.  We will continue Crestor.  Going forward, I suggested that she follows up with PCP.  She agrees.  -Total time spent for the visit: >40 minutes, obtaining information about her symptoms, reviewing her previous labs, evaluations, and treatments, counseling her about her conditions (please see the discussed topics above), and developing a plan to further investigate and treat them;  she had a number of questions which I addressed.  Component     Latest Ref Rng & Units 12/26/2019          VITD     30.00 - 100.00 ng/mL 48.05    Philemon Kingdom, MD PhD [facility] Endocrinology

## 2019-12-26 NOTE — Patient Instructions (Signed)
Please stop at the lab.  Continue: - Rosuvastatin 5 mg daily - Vitamin D 2000 units daily  Please come back as needed.

## 2019-12-26 NOTE — Telephone Encounter (Signed)
Refill request received via fax  Last Visit: 10/24/19 Next visit: 04/08/20  Okay to refill per Dr. Estanislado Pandy

## 2020-03-23 ENCOUNTER — Encounter: Payer: Self-pay | Admitting: Rheumatology

## 2020-03-23 NOTE — Telephone Encounter (Signed)
I could not reach the patient.  Please advise her that she can get [facility] labs as she is having a flare right now.  All autoimmune work-up in the past has been negative.  We will be happy to refer her to a tertiary care center of her choice for evaluation if she would like.

## 2020-03-25 ENCOUNTER — Other Ambulatory Visit: Payer: Self-pay | Admitting: *Deleted

## 2020-03-25 MED ORDER — AMLODIPINE BESYLATE 5 MG PO TABS: 5.0000 mg | ORAL_TABLET | Freq: Every day | ORAL | 0 refills | Status: DC

## 2020-03-25 NOTE — Telephone Encounter (Signed)
Refill request received via fax  Last Visit: 10/24/19 Next visit: 04/08/20  Current Dose per office note on 10/24/2019: Norvasc 5 mg 1 tablet by mouth at bedtime.  Okay to refill per Dr. Estanislado Pandy

## 2020-03-25 NOTE — Progress Notes (Deleted)
Office Visit Note  Patient: Sharon Hartman             Date of Birth: 1981/01/13           MRN: [id number]             PCP: Donald Prose, MD Referring: Donald Prose, MD Visit Date: 04/08/2020 Occupation: @GUAROCC @  Subjective:  No chief complaint on file.   History of Present Illness: Sharon Hartman is a 39 y.o. female ***   Activities of Daily Living:  Patient reports morning stiffness for *** {minute/hour:19697}.   Patient {ACTIONS;DENIES/REPORTS:21021675::"Denies"} nocturnal pain.  Difficulty dressing/grooming: {ACTIONS;DENIES/REPORTS:21021675::"Denies"} Difficulty climbing stairs: {ACTIONS;DENIES/REPORTS:21021675::"Denies"} Difficulty getting out of chair: {ACTIONS;DENIES/REPORTS:21021675::"Denies"} Difficulty using hands for taps, buttons, cutlery, and/or writing: {ACTIONS;DENIES/REPORTS:21021675::"Denies"}  No Rheumatology ROS completed.   PMFS History:  Patient Active Problem List   Diagnosis Date Noted  . Tremor 09/12/2019  . Chronic migraine 09/12/2019  . Postpartum care following vaginal delivery (9/9) 06/18/2014  . Post-dates pregnancy 06/17/2014  . Palpitations 11/22/2013  . Syncope 11/22/2013  . Orthostatic hypotension 11/22/2013  . ADHD (attention deficit hyperactivity disorder) 12/04/2011    Past Medical History:  Diagnosis Date  . ADD (attention deficit disorder)   . Anxiety   . Constipation   . DDD (degenerative disc disease), lumbar   . Depression   . Fatty liver   . Galactorrhea    bilateral nipple discharge  . History of basal cell carcinoma (BCC) excision    back 2017 excision  . History of cervical dysplasia    CIN II  s/p LEEP 06/ 2005  . History of suicide attempt    2003; 2006  . History of syncope    02/ 2015  w/ palpitations and pregnant--- evaluation by cardiology, dr Debara Pickett, note in epic 03/ 2015, work-up done w/ normal echo and event monitor showed SR with isolated PVCs/ PACs  . Hyperlipidemia   . Low testosterone   . Low  vitamin D level   . Migraine   . Numbness    bilateral hands and feet due to raynaurd's disease  . Polyarthralgia   . PONV (postoperative nausea and vomiting)    also lightweight when it comes to anesthesia  . Raynaud's disease    rheumotology--- dr Estanislado Pandy--  with bilateral hand and feet numbness, and nipples per pt;  takes norvasc  . Right lower quadrant pain   . Scoliosis    thoracic  . Sicca syndrome ([place])   . Tremor, unspecified    intermittant left hand/ wrist tremor with dorsiflexion  ( neurology evaluation by dr tat note in epic 08-15-2019, felt to be clonus)    Family History  Problem Relation Age of Onset  . Heart murmur Mother   . Diabetes Mother   . Hepatitis Mother        auto immune   . Breast cancer Paternal Grandmother   . Hypertension Paternal Grandmother   . Hyperlipidemia Paternal Grandmother   . Heart disease Paternal Grandmother   . Hypertension Paternal Grandfather   . Heart disease Paternal Grandfather        CABG  . Heart attack Paternal Grandfather        X4  . Hyperlipidemia Paternal Grandfather   . Hypertension Father   . Hyperlipidemia Father   . Hashimoto's thyroiditis Sister   . Depression Sister   . Breast cancer Sister   . Anxiety disorder Sister   . Scoliosis Brother   . Drug abuse Brother   .  Autism Son    Past Surgical History:  Procedure Laterality Date  . DILATATION & CURETTAGE/HYSTEROSCOPY WITH MYOSURE N/A 08/23/2019   Procedure: DILATATION & CURETTAGE/HYSTEROSCOPY WITH MYOSURE;  Surgeon: Servando Salina, MD;  Location: [facility];  Service: Gynecology;  Laterality: N/A;  . DILATION AND CURETTAGE OF UTERUS  1999  . GANGLION CYST EXCISION Right 2011   foot  . LAPAROSCOPIC LYSIS OF ADHESIONS N/A 12/23/2019   Procedure: DIAGNOSTIC LAPAROSCOPY WITH PERITONEAL BIOPSY;  Surgeon: Servando Salina, MD;  Location: [facility];  Service: Gynecology;  Laterality: N/A;  2 1/2 hrs.  Marland Kitchen LEEP  03-31-2004    @[facility]    CIN II  . TONSILLECTOMY  1998  . WISDOM TOOTH EXTRACTION  1999   Social History   Social History Narrative   Lives at home with husband and son.   Right-handed.   No caffeine use.   Immunization History  Administered Date(s) Administered  . PPD Test 02/13/2012  . Tdap 02/13/2012     Objective: Vital Signs: There were no vitals taken for this visit.   Physical Exam   Musculoskeletal Exam: ***  CDAI Exam: CDAI Score: -- Patient Global: --; Provider Global: -- Swollen: --; Tender: -- Joint Exam 04/08/2020   No joint exam has been documented for this visit   There is currently no information documented on the homunculus. Go to the Rheumatology activity and complete the homunculus joint exam.  Investigation: No additional findings.  Imaging: No results found.  Recent Labs: Lab Results  Component Value Date   WBC 6.9 12/23/2019   HGB 13.6 12/23/2019   PLT 289 12/23/2019   NA 145 08/18/2019   K 3.6 08/18/2019   CL 114 (H) 08/18/2019   CO2 20 (L) 08/18/2019   GLUCOSE 118 (H) 08/18/2019   BUN 11 08/18/2019   CREATININE 0.57 08/18/2019   BILITOT 0.6 06/04/2019   ALKPHOS 40 06/04/2019   AST 17 06/04/2019   ALT 18 06/04/2019   PROT 7.6 06/04/2019   ALBUMIN 4.4 06/04/2019   CALCIUM 9.3 08/18/2019   GFRAA >60 08/18/2019    Speciality Comments: No specialty comments available.  Procedures:  No procedures performed Allergies: Patient has no known allergies.   Assessment / Plan:     Visit Diagnoses: No diagnosis found.  Orders: No orders of the defined types were placed in this encounter.  No orders of the defined types were placed in this encounter.   Face-to-face time spent with patient was *** minutes. Greater than 50% of time was spent in counseling and coordination of care.  Follow-Up Instructions: No follow-ups on file.   Earnestine Mealing, CMA  Note - This record has been created using Editor, commissioning.  Chart creation errors have been  sought, but may not always  have been located. Such creation errors do not reflect on  the standard of medical care.

## 2020-04-08 ENCOUNTER — Ambulatory Visit: Payer: Commercial Managed Care - PPO | Admitting: Rheumatology

## 2020-04-14 NOTE — Progress Notes (Signed)
Office Visit Note  Patient: Sharon Hartman             Date of Birth: [birth year]           MRN: [id number]             PCP: Donald Prose, MD Referring: Donald Prose, MD Visit Date: 04/27/2020 Occupation: @GUAROCC @  Subjective:  Facial rash   History of Present Illness: Sharon Hartman is a 39 y.o. female with history of Raynaud's, sicca symptoms, and DDD.  Patient presents today to discuss the worsening symptoms she has been experiencing.  She continues to have intermittent symptoms of Raynaud's in her fingers and toes, facial rash, rash on her hands, photosensitivity, hair loss, sicca symptoms, and flulike symptoms.  She experiences myalgias and occasional arthralgias but denies any joint swelling.  She experiences right trochanter bursitis and occasional nocturnal pain while lying on her right side.  She denies any sores in her mouth or nose.  She states that at times it feels as though she has the flu.  She takes over-the-counter products as needed for symptomatic relief.  Activities of Daily Living:  Patient reports morning stiffness for 30-60  minutes.   Patient Reports nocturnal pain.  Difficulty dressing/grooming: Denies Difficulty climbing stairs: Denies Difficulty getting out of chair: Denies Difficulty using hands for taps, buttons, cutlery, and/or writing: Denies  Review of Systems  Constitutional: Positive for fatigue.  HENT: Negative for mouth sores, mouth dryness and nose dryness.   Eyes: Negative for itching and dryness.  Respiratory: Negative for shortness of breath and difficulty breathing.   Cardiovascular: Negative for chest pain and palpitations.  Gastrointestinal: Positive for constipation. Negative for blood in stool and diarrhea.  Endocrine: Negative for increased urination.  Genitourinary: Negative for difficulty urinating.  Musculoskeletal: Positive for arthralgias, joint pain, myalgias, morning stiffness, muscle tenderness and myalgias. Negative for joint  swelling.  Skin: Positive for color change, rash and hair loss.  Allergic/Immunologic: Negative for susceptible to infections.  Neurological: Positive for dizziness, numbness and weakness. Negative for headaches and memory loss.  Hematological: Positive for bruising/bleeding tendency.  Psychiatric/Behavioral: Negative for confusion.    PMFS History:  Patient Active Problem List   Diagnosis Date Noted  . Tremor 09/12/2019  . Chronic migraine 09/12/2019  . Postpartum care following vaginal delivery (9/9) 06/18/2014  . Post-dates pregnancy 06/17/2014  . Palpitations 11/22/2013  . Syncope 11/22/2013  . Orthostatic hypotension 11/22/2013  . ADHD (attention deficit hyperactivity disorder) 12/04/2011    Past Medical History:  Diagnosis Date  . ADD (attention deficit disorder)   . Anxiety   . Constipation   . DDD (degenerative disc disease), lumbar   . Depression   . Fatty liver   . Galactorrhea    bilateral nipple discharge  . History of basal cell carcinoma (BCC) excision    back 2017 excision  . History of cervical dysplasia    CIN II  s/p LEEP 06/ 2005  . History of suicide attempt    2003; 2006  . History of syncope    02/ 2015  w/ palpitations and pregnant--- evaluation by cardiology, dr Debara Pickett, note in epic 03/ 2015, work-up done w/ normal echo and event monitor showed SR with isolated PVCs/ PACs  . Hyperlipidemia   . Low testosterone   . Low vitamin D level   . Migraine   . Numbness    bilateral hands and feet due to raynaurd's disease  . Polyarthralgia   . PONV (postoperative  nausea and vomiting)    also lightweight when it comes to anesthesia  . Raynaud's disease    rheumotology--- dr Estanislado Pandy--  with bilateral hand and feet numbness, and nipples per pt;  takes norvasc  . Right lower quadrant pain   . Scoliosis    thoracic  . Sicca syndrome ([place])   . Tremor, unspecified    intermittant left hand/ wrist tremor with dorsiflexion  ( neurology evaluation by dr tat  note in epic 08-15-2019, felt to be clonus)    Family History  Problem Relation Age of Onset  . Heart murmur Mother   . Diabetes Mother   . Hepatitis Mother        auto immune   . Breast cancer Paternal Grandmother   . Hypertension Paternal Grandmother   . Hyperlipidemia Paternal Grandmother   . Heart disease Paternal Grandmother   . Hypertension Paternal Grandfather   . Heart disease Paternal Grandfather        CABG  . Heart attack Paternal Grandfather        X4  . Hyperlipidemia Paternal Grandfather   . Hypertension Father   . Hyperlipidemia Father   . Hashimoto's thyroiditis Sister   . Depression Sister   . Breast cancer Sister   . Anxiety disorder Sister   . Scoliosis Brother   . Drug abuse Brother   . Autism Son    Past Surgical History:  Procedure Laterality Date  . DILATATION & CURETTAGE/HYSTEROSCOPY WITH MYOSURE N/A 08/23/2019   Procedure: DILATATION & CURETTAGE/HYSTEROSCOPY WITH MYOSURE;  Surgeon: Servando Salina, MD;  Location: [facility];  Service: Gynecology;  Laterality: N/A;  . DILATION AND CURETTAGE OF UTERUS  1999  . GANGLION CYST EXCISION Right 2011   foot  . LAPAROSCOPIC LYSIS OF ADHESIONS N/A 12/23/2019   Procedure: DIAGNOSTIC LAPAROSCOPY WITH PERITONEAL BIOPSY;  Surgeon: Servando Salina, MD;  Location: [facility];  Service: Gynecology;  Laterality: N/A;  2 1/2 hrs.  Marland Kitchen LEEP  03-31-2004   @[facility]    CIN II  . TONSILLECTOMY  1998  . WISDOM TOOTH EXTRACTION  1999   Social History   Social History Narrative   Lives at home with husband and son.   Right-handed.   No caffeine use.   Immunization History  Administered Date(s) Administered  . PPD Test 02/13/2012  . Tdap 02/13/2012     Objective: Vital Signs: BP 106/72 (BP Location: Left Arm, Patient Position: Sitting, Cuff Size: Normal)   Pulse 94   Resp 14   Ht 5\' 6"  (1.676 m)   Wt 160 lb (72.6 kg)   BMI 25.82 kg/m    Physical Exam Vitals and nursing note  reviewed.  Constitutional:      Appearance: She is well-developed.  HENT:     Head: Normocephalic and atraumatic.  Eyes:     Conjunctiva/sclera: Conjunctivae normal.  Pulmonary:     Effort: Pulmonary effort is normal.  Abdominal:     General: Bowel sounds are normal.     Palpations: Abdomen is soft.  Musculoskeletal:     Cervical back: Normal range of motion.  Lymphadenopathy:     Cervical: No cervical adenopathy.  Skin:    General: Skin is warm and dry.     Capillary Refill: Capillary refill takes less than 2 seconds.  Neurological:     Mental Status: She is alert and oriented to person, place, and time.  Psychiatric:        Behavior: Behavior normal.  Musculoskeletal Exam: C-spine, thoracic spine, lumbar spine have good range of motion.  Shoulder joints, elbow joints, wrist joints, MCPs, PIPs, DIPs have good range of motion with no synovitis.  She has complete fist formation bilaterally.  Hip joints have good range of motion with no discomfort.  She has tenderness over the right trochanteric bursa.  Knee joints, ankle joints, MTPs, PIPs, and DIPs have good range of motion with no synovitis.  No warmth or effusion of bilateral knee joints.  No tenderness or swelling of ankle joints.  CDAI Exam: CDAI Score: -- Patient Global: --; Provider Global: -- Swollen: --; Tender: -- Joint Exam 04/27/2020   No joint exam has been documented for this visit   There is currently no information documented on the homunculus. Go to the Rheumatology activity and complete the homunculus joint exam.  Investigation: No additional findings.  Imaging: No results found.  Recent Labs: Lab Results  Component Value Date   WBC 6.9 12/23/2019   HGB 13.6 12/23/2019   PLT 289 12/23/2019   NA 145 08/18/2019   K 3.6 08/18/2019   CL 114 (H) 08/18/2019   CO2 20 (L) 08/18/2019   GLUCOSE 118 (H) 08/18/2019   BUN 11 08/18/2019   CREATININE 0.57 08/18/2019   BILITOT 0.6 06/04/2019   ALKPHOS 40  06/04/2019   AST 17 06/04/2019   ALT 18 06/04/2019   PROT 7.6 06/04/2019   ALBUMIN 4.4 06/04/2019   CALCIUM 9.3 08/18/2019   GFRAA >60 08/18/2019    Speciality Comments: No specialty comments available.  Procedures:  No procedures performed Allergies: Patient has no known allergies.   Assessment / Plan:     Visit Diagnoses: Raynaud's disease without gangrene - History of severe Raynauds.  All autoimmune work-up is negative in the past: She experiences intermittent symptoms of Raynaud's.  No digital ulcerations or signs of gangrene were noted on exam.  She has good capillary refill.  She continues to take aspirin 81 mg 1 tablet by mouth daily.  She would like to recheck the lupus anticoagulant today.  Beta-2 glycoprotein antibodies and anticardiolipin antibodies were negative on 06/25/2019. She has also been experiencing intermittent facial rashes and rashes on the dorsal aspect of both hands.  She was evaluated by her PCP who prescribed a prednisone taper which resolved the facial rash.  She is also noticed increased hair loss and ongoing photosensitivity.  She avoids direct sun exposure and wears SPF 50 on a daily basis.  She has not had any recent oral or nasal ulcerations.  She continues to have chronic sicca symptoms and uses over-the-counter products.  She experiences myalgias and flulike symptoms every 2 to 3 months lasting about 1 week.  She has no synovitis on exam today.  Due to the new and worsening symptoms she has been experiencing we will obtain [facility] labs.  She will follow up in 1 month to discuss lab work. - Plan: Lupus Anticoagulant Eval w/Reflex  Sicca syndrome ([place]): She has ongoing sicca symptoms and uses over-the-counter products for symptomatic relief. [facility] lab work will be obtained.   Other fatigue: She has chronic fatigue and intermittent flulike symptoms.  We will obtain [facility] lab work for further evaluation.   Vitamin D deficiency: Vitamin D was 48.05 on 12/26/2019.    Other idiopathic scoliosis, thoracic region: She experiences intermittent discomfort.  DDD (degenerative disc disease), lumbar: She has good range of motion with no discomfort.  No symptoms of radiculopathy.  Other medical conditions are listed as follows:  Essential tremor  Orthostatic hypotension  Palpitations  Anxiety and depression  History of ADHD  Fatty liver  Orders: Orders Placed This Encounter  Procedures  . Lupus Anticoagulant Eval w/Reflex   No orders of the defined types were placed in this encounter.   Face-to-face time spent with patient was 30 minutes. Greater than 50% of time was spent in counseling and coordination of care.  Follow-Up Instructions: Return in about 5 months (around 09/27/2020) for Raynaud's , DDD.   Ofilia Neas, PA-C  Note - This record has been created using Dragon software.  Chart creation errors have been sought, but may not always  have been located. Such creation errors do not reflect on  the standard of medical care.

## 2020-04-27 ENCOUNTER — Ambulatory Visit (INDEPENDENT_AMBULATORY_CARE_PROVIDER_SITE_OTHER): Payer: No Typology Code available for payment source | Admitting: Physician Assistant

## 2020-04-27 ENCOUNTER — Encounter: Payer: Self-pay | Admitting: Physician Assistant

## 2020-04-27 ENCOUNTER — Other Ambulatory Visit: Payer: Self-pay

## 2020-04-27 VITALS — BP 106/72 | HR 94 | Resp 14 | Ht 66.0 in | Wt 160.0 lb

## 2020-04-27 DIAGNOSIS — E559 Vitamin D deficiency, unspecified: Secondary | ICD-10-CM

## 2020-04-27 DIAGNOSIS — G250 Essential tremor: Secondary | ICD-10-CM

## 2020-04-27 DIAGNOSIS — M3500 Sicca syndrome, unspecified: Secondary | ICD-10-CM

## 2020-04-27 DIAGNOSIS — I7300 Raynaud's syndrome without gangrene: Secondary | ICD-10-CM | POA: Diagnosis not present

## 2020-04-27 DIAGNOSIS — R5383 Other fatigue: Secondary | ICD-10-CM

## 2020-04-27 DIAGNOSIS — F329 Major depressive disorder, single episode, unspecified: Secondary | ICD-10-CM

## 2020-04-27 DIAGNOSIS — F419 Anxiety disorder, unspecified: Secondary | ICD-10-CM

## 2020-04-27 DIAGNOSIS — M4124 Other idiopathic scoliosis, thoracic region: Secondary | ICD-10-CM

## 2020-04-27 DIAGNOSIS — K760 Fatty (change of) liver, not elsewhere classified: Secondary | ICD-10-CM

## 2020-04-27 DIAGNOSIS — Z8659 Personal history of other mental and behavioral disorders: Secondary | ICD-10-CM

## 2020-04-27 DIAGNOSIS — F32A Depression, unspecified: Secondary | ICD-10-CM

## 2020-04-27 DIAGNOSIS — R002 Palpitations: Secondary | ICD-10-CM

## 2020-04-27 DIAGNOSIS — I951 Orthostatic hypotension: Secondary | ICD-10-CM

## 2020-04-27 DIAGNOSIS — M5136 Other intervertebral disc degeneration, lumbar region: Secondary | ICD-10-CM

## 2020-04-27 NOTE — Patient Instructions (Signed)
Hip Bursitis Rehab Ask your health care provider which exercises are safe for you. Do exercises exactly as told by your health care provider and adjust them as directed. It is normal to feel mild stretching, pulling, tightness, or discomfort as you do these exercises. Stop right away if you feel sudden pain or your pain gets worse. Do not begin these exercises until told by your health care provider. Stretching exercise This exercise warms up your muscles and joints and improves the movement and flexibility of your hip. This exercise also helps to relieve pain and stiffness. Iliotibial band stretch An iliotibial band is a strong band of muscle tissue that runs from the outer side of your hip to the outer side of your thigh and knee. 1. Lie on your side with your left / right leg in the top position. 2. Bend your left / right knee and grab your ankle. Stretch out your bottom arm to help you balance. 3. Slowly bring your knee back so your thigh is behind your body. 4. Slowly lower your knee toward the floor until you feel a gentle stretch on the outside of your left / right thigh. If you do not feel a stretch and your knee will not fall farther, place the heel of your other foot on top of your knee and pull your knee down toward the floor with your foot. 5. Hold this position for __________ seconds. 6. Slowly return to the starting position. Repeat __________ times. Complete this exercise __________ times a day. Strengthening exercises These exercises build strength and endurance in your hip and pelvis. Endurance is the ability to use your muscles for a long time, even after they get tired. Bridge This exercise strengthens the muscles that move your thigh backward (hip extensors). 1. Lie on your back on a firm surface with your knees bent and your feet flat on the floor. 2. Tighten your buttocks muscles and lift your buttocks off the floor until your trunk is level with your thighs. ? Do not arch  your back. ? You should feel the muscles working in your buttocks and the back of your thighs. If you do not feel these muscles, slide your feet 1-2 inches (2.5-5 cm) farther away from your buttocks. ? If this exercise is too easy, try doing it with your arms crossed over your chest. 3. Hold this position for __________ seconds. 4. Slowly lower your hips to the starting position. 5. Let your muscles relax completely after each repetition. Repeat __________ times. Complete this exercise __________ times a day. Squats This exercise strengthens the muscles in front of your thigh and knee (quadriceps). 1. Stand in front of a table, with your feet and knees pointing straight ahead. You may rest your hands on the table for balance but not for support. 2. Slowly bend your knees and lower your hips like you are going to sit in a chair. ? Keep your weight over your heels, not over your toes. ? Keep your lower legs upright so they are parallel with the table legs. ? Do not let your hips go lower than your knees. ? Do not bend lower than told by your health care provider. ? If your hip pain increases, do not bend as low. 3. Hold the squat position for __________ seconds. 4. Slowly push with your legs to return to standing. Do not use your hands to pull yourself to standing. Repeat __________ times. Complete this exercise __________ times a day. Hip hike 1. Stand   sideways on a bottom step. Stand on your left / right leg with your other foot unsupported next to the step. You can hold on to the railing or wall for balance if needed. 2. Keep your knees straight and your torso square. Then lift your left / right hip up toward the ceiling. 3. Hold this position for __________ seconds. 4. Slowly let your left / right hip lower toward the floor, past the starting position. Your foot should get closer to the floor. Do not lean or bend your knees. Repeat __________ times. Complete this exercise __________ times a  day. Single leg stand 1. Without shoes, stand near a railing or in a doorway. You may hold on to the railing or door frame as needed for balance. 2. Squeeze your left / right buttock muscles, then lift up your other foot. ? Do not let your left / right hip push out to the side. ? It is helpful to stand in front of a mirror for this exercise so you can watch your hip. 3. Hold this position for __________ seconds. Repeat __________ times. Complete this exercise __________ times a day. This information is not intended to replace advice given to you by your health care provider. Make sure you discuss any questions you have with your health care provider. Document Revised: 01/21/2019 Document Reviewed: 01/21/2019 Elsevier Patient Education  2020 [employer]  

## 2020-04-29 LAB — LUPUS ANTICOAGULANT EVAL W/ REFLEX
PT, Mixing Interp: POSITIVE — AB
PTT-LA Screen: 32 s (ref <=40)
dRVVT: 55 s — ABNORMAL HIGH (ref <=45)

## 2020-04-29 LAB — RFLX DRVVT CONFRIM: DRVVT CONFIRM: POSITIVE — AB

## 2020-04-29 LAB — RFX DRVVT 1:1 MIX

## 2020-04-30 ENCOUNTER — Encounter: Payer: Self-pay | Admitting: Rheumatology

## 2020-04-30 NOTE — Progress Notes (Signed)
Lupus anticoagulant is positive.  She is currently taking aspirin 81 mg daily.  Dr. Estanislado Pandy would like the Pt to return to discuss starting on Plaquenil.

## 2020-05-01 NOTE — Telephone Encounter (Signed)
Called patient and discussed lab results. Patient scheduled for an appointment on 05/06/2020 to discuss PLQ.

## 2020-05-04 NOTE — Progress Notes (Signed)
Office Visit Note  Patient: Sharon Hartman             Date of Birth: 12-23-1980           MRN: [id number]             PCP: Donald Prose, MD Referring: Donald Prose, MD Visit Date: 05/06/2020 Occupation: @GUAROCC @  Subjective:  Discuss medication options   History of Present Illness: Sharon Hartman is a 38 y.o. female with history of Raynaud's and sicca symptoms.  She presents today to discuss recent lab work and medication options.  She is aware that her lupus anticoagulant was detected and she is concerned because she is taking an OCP for abnormal uterine bleeding.  She continues to have intermittent symptoms of Raynaud's in her fingers and toes.  She takes Norvasc 5 mg by mouth daily and aspirin 81 mg by mouth daily.  She also develops intermittent facial rashes and rashes on her hands.  She has ongoing photosensitivity and tries to avoid direct sun exposure.  Patient experiences days where she experiences severe arthralgias, myalgias, and fatigue and feels as though she has the flu.  She states that today is 1 of those days.  She has been taking ibuprofen as needed for symptomatic relief but is concerned about the amount she is having to take to manage her symptoms.    Activities of Daily Living:  Patient reports morning stiffness for 1 hour.   Patient Denies nocturnal pain.  Difficulty dressing/grooming: Denies Difficulty climbing stairs: Denies Difficulty getting out of chair: Denies Difficulty using hands for taps, buttons, cutlery, and/or writing: Denies  Review of Systems  Constitutional: Positive for fatigue.  HENT: Negative for mouth sores, mouth dryness and nose dryness.   Eyes: Negative for pain, visual disturbance and dryness.  Respiratory: Negative for cough, hemoptysis, shortness of breath and difficulty breathing.   Cardiovascular: Negative for chest pain, palpitations, hypertension and swelling in legs/feet.  Gastrointestinal: Positive for constipation. Negative for  blood in stool and diarrhea.  Endocrine: Negative for increased urination.  Genitourinary: Negative for difficulty urinating and painful urination.  Musculoskeletal: Positive for arthralgias, joint pain, morning stiffness and muscle tenderness. Negative for joint swelling, myalgias, muscle weakness and myalgias.  Skin: Positive for rash. Negative for color change, pallor, hair loss, nodules/bumps, skin tightness, ulcers and sensitivity to sunlight.  Allergic/Immunologic: Negative for susceptible to infections.  Neurological: Positive for numbness and weakness. Negative for dizziness and headaches.  Hematological: Negative for bruising/bleeding tendency and swollen glands.  Psychiatric/Behavioral: Positive for sleep disturbance. Negative for depressed mood. The patient is not nervous/anxious.     PMFS History:  Patient Active Problem List   Diagnosis Date Noted  . Tremor 09/12/2019  . Chronic migraine 09/12/2019  . Postpartum care following vaginal delivery (9/9) 06/18/2014  . Post-dates pregnancy 06/17/2014  . Palpitations 11/22/2013  . Syncope 11/22/2013  . Orthostatic hypotension 11/22/2013  . ADHD (attention deficit hyperactivity disorder) 12/04/2011    Past Medical History:  Diagnosis Date  . ADD (attention deficit disorder)   . Anxiety   . Constipation   . DDD (degenerative disc disease), lumbar   . Depression   . Fatty liver   . Galactorrhea    bilateral nipple discharge  . History of basal cell carcinoma (BCC) excision    back 2017 excision  . History of cervical dysplasia    CIN II  s/p LEEP 06/ 2005  . History of suicide attempt    2003; 2006  .  History of syncope    02/ 2015  w/ palpitations and pregnant--- evaluation by cardiology, dr Debara Pickett, note in epic 03/ 2015, work-up done w/ normal echo and event monitor showed SR with isolated PVCs/ PACs  . Hyperlipidemia   . Low testosterone   . Low vitamin D level   . Migraine   . Numbness    bilateral hands and feet  due to raynaurd's disease  . Polyarthralgia   . PONV (postoperative nausea and vomiting)    also lightweight when it comes to anesthesia  . Raynaud's disease    rheumotology--- dr Estanislado Pandy--  with bilateral hand and feet numbness, and nipples per pt;  takes norvasc  . Right lower quadrant pain   . Scoliosis    thoracic  . Sicca syndrome ([place])   . Tremor, unspecified    intermittant left hand/ wrist tremor with dorsiflexion  ( neurology evaluation by dr tat note in epic 08-15-2019, felt to be clonus)    Family History  Problem Relation Age of Onset  . Heart murmur Mother   . Diabetes Mother   . Hepatitis Mother        auto immune   . Breast cancer Paternal Grandmother   . Hypertension Paternal Grandmother   . Hyperlipidemia Paternal Grandmother   . Heart disease Paternal Grandmother   . Hypertension Paternal Grandfather   . Heart disease Paternal Grandfather        CABG  . Heart attack Paternal Grandfather        X4  . Hyperlipidemia Paternal Grandfather   . Hypertension Father   . Hyperlipidemia Father   . Hashimoto's thyroiditis Sister   . Depression Sister   . Breast cancer Sister   . Anxiety disorder Sister   . Scoliosis Brother   . Drug abuse Brother   . Autism Son    Past Surgical History:  Procedure Laterality Date  . DILATATION & CURETTAGE/HYSTEROSCOPY WITH MYOSURE N/A 08/23/2019   Procedure: DILATATION & CURETTAGE/HYSTEROSCOPY WITH MYOSURE;  Surgeon: Servando Salina, MD;  Location: [facility];  Service: Gynecology;  Laterality: N/A;  . DILATION AND CURETTAGE OF UTERUS  1999  . GANGLION CYST EXCISION Right 2011   foot  . LAPAROSCOPIC LYSIS OF ADHESIONS N/A 12/23/2019   Procedure: DIAGNOSTIC LAPAROSCOPY WITH PERITONEAL BIOPSY;  Surgeon: Servando Salina, MD;  Location: [facility];  Service: Gynecology;  Laterality: N/A;  2 1/2 hrs.  Marland Kitchen LEEP  03-31-2004   @[facility]    CIN II  . TONSILLECTOMY  1998  . WISDOM TOOTH EXTRACTION  1999     Social History   Social History Narrative   Lives at home with husband and son.   Right-handed.   No caffeine use.   Immunization History  Administered Date(s) Administered  . PPD Test 02/13/2012  . Tdap 02/13/2012     Objective: Vital Signs: BP 114/73 (BP Location: Left Arm, Patient Position: Sitting, Cuff Size: Normal)   Pulse 95   Resp 14   Ht 5\' 6"  (1.676 m)   Wt 158 lb 12.8 oz (72 kg)   BMI 25.63 kg/m    Physical Exam Vitals and nursing note reviewed.  Constitutional:      Appearance: She is well-developed.  HENT:     Head: Normocephalic and atraumatic.  Eyes:     Conjunctiva/sclera: Conjunctivae normal.  Pulmonary:     Effort: Pulmonary effort is normal.  Abdominal:     General: Bowel sounds are normal.     Palpations:  Abdomen is soft.  Musculoskeletal:     Cervical back: Normal range of motion.  Lymphadenopathy:     Cervical: No cervical adenopathy.  Skin:    General: Skin is warm and dry.     Capillary Refill: Capillary refill takes less than 2 seconds.  Neurological:     Mental Status: She is alert and oriented to person, place, and time.  Psychiatric:        Behavior: Behavior normal.      Musculoskeletal Exam: C-spine, thoracic spine, and lumbar spine good ROM.  Painful ROM of lumbar spine.  Shoulder joints, elbow joints, wrist joints, MCPs, PIPs, and DIPs good ROM with no synovitis.  Complete fist formation bilaterally.  Hip joints, knee joints, ankle joints, MTPs, PIPs, and DIPs good ROM with no discomfort.  Tenderness over the right trochanteric bursa.  No warmth or effusion of knee joints noted.  No tenderness or swelling of ankle joints.  PIP and DIP thickening consistent with osteoarthritis of both feet.   CDAI Exam: CDAI Score: -- Patient Global: --; Provider Global: -- Swollen: --; Tender: -- Joint Exam 05/06/2020   No joint exam has been documented for this visit   There is currently no information documented on the homunculus. Go to  the Rheumatology activity and complete the homunculus joint exam.  Investigation: No additional findings.  Imaging: No results found.  Recent Labs: Lab Results  Component Value Date   WBC 6.9 12/23/2019   HGB 13.6 12/23/2019   PLT 289 12/23/2019   NA 145 08/18/2019   K 3.6 08/18/2019   CL 114 (H) 08/18/2019   CO2 20 (L) 08/18/2019   GLUCOSE 118 (H) 08/18/2019   BUN 11 08/18/2019   CREATININE 0.57 08/18/2019   BILITOT 0.6 06/04/2019   ALKPHOS 40 06/04/2019   AST 17 06/04/2019   ALT 18 06/04/2019   PROT 7.6 06/04/2019   ALBUMIN 4.4 06/04/2019   CALCIUM 9.3 08/18/2019   GFRAA >60 08/18/2019    Speciality Comments: No specialty comments available.  Procedures:  Large Joint Inj: R greater trochanter on 05/06/2020 9:01 AM Indications: pain Details: 27 G 1.5 in needle, lateral approach  Arthrogram: No  Medications: 40 mg triamcinolone acetonide 40 MG/ML; 1.5 mL lidocaine 1 % Aspirate: 0 mL Outcome: tolerated well, no immediate complications Procedure, treatment alternatives, risks and benefits explained, specific risks discussed. Consent was given by the patient. Immediately prior to procedure a time out was called to verify the correct patient, procedure, equipment, support staff and site/side marked as required. Patient was prepped and draped in the usual sterile fashion.     Allergies: Patient has no known allergies.   Assessment / Plan:     Visit Diagnoses: Autoimmune disease ([place]): Lupus anticoagulant detected on 04/27/2020.  [facility] labs pending: She has been experiencing several new and worsening symptoms over the past several months.  She has had intermittent arthralgias, myalgias, fatigue, facial rashes, intermittent symptoms of Raynaud's, sicca symptoms, photosensitivity, and increased hair loss.  Today she is experiencing significant lower back pain and discomfort due to trochanteric bursitis of the right hip.  No synovitis was noted on exam.  We discussed that a  lupus anticoagulant was detected on 04/26/2020.  She is currently taking aspirin 81 mg by mouth daily but is concerned due to being on low-dose OCP for abnormal uterine bleeding.  She presented today to discuss starting on Plaquenil.  Indications, contraindications, potential side effects of Plaquenil were discussed today.  All questions were addressed and  consent was obtained.  She will start on Plaquenil 200 mg 1 tablet by mouth twice daily Monday through Friday pending lab results.  CBC, CMP, and G6PD were ordered today.  She is aware that she will return for lab work in 1 month in 3 months and every 5 months to monitor for drug toxicity.  She will also require baseline and yearly Plaquenil eye exam.  She voiced understanding.  She was encouraged to still have [facility] labs drawn to complete the workup.   She was advised to notify us if she cannot tolerate taking PLQ.   She will follow up in 6 weeks to assess her response.   Patient was counseled on the purpose, proper use, and adverse effects of hydroxychloroquine including nausea/diarrhea, skin rash, headaches, and sun sensitivity.  Discussed importance of annual eye exams while on hydroxychloroquine to monitor to ocular toxicity and discussed importance of frequent laboratory monitoring.  Provided patient with eye exam form for baseline ophthalmologic exam.  Provided patient with educational materials on hydroxychloroquine and answered all questions.  Patient consented to hydroxychloroquine.  Will upload consent in the media tab.    Dose will be Plaquenil 200 mg twice daily Monday through Friday.  Prescription pending lab results.  High risk medication use -Plaquenil 200 mg 1 tablet by mouth twice daily M-F only.  CBC, CMP, and G6PD were ordered today prior to sending in the prescription for PLQ.  She will require updated lab work 1 month, 3 months, then every 5 months after starting on PLQ.  She will also require a baseline and yearly PLQ eye exam. Plan:  CBC with Differential/Platelet, COMPLETE METABOLIC PANEL WITH GFR, Glucose 6 phosphate dehydrogenase  Raynaud's disease without gangrene - History of severe Raynauds.  All autoimmune work-up is negative in the past.  She continues to experience intermittent symptoms of Raynaud's.  No digital ulcerations or signs of gangrene were noted on exam.  She is taking Norvasc 5 mg by mouth daily and aspirin 81 mg by mouth daily.  She will also be starting on Plaquenil as discussed above.  Sicca syndrome ([place]): She has chronic sicca symptoms which have been tolerable overall.  Her sicca symptoms are likely exacerbated by taking Adderall and Vyvanse.  Other fatigue: Chronic.  She is experiencing significant fatigue today.  She has been having bouts of severe fatigue, arthralgias, and myalgias and feels that at times is that she has the flu.  Vitamin D deficiency: Her vitamin D was 48.05 on 12/26/2019.  Trochanteric bursitis of right hip: She has tenderness to palpation over the right trochanter bursa.  She is experiencing severe discomfort.  Her discomfort has started to change her gait which is exacerbating her lower back pain.  We discussed trying a cortisone injection today.  After informed consent the right trochanter bursa was injected.  The procedure note was completed above.  Aftercare was discussed.  She was encouraged to perform daily stretching exercises.  She was advised to notify us if her symptoms persist or worsen.  Other idiopathic scoliosis, thoracic region  DDD (degenerative disc disease), lumbar  Other medical conditions are listed as follows:   Anxiety and depression  Orthostatic hypotension  Essential tremor  Palpitations  History of ADHD  Fatty liver  Orders: Orders Placed This Encounter  Procedures  . Large Joint Inj  . CBC with Differential/Platelet  . COMPLETE METABOLIC PANEL WITH GFR  . Glucose 6 phosphate dehydrogenase   No orders of the defined types were placed in  this encounter.   Face-to-face time spent with patient was 30 minutes. Greater than 50% of time was spent in counseling and coordination of care.  Follow-Up Instructions: Return in 6 weeks (on 06/17/2020) for Raynaud's syndrome.   Ofilia Neas, PA-C  Note - This record has been created using Dragon software.  Chart creation errors have been sought, but may not always  have been located. Such creation errors do not reflect on  the standard of medical care.

## 2020-05-06 ENCOUNTER — Ambulatory Visit (INDEPENDENT_AMBULATORY_CARE_PROVIDER_SITE_OTHER): Payer: No Typology Code available for payment source | Admitting: Physician Assistant

## 2020-05-06 ENCOUNTER — Other Ambulatory Visit: Payer: Self-pay

## 2020-05-06 ENCOUNTER — Encounter: Payer: Self-pay | Admitting: Physician Assistant

## 2020-05-06 ENCOUNTER — Telehealth: Payer: Self-pay

## 2020-05-06 VITALS — BP 114/73 | HR 95 | Resp 14 | Ht 66.0 in | Wt 158.8 lb

## 2020-05-06 DIAGNOSIS — Z79899 Other long term (current) drug therapy: Secondary | ICD-10-CM

## 2020-05-06 DIAGNOSIS — M359 Systemic involvement of connective tissue, unspecified: Secondary | ICD-10-CM

## 2020-05-06 DIAGNOSIS — M3500 Sicca syndrome, unspecified: Secondary | ICD-10-CM | POA: Diagnosis not present

## 2020-05-06 DIAGNOSIS — I951 Orthostatic hypotension: Secondary | ICD-10-CM

## 2020-05-06 DIAGNOSIS — I7300 Raynaud's syndrome without gangrene: Secondary | ICD-10-CM

## 2020-05-06 DIAGNOSIS — M7061 Trochanteric bursitis, right hip: Secondary | ICD-10-CM | POA: Diagnosis not present

## 2020-05-06 DIAGNOSIS — R002 Palpitations: Secondary | ICD-10-CM

## 2020-05-06 DIAGNOSIS — M4124 Other idiopathic scoliosis, thoracic region: Secondary | ICD-10-CM

## 2020-05-06 DIAGNOSIS — K760 Fatty (change of) liver, not elsewhere classified: Secondary | ICD-10-CM

## 2020-05-06 DIAGNOSIS — M5136 Other intervertebral disc degeneration, lumbar region: Secondary | ICD-10-CM

## 2020-05-06 DIAGNOSIS — R5383 Other fatigue: Secondary | ICD-10-CM

## 2020-05-06 DIAGNOSIS — G250 Essential tremor: Secondary | ICD-10-CM

## 2020-05-06 DIAGNOSIS — F329 Major depressive disorder, single episode, unspecified: Secondary | ICD-10-CM

## 2020-05-06 DIAGNOSIS — Z8659 Personal history of other mental and behavioral disorders: Secondary | ICD-10-CM

## 2020-05-06 DIAGNOSIS — F419 Anxiety disorder, unspecified: Secondary | ICD-10-CM

## 2020-05-06 DIAGNOSIS — E559 Vitamin D deficiency, unspecified: Secondary | ICD-10-CM

## 2020-05-06 DIAGNOSIS — F32A Depression, unspecified: Secondary | ICD-10-CM

## 2020-05-06 MED ORDER — LIDOCAINE HCL 1 % IJ SOLN
1.5000 mL | INTRAMUSCULAR | Status: AC | PRN
  Administered 2020-05-06: 1.5 mL

## 2020-05-06 MED ORDER — TRIAMCINOLONE ACETONIDE 40 MG/ML IJ SUSP
40.0000 mg | INTRAMUSCULAR | Status: AC | PRN
  Administered 2020-05-06: 40 mg via INTRA_ARTICULAR

## 2020-05-06 NOTE — Telephone Encounter (Signed)
Pending lab results, patient will be starting plaquenil per Hazel Sams, PA-C. Consent obtained and sent to the scan center.

## 2020-05-06 NOTE — Patient Instructions (Signed)
Standing Labs We placed an order today for your standing lab work.   Please have your standing labs drawn in 1 month, 3 months, then every 5 months   If possible, please have your labs drawn 2 weeks prior to your appointment so that the provider can discuss your results at your appointment.  We have open lab daily Monday through Thursday from 8:30-12:30 PM and 1:30-4:30 PM and Friday from 8:30-12:30 PM and 1:30-4:00 PM at the office of Dr. Shaili Deveshwar, [facility].   Please be advised, patients with office appointments requiring lab work will take precedents over walk-in lab work.  If possible, please come for your lab work on Monday and Friday afternoons, as you may experience shorter wait times. The office is located at [address], [address], [place], [place] [address] No appointment is necessary.   Labs are drawn by [facility]. Please bring your co-pay at the time of your lab draw.  You may receive a bill from [facility] for your lab work.  If you wish to have your labs drawn at another location, please call the office 24 hours in advance to send orders.  If you have any questions regarding directions or hours of operation,  please call [phone].   As a reminder, please drink plenty of water prior to coming for your lab work. Thanks!   Hydroxychloroquine tablets What is this medicine? HYDROXYCHLOROQUINE (hye drox ee KLOR oh kwin) is used to treat rheumatoid arthritis and systemic lupus erythematosus. It is also used to treat malaria. This medicine may be used for other purposes; ask your health care provider or pharmacist if you have questions. COMMON BRAND NAME(S): Plaquenil, Quineprox What should I tell my health care provider before I take this medicine? They need to know if you have any of these conditions:  diabetes  eye disease, vision problems  G6PD deficiency  heart disease  history of irregular heartbeat  if you often drink alcohol  kidney  disease  liver disease  porphyria  psoriasis  an unusual or allergic reaction to chloroquine, hydroxychloroquine, other medicines, foods, dyes, or preservatives  pregnant or trying to get pregnant  breast-feeding How should I use this medicine? Take this medicine by mouth with a glass of water. Follow the directions on the prescription label. Do not cut, crush or chew this medicine. Swallow the tablets whole. Take this medicine with food. Avoid taking antacids within 4 hours of taking this medicine. It is best to separate these medicines by at least 4 hours. Take your medicine at regular intervals. Do not take it more often than directed. Take all of your medicine as directed even if you think you are better. Do not skip doses or stop your medicine early. Talk to your pediatrician regarding the use of this medicine in children. While this drug may be prescribed for selected conditions, precautions do apply. Overdosage: If you think you have taken too much of this medicine contact a poison control center or emergency room at once. NOTE: This medicine is only for you. Do not share this medicine with others. What if I miss a dose? If you miss a dose, take it as soon as you can. If it is almost time for your next dose, take only that dose. Do not take double or extra doses. What may interact with this medicine? Do not take this medicine with any of the following medications:  cisapride  dronedarone  pimozide  thioridazine This medicine may also interact with the   following medications:  ampicillin  antacids  cimetidine  cyclosporine  digoxin  kaolin  medicines for diabetes, like insulin, glipizide, glyburide  medicines for seizures like carbamazepine, phenobarbital, phenytoin  mefloquine  methotrexate  other medicines that prolong the QT interval (cause an abnormal heart rhythm)  praziquantel This list may not describe all possible interactions. Give your health care  provider a list of all the medicines, herbs, non-prescription drugs, or dietary supplements you use. Also tell them if you smoke, drink alcohol, or use illegal drugs. Some items may interact with your medicine. What should I watch for while using this medicine? Visit your health care professional for regular checks on your progress. Tell your health care professional if your symptoms do not start to get better or if they get worse. You may need blood work done while you are taking this medicine. If you take other medicines that can affect heart rhythm, you may need more testing. Talk to your health care professional if you have questions. Your vision may be tested before and during use of this medicine. Tell your health care professional right away if you have any change in your eyesight. What side effects may I notice from receiving this medicine? Side effects that you should report to your doctor or health care professional as soon as possible:  allergic reactions like skin rash, itching or hives, swelling of the face, lips, or tongue  changes in vision  decreased hearing or ringing of the ears  muscle weakness  redness, blistering, peeling or loosening of the skin, including inside the mouth  sensitivity to light  signs and symptoms of a dangerous change in heartbeat or heart rhythm like chest pain; dizziness; fast or irregular heartbeat; palpitations; feeling faint or lightheaded, falls; breathing problems  signs and symptoms of liver injury like dark yellow or brown urine; general ill feeling or flu-like symptoms; light-colored stools; loss of appetite; nausea; right upper belly pain; unusually weak or tired; yellowing of the eyes or skin  signs and symptoms of low blood sugar such as feeling anxious; confusion; dizziness; increased hunger; unusually weak or tired; sweating; shakiness; cold; irritable; headache; blurred vision; fast heartbeat; loss of consciousness  suicidal  thoughts  uncontrollable head, mouth, neck, arm, or leg movements Side effects that usually do not require medical attention (report to your doctor or health care professional if they continue or are bothersome):  diarrhea  dizziness  hair loss  headache  irritable  loss of appetite  nausea, vomiting  stomach pain This list may not describe all possible side effects. Call your doctor for medical advice about side effects. You may report side effects to FDA at 1-800-FDA-1088. Where should I keep my medicine? Keep out of the reach of children. Store at room temperature between 15 and 30 degrees C (59 and 86 degrees F). Protect from moisture and light. Throw away any unused medicine after the expiration date. NOTE: This sheet is a summary. It may not cover all possible information. If you have questions about this medicine, talk to your doctor, pharmacist, or health care provider.  2020 Elsevier/Gold Standard (2019-02-04 12:56:32)  

## 2020-05-07 ENCOUNTER — Telehealth: Payer: Self-pay | Admitting: Hematology

## 2020-05-07 LAB — CBC WITH DIFFERENTIAL/PLATELET
Absolute Monocytes: 361 {cells}/uL (ref 200–950)
Basophils Absolute: 42 {cells}/uL (ref 0–200)
Basophils Relative: 0.5 %
Eosinophils Absolute: 101 {cells}/uL (ref 15–500)
Eosinophils Relative: 1.2 %
HCT: 42.1 % (ref 35.0–45.0)
Hemoglobin: 13.8 g/dL (ref 11.7–15.5)
Lymphs Abs: 3688 {cells}/uL (ref 850–3900)
MCH: 30.3 pg (ref 27.0–33.0)
MCHC: 32.8 g/dL (ref 32.0–36.0)
MCV: 92.5 fL (ref 80.0–100.0)
MPV: 10.3 fL (ref 7.5–12.5)
Monocytes Relative: 4.3 %
Neutro Abs: 4208 {cells}/uL (ref 1500–7800)
Neutrophils Relative %: 50.1 %
Platelets: 351 Thousand/uL (ref 140–400)
RBC: 4.55 Million/uL (ref 3.80–5.10)
RDW: 11.7 % (ref 11.0–15.0)
Total Lymphocyte: 43.9 %
WBC: 8.4 Thousand/uL (ref 3.8–10.8)

## 2020-05-07 LAB — COMPLETE METABOLIC PANEL WITHOUT GFR
AG Ratio: 2 (calc) (ref 1.0–2.5)
ALT: 15 U/L (ref 6–29)
AST: 13 U/L (ref 10–30)
Albumin: 4.5 g/dL (ref 3.6–5.1)
Alkaline phosphatase (APISO): 36 U/L (ref 31–125)
BUN: 15 mg/dL (ref 7–25)
CO2: 25 mmol/L (ref 20–32)
Calcium: 9.6 mg/dL (ref 8.6–10.2)
Chloride: 105 mmol/L (ref 98–110)
Creat: 0.66 mg/dL (ref 0.50–1.10)
GFR, Est African American: 130 mL/min/1.73m2
GFR, Est Non African American: 112 mL/min/1.73m2
Globulin: 2.2 g/dL (ref 1.9–3.7)
Glucose, Bld: 92 mg/dL (ref 65–99)
Potassium: 4 mmol/L (ref 3.5–5.3)
Sodium: 139 mmol/L (ref 135–146)
Total Bilirubin: 0.2 mg/dL (ref 0.2–1.2)
Total Protein: 6.7 g/dL (ref 6.1–8.1)

## 2020-05-07 LAB — GLUCOSE 6 PHOSPHATE DEHYDROGENASE: G-6PDH: 16.1 U/g Hgb (ref 7.0–20.5)

## 2020-05-07 MED ORDER — HYDROXYCHLOROQUINE SULFATE 200 MG PO TABS: ORAL_TABLET | ORAL | 0 refills | Status: DC

## 2020-05-07 NOTE — Telephone Encounter (Signed)
Received a new hem referral from [facility] for positive lupus anticoagulant. Sharon Hartman has been cld and scheduled to see Dr. Irene Limbo on 8/11 at 11am. Sharon Hartman aware to arrive 15 minutes early.

## 2020-05-07 NOTE — Progress Notes (Signed)
CBC and CMP WNL

## 2020-05-07 NOTE — Telephone Encounter (Signed)
Per office note on 05/06/2020: She will start on Plaquenil 200 mg 1 tablet by mouth twice daily Monday through Friday   Labs: CBC and CMP WNL. G6PD pending.

## 2020-05-08 NOTE — Progress Notes (Signed)
Labs are within normal limits.  Please send prescription for Plaquenil 200 mg p.o. twice daily Monday through Friday.

## 2020-05-14 ENCOUNTER — Encounter: Payer: Self-pay | Admitting: Rheumatology

## 2020-05-20 ENCOUNTER — Inpatient Hospital Stay: Payer: No Typology Code available for payment source | Attending: Hematology | Admitting: Hematology

## 2020-05-20 ENCOUNTER — Ambulatory Visit: Payer: No Typology Code available for payment source

## 2020-05-28 ENCOUNTER — Encounter: Payer: Self-pay | Admitting: Rheumatology

## 2020-05-30 ENCOUNTER — Other Ambulatory Visit: Payer: Self-pay | Admitting: Rheumatology

## 2020-06-03 NOTE — Progress Notes (Addendum)
Virtual Visit via Telephone Note  I connected with Sharon Hartman on 06/17/20 at  4:00 PM EDT by telephone and verified that I am speaking with the correct person using two identifiers.  Location: Patient: Home  Provider: Clinic  This service was conducted via virtual visit. The patient was located at home. I was located in my office.  Consent was obtained prior to the virtual visit and is aware of possible charges through their insurance for this visit.  The patient is an established patient.  Dr. Estanislado Pandy, MD conducted the virtual visit and Hazel Sams, PA-C acted as scribe during the service.  Office staff helped with scheduling follow up visits after the service was conducted.   I discussed the limitations, risks, security and privacy concerns of performing an evaluation and management service by telephone and the availability of in person appointments. I also discussed with the patient that there may be a patient responsible charge related to this service. The patient expressed understanding and agreed to proceed.  CC: Discuss AVISE labs  History of Present Illness: Patient is a 39 year old female with a past medical history of autoimmune disease.  She was started on plaquenil 200 mg 1 tablet by mouth twice daily M-F after her last office visit on 05/06/20.  She is tolerating PLQ without any side effects.  She has noticed an improvement in her aches and pains since starting on PLQ.  She has persistent pain in the right trochanteric bursa despite having a cortisone injection on 05/06/20.  She continues to have persistent fatigue, raynaud's,  She was referred to hematology due to having a positive anticoagulant, but she reports she came late for her appointment and was not seen.  She plans on calling to reschedule her appointment. She continues to take aspirin 81 mg daily.   Review of Systems  Constitutional: Positive for malaise/fatigue. Negative for fever.  HENT:       +Dry mouth Negative  for oral or nasal ulcers  Eyes: Negative for photophobia, pain, discharge and redness.       +Dry eyes  Respiratory: Negative for cough, shortness of breath and wheezing.   Cardiovascular: Negative for chest pain and palpitations.  Gastrointestinal: Negative for blood in stool, constipation and diarrhea.  Genitourinary: Negative for dysuria.  Musculoskeletal: Positive for joint pain and myalgias. Negative for back pain and neck pain.  Skin: Negative for rash.       +Raynaud's  Neurological: Negative for dizziness and headaches.  Psychiatric/Behavioral: Negative for depression. The patient is not nervous/anxious and does not have insomnia.       Observations/Objective: Physical Exam Neurological:     Mental Status: She is alert and oriented to person, place, and time.  Psychiatric:        Mood and Affect: Mood and affect normal.        Cognition and Memory: Memory normal.        Judgment: Judgment normal.    Patient reports morning stiffness for  0 minutes.   Patient denies nocturnal pain.  Difficulty dressing/grooming: Denies Difficulty climbing stairs: Denies Difficulty getting out of chair: Denies Difficulty using hands for taps, buttons, cutlery, and/or writing: Denies   Assessment and Plan: Visit Diagnoses: Autoimmune disease ([place]): Lupus anticoagulant detected on 04/27/2020.  AVISE index negative: -2.8.  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 She was started on a trial of Plaquenil  200 mg 1 tablet by mouth twice daily Monday through Friday after her office visit on 05/06/2020.  She has been tolerating Plaquenil without any side effects.  She has noticed an improvement in her joint pain and inflammation since starting on Plaquenil.  She continues to have chronic fatigue.  She has ongoing sicca symptoms and intermittent symptoms of Raynaud's. No digital ulcerations or signs  of gangrene.  She is taking aspirin 81 mg daily. She was referred to hematology for further evaluation but no showed her appointment and the referral was closed. She plans on calling the hematologist to reschedule the office visit. She will continue taking PLQ as prescribed.  She is overdue to update lab work.  Standing orders for CBC and CMP were placed today.  She will follow up in 3 months to assess her full response to PLQ.   High risk medication use -Plaquenil 200 mg 1 tablet by mouth twice daily M-F only (Started after last office visit on 05/06/20). No baseline PLQ eye exam on file. Exam is scheduled for 07/23/20. CBC and CMP WNL on 05/06/20. She is due to update lab work (1 month, 3 months, then every 5 months after starting on PLQ).  Standing orders for CBC and CMP were placed today.   She has received the covid-19 vaccination and booster dose.  She was encouraged to continue to wear a mask and social distance.   Raynaud's disease without gangrene - History of severe Raynauds. +Lupus anticoagulant: She has intermittent symptoms of raynaud's.  No digital ulcerations. She is taking Norvasc 5 mg by mouth daily and aspirin 81 mg by mouth daily.   Sicca syndrome ([place]): Her sicca symptoms are likely exacerbated by taking Adderall and Vyvanse.  Other fatigue: Chronic and unchanged.   Vitamin D deficiency: Her vitamin D was 48.05 on 12/26/2019.  Trochanteric bursitis of both hips: She had a cortisone injection performed at her last office visit on 05/06/20, which did not improve her symptoms. We will refer her to physical therapy for further evaluation and management.   Other idiopathic scoliosis, thoracic region  DDD (degenerative disc disease), lumbar: She is not having any increased lower back pain at this time.  Other medical conditions are listed as follows:   Anxiety and depression  Orthostatic hypotension  Essential tremor  Palpitations  History of ADHD  Fatty  liver   Follow Up Instructions: She will follow up in 3 months.   I discussed the assessment and treatment plan with the patient. The patient was provided an opportunity to ask questions and all were answered. The patient agreed with the plan and demonstrated an understanding of the instructions.   The patient was advised to call back or seek an in-person evaluation if the symptoms worsen or if the condition fails to improve as anticipated.  I provided 30 minutes of non-face-to-face time during this encounter.  Hazel Sams, PA-C  I reviewed the document and attest to the accuracy of the scribed document.   Bo Merino, MD

## 2020-06-16 ENCOUNTER — Other Ambulatory Visit: Payer: Self-pay | Admitting: Internal Medicine

## 2020-06-16 ENCOUNTER — Encounter: Payer: Self-pay | Admitting: Rheumatology

## 2020-06-16 ENCOUNTER — Other Ambulatory Visit: Payer: Self-pay | Admitting: Rheumatology

## 2020-06-16 NOTE — Telephone Encounter (Signed)
Last Visit: 05/06/2020 Next Visit: 06/17/2020  Current Dose per office note on 05/06/2020: Norvasc 5 mg by mouth daily   Okay to refill per Dr. Estanislado Pandy

## 2020-06-16 NOTE — Telephone Encounter (Signed)
Please reschedule her as the last patient of the day.

## 2020-06-17 ENCOUNTER — Other Ambulatory Visit: Payer: Self-pay

## 2020-06-17 ENCOUNTER — Encounter: Payer: Self-pay | Admitting: Physician Assistant

## 2020-06-17 ENCOUNTER — Telehealth (INDEPENDENT_AMBULATORY_CARE_PROVIDER_SITE_OTHER): Payer: No Typology Code available for payment source | Admitting: Rheumatology

## 2020-06-17 DIAGNOSIS — M7062 Trochanteric bursitis, left hip: Secondary | ICD-10-CM

## 2020-06-17 DIAGNOSIS — M5136 Other intervertebral disc degeneration, lumbar region: Secondary | ICD-10-CM

## 2020-06-17 DIAGNOSIS — F419 Anxiety disorder, unspecified: Secondary | ICD-10-CM

## 2020-06-17 DIAGNOSIS — G250 Essential tremor: Secondary | ICD-10-CM

## 2020-06-17 DIAGNOSIS — M3500 Sicca syndrome, unspecified: Secondary | ICD-10-CM

## 2020-06-17 DIAGNOSIS — I7300 Raynaud's syndrome without gangrene: Secondary | ICD-10-CM

## 2020-06-17 DIAGNOSIS — M359 Systemic involvement of connective tissue, unspecified: Secondary | ICD-10-CM

## 2020-06-17 DIAGNOSIS — I951 Orthostatic hypotension: Secondary | ICD-10-CM

## 2020-06-17 DIAGNOSIS — R002 Palpitations: Secondary | ICD-10-CM

## 2020-06-17 DIAGNOSIS — M7061 Trochanteric bursitis, right hip: Secondary | ICD-10-CM

## 2020-06-17 DIAGNOSIS — Z79899 Other long term (current) drug therapy: Secondary | ICD-10-CM

## 2020-06-17 DIAGNOSIS — F329 Major depressive disorder, single episode, unspecified: Secondary | ICD-10-CM

## 2020-06-17 DIAGNOSIS — R5383 Other fatigue: Secondary | ICD-10-CM

## 2020-06-17 DIAGNOSIS — Z8659 Personal history of other mental and behavioral disorders: Secondary | ICD-10-CM

## 2020-06-17 DIAGNOSIS — K760 Fatty (change of) liver, not elsewhere classified: Secondary | ICD-10-CM

## 2020-06-17 DIAGNOSIS — E559 Vitamin D deficiency, unspecified: Secondary | ICD-10-CM

## 2020-06-17 DIAGNOSIS — F32A Depression, unspecified: Secondary | ICD-10-CM

## 2020-06-17 DIAGNOSIS — M4124 Other idiopathic scoliosis, thoracic region: Secondary | ICD-10-CM

## 2020-07-02 ENCOUNTER — Other Ambulatory Visit: Payer: Self-pay

## 2020-07-02 DIAGNOSIS — Z79899 Other long term (current) drug therapy: Secondary | ICD-10-CM

## 2020-07-03 LAB — CBC WITH DIFFERENTIAL/PLATELET
Absolute Monocytes: 332 cells/uL (ref 200–950)
Basophils Absolute: 39 cells/uL (ref 0–200)
Basophils Relative: 0.6 %
Eosinophils Absolute: 59 cells/uL (ref 15–500)
Eosinophils Relative: 0.9 %
HCT: 40.6 % (ref 35.0–45.0)
Hemoglobin: 13.9 g/dL (ref 11.7–15.5)
Lymphs Abs: 3211 cells/uL (ref 850–3900)
MCH: 31.3 pg (ref 27.0–33.0)
MCHC: 34.2 g/dL (ref 32.0–36.0)
MCV: 91.4 fL (ref 80.0–100.0)
MPV: 10.3 fL (ref 7.5–12.5)
Monocytes Relative: 5.1 %
Neutro Abs: 2860 cells/uL (ref 1500–7800)
Neutrophils Relative %: 44 %
Platelets: 346 10*3/uL (ref 140–400)
RBC: 4.44 10*6/uL (ref 3.80–5.10)
RDW: 11.8 % (ref 11.0–15.0)
Total Lymphocyte: 49.4 %
WBC: 6.5 10*3/uL (ref 3.8–10.8)

## 2020-07-03 LAB — COMPLETE METABOLIC PANEL WITH GFR
AG Ratio: 1.9 (calc) (ref 1.0–2.5)
ALT: 18 U/L (ref 6–29)
AST: 16 U/L (ref 10–30)
Albumin: 4.3 g/dL (ref 3.6–5.1)
Alkaline phosphatase (APISO): 32 U/L (ref 31–125)
BUN: 12 mg/dL (ref 7–25)
CO2: 25 mmol/L (ref 20–32)
Calcium: 9.6 mg/dL (ref 8.6–10.2)
Chloride: 104 mmol/L (ref 98–110)
Creat: 0.61 mg/dL (ref 0.50–1.10)
GFR, Est African American: 133 mL/min/{1.73_m2} (ref >=60)
GFR, Est Non African American: 115 mL/min/{1.73_m2} (ref >=60)
Globulin: 2.3 g/dL (calc) (ref 1.9–3.7)
Glucose, Bld: 105 mg/dL — ABNORMAL HIGH (ref 65–99)
Potassium: 4.2 mmol/L (ref 3.5–5.3)
Sodium: 138 mmol/L (ref 135–146)
Total Bilirubin: 0.2 mg/dL (ref 0.2–1.2)
Total Protein: 6.6 g/dL (ref 6.1–8.1)

## 2020-07-03 NOTE — Progress Notes (Signed)
CBC and CMP normal

## 2020-07-15 ENCOUNTER — Other Ambulatory Visit: Payer: Self-pay | Admitting: Rheumatology

## 2020-07-15 MED ORDER — HYDROXYCHLOROQUINE SULFATE 200 MG PO TABS: ORAL_TABLET | ORAL | 0 refills | Status: DC

## 2020-07-15 NOTE — Telephone Encounter (Unsigned)
Last Visit: 06/17/2020 Next Visit: 09/28/2020 Labs: 07/02/2020 WNL Eye exam: No baseline PLQ eye exam on file. Exam is scheduled for 07/23/20  Current Dose per office note ***: Plaquenil 200 mg 1 tablet by mouth twice daily M-F only  DX: Autoimmune disease   Okay to refill Plaquenil?

## 2020-07-17 ENCOUNTER — Telehealth: Payer: Self-pay | Admitting: Hematology

## 2020-07-17 NOTE — Telephone Encounter (Signed)
Sharon Hartman has been scheduled to see Dr. Irene Limbo on 11/2 at 1pm.

## 2020-07-20 ENCOUNTER — Telehealth: Payer: Self-pay

## 2020-07-20 NOTE — Telephone Encounter (Signed)
Patient called stating she requested a prescription refill of Hydroxychloroquine to be sent to [facility] last week.  Patient states she called [facility] today to check on prescription since she was due to take her medication today and was told by the pharmacist they never received refill request from Dr. Estanislado Pandy.  Please advise.

## 2020-07-21 NOTE — Telephone Encounter (Signed)
I called [facility] and plaquenil prescription was received and placed on hold at the pharmacy. The pharmacist was unable to tell me why the prescription was on hold but they will get prescription ready for the patient now. I called patient to advise and she verbalized understanding.

## 2020-08-06 ENCOUNTER — Telehealth: Payer: Self-pay | Admitting: Hematology

## 2020-08-06 MED ORDER — HYDROXYCHLOROQUINE SULFATE 200 MG PO TABS: ORAL_TABLET | ORAL | 0 refills | Status: DC

## 2020-08-06 NOTE — Telephone Encounter (Signed)
Ms. Partridge cld to reschedule her new pt appt to 11/22 at 1pm to see Dr. Irene Limbo.

## 2020-08-06 NOTE — Telephone Encounter (Signed)
I called the patient to address the concern she is experiencing. I advised her to start taking Plaquenil 200 mg 1 tablet alternating with 2 tablets every other day to see if this improves the symptoms she has been experiencing.  I also discussed the association with positive anti-C1q with autoimmune disease.  I discussed that we do not recommend any more aggressive treatment at this time.  We will recheck AVISE lab work on a yearly basis to monitor her antibodies. I discussed that we can also refer her for second opinion if she would like in the future but she declined at this time. We discussed that myalgias and arthralgias are very common with autoimmune disease.  She plans on following up with an orthopedist to discuss the severity of the right hip pain she has been experiencing.  Patient reports that she has established care with a hematologist and has also had an updated Plaquenil eye exam which was normal.  All questions were addressed today.  She was advised to notify us if she develops any new or worsening symptoms.  Please update the patients prescription for PLQ to reflect the dose change and please check if we have records of the updated PLQ eye exam.

## 2020-08-11 ENCOUNTER — Inpatient Hospital Stay: Payer: No Typology Code available for payment source | Admitting: Hematology

## 2020-08-27 ENCOUNTER — Other Ambulatory Visit: Payer: Self-pay

## 2020-08-27 ENCOUNTER — Encounter (HOSPITAL_BASED_OUTPATIENT_CLINIC_OR_DEPARTMENT_OTHER): Payer: Self-pay

## 2020-08-27 ENCOUNTER — Emergency Department (HOSPITAL_BASED_OUTPATIENT_CLINIC_OR_DEPARTMENT_OTHER)
Admission: EM | Admit: 2020-08-27 | Discharge: 2020-08-27 | Disposition: A | Discharge status: Home / Self Care | Payer: No Typology Code available for payment source | Attending: Emergency Medicine | Admitting: Emergency Medicine

## 2020-08-27 ENCOUNTER — Other Ambulatory Visit (HOSPITAL_BASED_OUTPATIENT_CLINIC_OR_DEPARTMENT_OTHER): Payer: Self-pay | Admitting: Emergency Medicine

## 2020-08-27 ENCOUNTER — Emergency Department (HOSPITAL_BASED_OUTPATIENT_CLINIC_OR_DEPARTMENT_OTHER): Payer: No Typology Code available for payment source

## 2020-08-27 DIAGNOSIS — Z7982 Long term (current) use of aspirin: Secondary | ICD-10-CM | POA: Diagnosis not present

## 2020-08-27 DIAGNOSIS — Z85828 Personal history of other malignant neoplasm of skin: Secondary | ICD-10-CM | POA: Diagnosis not present

## 2020-08-27 DIAGNOSIS — Z87891 Personal history of nicotine dependence: Secondary | ICD-10-CM | POA: Diagnosis not present

## 2020-08-27 DIAGNOSIS — R0781 Pleurodynia: Secondary | ICD-10-CM | POA: Diagnosis present

## 2020-08-27 DIAGNOSIS — Y9301 Activity, walking, marching and hiking: Secondary | ICD-10-CM | POA: Insufficient documentation

## 2020-08-27 DIAGNOSIS — M545 Low back pain, unspecified: Secondary | ICD-10-CM | POA: Insufficient documentation

## 2020-08-27 DIAGNOSIS — Z79899 Other long term (current) drug therapy: Secondary | ICD-10-CM | POA: Insufficient documentation

## 2020-08-27 DIAGNOSIS — S7001XA Contusion of right hip, initial encounter: Secondary | ICD-10-CM

## 2020-08-27 DIAGNOSIS — M25551 Pain in right hip: Secondary | ICD-10-CM | POA: Diagnosis not present

## 2020-08-27 DIAGNOSIS — W19XXXA Unspecified fall, initial encounter: Secondary | ICD-10-CM

## 2020-08-27 DIAGNOSIS — W541XXA Struck by dog, initial encounter: Secondary | ICD-10-CM | POA: Diagnosis not present

## 2020-08-27 DIAGNOSIS — S20211A Contusion of right front wall of thorax, initial encounter: Secondary | ICD-10-CM

## 2020-08-27 HISTORY — DX: Reserved for concepts with insufficient information to code with codable children: IMO0002

## 2020-08-27 HISTORY — DX: Systemic lupus erythematosus, unspecified: M32.9

## 2020-08-27 LAB — PREGNANCY, URINE: Preg Test, Ur: NEGATIVE

## 2020-08-27 MED ORDER — KETOROLAC TROMETHAMINE 60 MG/2ML IM SOLN
60.0000 mg | Freq: Once | INTRAMUSCULAR | Status: AC
  Administered 2020-08-27: 60 mg via INTRAMUSCULAR
  Filled 2020-08-27: qty 2

## 2020-08-27 MED ORDER — CYCLOBENZAPRINE HCL 10 MG PO TABS: 10.0000 mg | ORAL_TABLET | Freq: Three times a day (TID) | ORAL | 0 refills | Status: DC | PRN

## 2020-08-27 MED FILL — CYCLOBENZAPRINE HCL 10 MG T: 10 | 5 days supply | Qty: 15 | Fill #0

## 2020-08-27 NOTE — ED Provider Notes (Signed)
[facility] EMERGENCY DEPARTMENT Provider Note   CSN: [id number] Arrival date & time: 08/27/20  8768     History Chief Complaint  Patient presents with  . Fall    Sharon Hartman is a 39 y.o. female.  Patient is a 39 year old female with past medical history of lupus, ADHD, hyper lipidemia, and anxiety.  She presents today for evaluation of fall.  Patient was walking her dog this morning when the dog began to chase another dog walker.  The dog pulled her to the ground with the leash causing her to fall on her right hip and ribs.  She is complaining of pain in these areas.  She denies any difficulty breathing.  She denies any loss of consciousness.  The history is provided by the patient.  Fall This is a new problem. The current episode started 1 to 2 hours ago. The problem occurs constantly. The problem has not changed since onset.Exacerbated by: Movement and palpation. Nothing relieves the symptoms.       Past Medical History:  Diagnosis Date  . ADD (attention deficit disorder)   . Anxiety   . Constipation   . DDD (degenerative disc disease), lumbar   . Depression   . Fatty liver   . Galactorrhea    bilateral nipple discharge  . History of basal cell carcinoma (BCC) excision    back 2017 excision  . History of cervical dysplasia    CIN II  s/p LEEP 06/ 2005  . History of suicide attempt    2003; 2006  . History of syncope    02/ 2015  w/ palpitations and pregnant--- evaluation by cardiology, dr Debara Pickett, note in epic 03/ 2015, work-up done w/ normal echo and event monitor showed SR with isolated PVCs/ PACs  . Hyperlipidemia   . Low testosterone   . Low vitamin D level   . Lupus ([place])   . Migraine   . Numbness    bilateral hands and feet due to raynaurd's disease  . Polyarthralgia   . PONV (postoperative nausea and vomiting)    also lightweight when it comes to anesthesia  . Raynaud's disease    rheumotology--- dr Estanislado Pandy--  with bilateral hand and feet  numbness, and nipples per pt;  takes norvasc  . Right lower quadrant pain   . Scoliosis    thoracic  . Sicca syndrome ([place])   . Tremor, unspecified    intermittant left hand/ wrist tremor with dorsiflexion  ( neurology evaluation by dr tat note in epic 08-15-2019, felt to be clonus)    Patient Active Problem List   Diagnosis Date Noted  . Tremor 09/12/2019  . Chronic migraine 09/12/2019  . Postpartum care following vaginal delivery (9/9) 06/18/2014  . Post-dates pregnancy 06/17/2014  . Palpitations 11/22/2013  . Syncope 11/22/2013  . Orthostatic hypotension 11/22/2013  . ADHD (attention deficit hyperactivity disorder) 12/04/2011    Past Surgical History:  Procedure Laterality Date  . DILATATION & CURETTAGE/HYSTEROSCOPY WITH MYOSURE N/A 08/23/2019   Procedure: DILATATION & CURETTAGE/HYSTEROSCOPY WITH MYOSURE;  Surgeon: Servando Salina, MD;  Location: [facility];  Service: Gynecology;  Laterality: N/A;  . DILATION AND CURETTAGE OF UTERUS  1999  . GANGLION CYST EXCISION Right 2011   foot  . LAPAROSCOPIC LYSIS OF ADHESIONS N/A 12/23/2019   Procedure: DIAGNOSTIC LAPAROSCOPY WITH PERITONEAL BIOPSY;  Surgeon: Servando Salina, MD;  Location: [facility];  Service: Gynecology;  Laterality: N/A;  2 1/2 hrs.  Marland Kitchen LEEP  03-31-2004   @  [facility]   CIN II  . TONSILLECTOMY  1998  . WISDOM TOOTH EXTRACTION  1999     OB History    Gravida  2   Para  1   Term  1   Preterm      AB  1   Living  1     SAB      TAB  1   Ectopic      Multiple      Live Births  1           Family History  Problem Relation Age of Onset  . Heart murmur Mother   . Diabetes Mother   . Hepatitis Mother        auto immune   . Breast cancer Paternal Grandmother   . Hypertension Paternal Grandmother   . Hyperlipidemia Paternal Grandmother   . Heart disease Paternal Grandmother   . Hypertension Paternal Grandfather   . Heart disease Paternal Grandfather         CABG  . Heart attack Paternal Grandfather        X4  . Hyperlipidemia Paternal Grandfather   . Hypertension Father   . Hyperlipidemia Father   . Hashimoto's thyroiditis Sister   . Depression Sister   . Breast cancer Sister   . Anxiety disorder Sister   . Scoliosis Brother   . Drug abuse Brother   . Autism Son     Social History   Tobacco Use  . Smoking status: Former Smoker    Packs/day: 0.20    Years: 7.00    Pack years: 1.40    Types: Cigarettes    Quit date: 08/20/2004    Years since quitting: 16.0  . Smokeless tobacco: Never Used  Vaping Use  . Vaping Use: Never used  Substance Use Topics  . Alcohol use: Yes    Comment: rarely  . Drug use: Never    Home Medications Prior to Admission medications   Medication Sig Start Date End Date Taking? Authorizing Provider  amLODipine (NORVASC) 5 MG tablet TAKE 1 TABLET BY MOUTH EVERY DAY AT BEDTIME 06/16/20  Yes Deveshwar, Abel Presto, MD  amphetamine-dextroamphetamine (ADDERALL) 10 MG tablet Take 10 mg by mouth daily with breakfast.   Yes [provider]  aspirin EC 81 MG tablet Take 81 mg by mouth daily.   Yes [provider]  desvenlafaxine (PRISTIQ) 50 MG 24 hr tablet Take 50 mg by mouth daily. 03/26/20  Yes [provider]  gabapentin (NEURONTIN) 100 MG capsule Take 100 mg by mouth 3 (three) times daily.   Yes [provider]  hydroxychloroquine (PLAQUENIL) 200 MG tablet Take 1 tablet alternating with 2 tablets po every other day 08/06/20  Yes Ofilia Neas, PA-C  ibuprofen (ADVIL) 800 MG tablet Take 1 tablet (800 mg total) by mouth every 8 (eight) hours as needed for cramping. 08/23/19  Yes Servando Salina, MD  loratadine (CLARITIN) 10 MG tablet Take 10 mg by mouth daily.   Yes [provider]  Magnesium 250 MG TABS Take 250 mg by mouth daily.    Yes [provider]  Multiple Vitamins-Minerals (MULTIVITAMIN ADULT PO) Take 1 tablet by mouth daily.    Yes [provider]  norethindrone-ethinyl estradiol (LOESTRIN) 1-20 MG-MCG tablet Take 1 tablet by mouth daily. 04/02/20  Yes [provider]  Riboflavin 400 MG TABS Take 400 mg by mouth daily.    Yes [provider]  rosuvastatin (CRESTOR) 5 MG tablet TAKE  1 TABLET BY MOUTH EVERY DAY 06/16/20  Yes Philemon Kingdom, MD  sertraline (ZOLOFT) 100 MG tablet Take 100 mg by mouth daily.   Yes [provider]  albuterol (VENTOLIN HFA) 108 (90 Base) MCG/ACT inhaler albuterol sulfate HFA 90 mcg/actuation aerosol inhaler    [provider]  Galcanezumab-gnlm (EMGALITY) 120 MG/ML SOAJ Inject into the skin every 30 (thirty) days.    [provider]  linaclotide Rolan Lipa) 72 MCG capsule Take 72 mcg by mouth daily before breakfast.  09/03/19   [provider]  lisdexamfetamine (VYVANSE) 50 MG capsule Take 50 mg by mouth daily.    [provider]  LORazepam (ATIVAN) 0.5 MG tablet as needed.    [provider]  Melatonin 10 MG TABS Take by mouth at bedtime.     [provider]  Probiotic Product (PROBIOTIC PO) Take 1 capsule by mouth daily.     [provider]  Wheat Dextrin (BENEFIBER PO) Take 1 Scoop by mouth daily.     [provider]    Allergies    Patient has no known allergies.  Review of Systems   Review of Systems  All other systems reviewed and are negative.   Physical Exam Updated Vital Signs BP 119/72 (BP Location: Right Arm)   Pulse 87   Temp 98 F (36.7 C) (Oral)   Resp 18   Ht 5\' 6"  (1.676 m)   Wt 72.6 kg   SpO2 99%   BMI 25.82 kg/m   Physical Exam Vitals and nursing note reviewed.  Constitutional:      General: She is not in acute distress.    Appearance: She is well-developed. She is not diaphoretic.  HENT:     Head: Normocephalic and atraumatic.  Cardiovascular:     Rate and Rhythm: Normal rate and regular rhythm.     Heart sounds: No murmur heard.  No friction rub. No gallop.    Pulmonary:     Effort: Pulmonary effort is normal. No respiratory distress.     Breath sounds: Normal breath sounds. No wheezing.     Comments: There is tenderness to palpation over the right lateral lower ribs.  There is no crepitus or palpable abnormality.  Breath sounds are equal and clear. Abdominal:     General: Bowel sounds are normal. There is no distension.     Palpations: Abdomen is soft.     Tenderness: There is no abdominal tenderness.  Musculoskeletal:        General: Normal range of motion.     Cervical back: Normal range of motion and neck supple.     Comments: There is tenderness to palpation in the soft tissues of the right lower lumbar region and right lateral hip.  She has no step-off.  Distal PMS is intact of the right leg.  Skin:    General: Skin is warm and dry.  Neurological:     Mental Status: She is alert and oriented to person, place, and time.     ED Results / Procedures / Treatments   Labs (all labs ordered are listed, but only abnormal results are displayed) Labs Reviewed - No data to display  EKG None  Radiology No results found.  Procedures Procedures (including critical care time)  Medications Ordered in ED Medications - No data to display  ED Course  I have reviewed the triage vital signs and the nursing notes.  Pertinent labs & imaging results that were available during my care of the  patient were reviewed by me and considered in my medical decision making (see chart for details).    MDM Rules/Calculators/A&P  Patient presents here after a fall, the details of which are described in the HPI.  She is complaining of pain to the right ribs, right hip, and low back.  X-rays of all of these areas are unremarkable.  Patient is hemodynamically stable and in no acute distress.  She will be discharged with as needed ibuprofen and return as needed.  Final Clinical Impression(s) / ED Diagnoses Final diagnoses:  None    Rx / DC Orders ED  Discharge Orders    None       Veryl Speak, MD 08/27/20 1219

## 2020-08-27 NOTE — Discharge Instructions (Addendum)
Take ibuprofen 600 mg every 6 hours as needed for pain.  Apply ice to affected areas for 20 minutes every 2 hours while awake for the next 2 days.  Follow-up with primary doctor if not improving in the next week.

## 2020-08-27 NOTE — ED Triage Notes (Signed)
Pt reports she was walking her dog when she was pulled by her dog causing her to fall. Pt states the dog drug her ripping her clothes. Pt reports hx of lupus and R hip and R sided back pain.

## 2020-08-31 ENCOUNTER — Encounter: Payer: Self-pay | Admitting: Hematology

## 2020-08-31 ENCOUNTER — Other Ambulatory Visit: Payer: Self-pay

## 2020-08-31 ENCOUNTER — Inpatient Hospital Stay: Payer: No Typology Code available for payment source

## 2020-08-31 ENCOUNTER — Inpatient Hospital Stay: Payer: No Typology Code available for payment source | Attending: Hematology | Admitting: Hematology

## 2020-08-31 VITALS — BP 98/58 | HR 91 | Temp 98.3°F | Resp 18 | Ht 66.0 in | Wt 166.1 lb

## 2020-08-31 DIAGNOSIS — Z79899 Other long term (current) drug therapy: Secondary | ICD-10-CM | POA: Diagnosis not present

## 2020-08-31 DIAGNOSIS — D500 Iron deficiency anemia secondary to blood loss (chronic): Secondary | ICD-10-CM | POA: Diagnosis not present

## 2020-08-31 DIAGNOSIS — R760 Raised antibody titer: Secondary | ICD-10-CM

## 2020-08-31 DIAGNOSIS — K760 Fatty (change of) liver, not elsewhere classified: Secondary | ICD-10-CM | POA: Diagnosis not present

## 2020-08-31 DIAGNOSIS — Z7982 Long term (current) use of aspirin: Secondary | ICD-10-CM | POA: Insufficient documentation

## 2020-08-31 DIAGNOSIS — D6862 Lupus anticoagulant syndrome: Secondary | ICD-10-CM | POA: Diagnosis present

## 2020-08-31 DIAGNOSIS — R5383 Other fatigue: Secondary | ICD-10-CM | POA: Diagnosis not present

## 2020-08-31 DIAGNOSIS — Z87891 Personal history of nicotine dependence: Secondary | ICD-10-CM | POA: Diagnosis not present

## 2020-08-31 DIAGNOSIS — I7300 Raynaud's syndrome without gangrene: Secondary | ICD-10-CM | POA: Insufficient documentation

## 2020-08-31 LAB — CBC WITH DIFFERENTIAL/PLATELET
Abs Immature Granulocytes: 0.02 10*3/uL (ref 0.00–0.07)
Basophils Absolute: 0 10*3/uL (ref 0.0–0.1)
Basophils Relative: 1 %
Eosinophils Absolute: 0 10*3/uL (ref 0.0–0.5)
Eosinophils Relative: 0 %
HCT: 41.8 % (ref 36.0–46.0)
Hemoglobin: 14.2 g/dL (ref 12.0–15.0)
Immature Granulocytes: 0 %
Lymphocytes Relative: 37 %
Lymphs Abs: 2.5 10*3/uL (ref 0.7–4.0)
MCH: 30.3 pg (ref 26.0–34.0)
MCHC: 34 g/dL (ref 30.0–36.0)
MCV: 89.3 fL (ref 80.0–100.0)
Monocytes Absolute: 0.3 10*3/uL (ref 0.1–1.0)
Monocytes Relative: 5 %
Neutro Abs: 4 10*3/uL (ref 1.7–7.7)
Neutrophils Relative %: 57 %
Platelets: 342 10*3/uL (ref 150–400)
RBC: 4.68 MIL/uL (ref 3.87–5.11)
RDW: 11.9 % (ref 11.5–15.5)
WBC: 7 10*3/uL (ref 4.0–10.5)
nRBC: 0 % (ref 0.0–0.2)

## 2020-08-31 LAB — CMP (CANCER CENTER ONLY)
ALT: 12 U/L (ref 0–44)
AST: 16 U/L (ref 15–41)
Albumin: 4.4 g/dL (ref 3.5–5.0)
Alkaline Phosphatase: 32 U/L — ABNORMAL LOW (ref 38–126)
Anion gap: 8 (ref 5–15)
BUN: 11 mg/dL (ref 6–20)
CO2: 23 mmol/L (ref 22–32)
Calcium: 9.1 mg/dL (ref 8.9–10.3)
Chloride: 107 mmol/L (ref 98–111)
Creatinine: 0.73 mg/dL (ref 0.44–1.00)
GFR, Estimated: >60 mL/min (ref >60)
Glucose, Bld: 85 mg/dL (ref 70–99)
Potassium: 3.9 mmol/L (ref 3.5–5.1)
Sodium: 138 mmol/L (ref 135–145)
Total Bilirubin: 0.3 mg/dL (ref 0.3–1.2)
Total Protein: 7.5 g/dL (ref 6.5–8.1)

## 2020-08-31 LAB — IRON AND TIBC
Iron: 126 ug/dL (ref 41–142)
Saturation Ratios: 27 % (ref 21–57)
TIBC: 463 ug/dL — ABNORMAL HIGH (ref 236–444)
UIBC: 337 ug/dL (ref 120–384)

## 2020-08-31 LAB — FERRITIN: Ferritin: 30 ng/mL (ref 11–307)

## 2020-08-31 NOTE — Patient Instructions (Signed)
Thank you for choosing [facility] to provide your oncology and hematology care.   Should you have questions after your visit to the [facility]), please contact this office at [phone] between 8:30 AM and 4:30 PM.  Voice mails left after 4:00 PM may not be returned until the following business day.  Calls received after 4:30 PM will be answered by an off-site Nurse Triage Line.    Prescription Refills:  Please have your pharmacy contact us directly for most prescription requests.  Contact the office directly for refills of narcotics (pain medications). Allow 48-72 hours for refills.  Appointments: Please contact the [facility] scheduling department [phone] for questions regarding [facility] appointment scheduling.  Contact the schedulers with any scheduling changes so that your appointment can be rescheduled in a timely manner.   Central Scheduling for [facility] [phone] - Call to schedule procedures such as PET scans, CT scans, MRI, Ultrasound, etc.  To afford each patient quality time with our providers, please arrive 15-30 minutes before your scheduled appointment time.  If you arrive late for your appointment, you may be asked to reschedule.  We strive to give you quality time with our providers, and arriving late affects you and other patients whose appointments are after yours. If you are a no show for multiple scheduled visits, you may be dismissed from the clinic at the providers discretion.     Resources: [place] [phone] for additional information on assistance programs or assistance connecting with community support programs   [place]  [phone]: Information regarding food stamps, Medicaid, and utility assistance [employer]'s shared-ride transportation service for eligible riders who have a disability that prevents them from riding the fixed route bus.   [place] [phone] Helps people with Medicare understand their rights and benefits, navigate the Medicare system, and secure the quality healthcare they deserve [employer] ([phone] Assists patients locate various types of support and financial assistance Cancer Care: 1-800-813-HOPE (731) 448-8547) Provides financial assistance, online support groups, medication/co-pay assistance.   Transportation Assistance for appointments at [facility] : [facility] [phone]  Again, thank you for choosing [facility] for your care.

## 2020-08-31 NOTE — Progress Notes (Signed)
HEMATOLOGY/ONCOLOGY CONSULTATION NOTE  Date of Service: 08/31/2020  Patient Care Team: Donald Prose, MD as PCP - General (Family Medicine)  CHIEF COMPLAINTS/PURPOSE OF CONSULTATION:  Positive Lupus Anticoagulant   HISTORY OF PRESENTING ILLNESS:   Sharon Hartman is a wonderful 39 y.o. female who has been referred to [place] by Dr. Estanislado Pandy for evaluation and management of positive lupus anticoagulant. The pt reports that she is doing well overall.   The pt reports that she has an autoimmune condition, likely Lupus, and was started on Plaquenil 3-4 months ago. Pt has been experiencing fatigue, joint pain, dizziness, and rash. Pt does not feel that Plaquenil has improved these symptoms. In the early 2000's pt had a miscarriage at around 8 weeks. She had a successful pregnancy following this miscarriage and has one child. The pt denies any history of blood clots. Pt takes a daily baby Aspirin for Raynaud's. Pt has been taking low-estrogen birth control to control her menorrhagia for nearly a year. She has a history of Migraines, which have been improved with Emgality. Pt has intermittent Galactorrhea that has been evaluated. She has had both milky and bloody discharge. The last time she experienced an episode was last year. She denies any varicose veins.   Most recent lab results (07/02/2020) of CBC is as follows: all values are WNL except for Glucose at 105. 04/27/2020 Lupus Anticoagulant is as follows: PTT-LA Screen at 32, dRVVT at 55, PT Mixing Interp is Positive.  On review of systems, pt reports fatigue, joint pain, dizziness, intermittent rash and denies abdominal pain, leg pain, leg swelling and any other symptoms.   On PMHx the pt reports Migraines, Miscarriage, Raynaud's syndrome, Basal Cell Carcinoma, Galactorrhea, Menorrhagia. On Social Hx the pt reports that she she quit smoking in 2005. She currently works as a Merchandiser, retail.  On Family Hx the pt reports that her mother had  Autoimmune hepatitis and her sister had Hashimoto's disease and Breast Cancer, which was discovered in her 20's. She also has a strong family history of Breast Cancer on her father's side.  MEDICAL HISTORY:  Past Medical History:  Diagnosis Date  . ADD (attention deficit disorder)   . Anxiety   . Constipation   . DDD (degenerative disc disease), lumbar   . Depression   . Fatty liver   . Galactorrhea    bilateral nipple discharge  . History of basal cell carcinoma (BCC) excision    back 2017 excision  . History of cervical dysplasia    CIN II  s/p LEEP 06/ 2005  . History of suicide attempt    2003; 2006  . History of syncope    02/ 2015  w/ palpitations and pregnant--- evaluation by cardiology, dr Debara Pickett, note in epic 03/ 2015, work-up done w/ normal echo and event monitor showed SR with isolated PVCs/ PACs  . Hyperlipidemia   . Low testosterone   . Low vitamin D level   . Lupus ([place])   . Migraine   . Numbness    bilateral hands and feet due to raynaurd's disease  . Polyarthralgia   . PONV (postoperative nausea and vomiting)    also lightweight when it comes to anesthesia  . Raynaud's disease    rheumotology--- dr Estanislado Pandy--  with bilateral hand and feet numbness, and nipples per pt;  takes norvasc  . Right lower quadrant pain   . Scoliosis    thoracic  . Sicca syndrome ([place])   . Tremor, unspecified  intermittant left hand/ wrist tremor with dorsiflexion  ( neurology evaluation by dr tat note in epic 08-15-2019, felt to be clonus)    SURGICAL HISTORY: Past Surgical History:  Procedure Laterality Date  . DILATATION & CURETTAGE/HYSTEROSCOPY WITH MYOSURE N/A 08/23/2019   Procedure: DILATATION & CURETTAGE/HYSTEROSCOPY WITH MYOSURE;  Surgeon: Servando Salina, MD;  Location: [facility];  Service: Gynecology;  Laterality: N/A;  . DILATION AND CURETTAGE OF UTERUS  1999  . GANGLION CYST EXCISION Right 2011   foot  . LAPAROSCOPIC LYSIS OF ADHESIONS N/A  12/23/2019   Procedure: DIAGNOSTIC LAPAROSCOPY WITH PERITONEAL BIOPSY;  Surgeon: Servando Salina, MD;  Location: [facility];  Service: Gynecology;  Laterality: N/A;  2 1/2 hrs.  Marland Kitchen LEEP  03-31-2004   @[facility]    CIN II  . TONSILLECTOMY  1998  . WISDOM TOOTH EXTRACTION  1999    SOCIAL HISTORY: Social History   Socioeconomic History  . Marital status: Married    Spouse name: Not on file  . Number of children: 1  . Years of education: college  . Highest education level: Not on file  Occupational History  . Occupation: Therapist, sports  Tobacco Use  . Smoking status: Former Smoker    Packs/day: 0.20    Years: 7.00    Pack years: 1.40    Types: Cigarettes    Quit date: 08/20/2004    Years since quitting: 16.0  . Smokeless tobacco: Never Used  Vaping Use  . Vaping Use: Never used  Substance and Sexual Activity  . Alcohol use: Yes    Comment: rarely  . Drug use: Never  . Sexual activity: Yes    Birth control/protection: None  Other Topics Concern  . Not on file  Social History Narrative   Lives at home with husband and son.   Right-handed.   No caffeine use.   Social Determinants of Health   Financial Resource Strain:   . Difficulty of Paying Living Expenses: Not on file  Food Insecurity:   . Worried About Charity fundraiser in the Last Year: Not on file  . Ran Out of Food in the Last Year: Not on file  Transportation Needs:   . Lack of Transportation (Medical): Not on file  . Lack of Transportation (Non-Medical): Not on file  Physical Activity:   . Days of Exercise per Week: Not on file  . Minutes of Exercise per Session: Not on file  Stress:   . Feeling of Stress : Not on file  Social Connections:   . Frequency of Communication with Friends and Family: Not on file  . Frequency of Social Gatherings with Friends and Family: Not on file  . Attends Religious Services: Not on file  . Active Member of Clubs or Organizations: Not on file  . Attends Theatre manager Meetings: Not on file  . Marital Status: Not on file  Intimate Partner Violence:   . Fear of Current or Ex-Partner: Not on file  . Emotionally Abused: Not on file  . Physically Abused: Not on file  . Sexually Abused: Not on file    FAMILY HISTORY: Family History  Problem Relation Age of Onset  . Heart murmur Mother   . Diabetes Mother   . Hepatitis Mother        auto immune   . Breast cancer Paternal Grandmother   . Hypertension Paternal Grandmother   . Hyperlipidemia Paternal Grandmother   . Heart disease Paternal Grandmother   . Hypertension  Paternal Grandfather   . Heart disease Paternal Grandfather        CABG  . Heart attack Paternal Grandfather        X4  . Hyperlipidemia Paternal Grandfather   . Hypertension Father   . Hyperlipidemia Father   . Hashimoto's thyroiditis Sister   . Depression Sister   . Breast cancer Sister   . Anxiety disorder Sister   . Scoliosis Brother   . Drug abuse Brother   . Autism Son     ALLERGIES:  has No Known Allergies.  MEDICATIONS:  Current Outpatient Medications  Medication Sig Dispense Refill  . albuterol (VENTOLIN HFA) 108 (90 Base) MCG/ACT inhaler albuterol sulfate HFA 90 mcg/actuation aerosol inhaler    . amLODipine (NORVASC) 5 MG tablet TAKE 1 TABLET BY MOUTH EVERY DAY AT BEDTIME 90 tablet 0  . amphetamine-dextroamphetamine (ADDERALL) 10 MG tablet Take 10 mg by mouth daily with breakfast.    . aspirin EC 81 MG tablet Take 81 mg by mouth daily.    . cyclobenzaprine (FLEXERIL) 10 MG tablet Take 1 tablet (10 mg total) by mouth 3 (three) times daily as needed for muscle spasms. 15 tablet 0  . desvenlafaxine (PRISTIQ) 50 MG 24 hr tablet Take 50 mg by mouth daily.    Marland Kitchen gabapentin (NEURONTIN) 100 MG capsule Take 100 mg by mouth 3 (three) times daily.    . Galcanezumab-gnlm (EMGALITY) 120 MG/ML SOAJ Inject into the skin every 30 (thirty) days.    . hydroxychloroquine (PLAQUENIL) 200 MG tablet Take 1 tablet alternating  with 2 tablets po every other day 135 tablet 0  . ibuprofen (ADVIL) 800 MG tablet Take 1 tablet (800 mg total) by mouth every 8 (eight) hours as needed for cramping. 30 tablet 5  . linaclotide (LINZESS) 72 MCG capsule Take 72 mcg by mouth daily before breakfast.     . lisdexamfetamine (VYVANSE) 50 MG capsule Take 50 mg by mouth daily.    Marland Kitchen loratadine (CLARITIN) 10 MG tablet Take 10 mg by mouth daily.    Marland Kitchen LORazepam (ATIVAN) 0.5 MG tablet as needed.    . Magnesium 250 MG TABS Take 250 mg by mouth daily.     . Melatonin 10 MG TABS Take by mouth at bedtime.     . Multiple Vitamins-Minerals (MULTIVITAMIN ADULT PO) Take 1 tablet by mouth daily.     . norethindrone-ethinyl estradiol (LOESTRIN) 1-20 MG-MCG tablet Take 1 tablet by mouth daily.    . Probiotic Product (PROBIOTIC PO) Take 1 capsule by mouth daily.     . Riboflavin 400 MG TABS Take 400 mg by mouth daily.     . rosuvastatin (CRESTOR) 5 MG tablet TAKE 1 TABLET BY MOUTH EVERY DAY 90 tablet 1  . sertraline (ZOLOFT) 100 MG tablet Take 100 mg by mouth daily.    . Wheat Dextrin (BENEFIBER PO) Take 1 Scoop by mouth daily.      No current facility-administered medications for this visit.    REVIEW OF SYSTEMS:    10 Point review of Systems was done is negative except as noted above.  PHYSICAL EXAMINATION: ECOG PERFORMANCE STATUS: 0 - Asymptomatic  . Vitals:   08/31/20 1315  BP: (!) 98/58  Pulse: 91  Resp: 18  Temp: 98.3 F (36.8 C)  SpO2: 100%   Filed Weights   08/31/20 1315  Weight: 166 lb 1.6 oz (75.3 kg)   .Body mass index is 26.81 kg/m.  GENERAL:alert, in no acute distress and comfortable SKIN: no  acute rashes, no significant lesions EYES: conjunctiva are pink and non-injected, sclera anicteric OROPHARYNX: MMM, no exudates, no oropharyngeal erythema or ulceration NECK: supple, no JVD LYMPH:  no palpable lymphadenopathy in the cervical, axillary or inguinal regions LUNGS: clear to auscultation b/l with normal  respiratory effort HEART: regular rate & rhythm ABDOMEN:  normoactive bowel sounds , non tender, not distended. Extremity: no pedal edema PSYCH: alert & oriented x 3 with fluent speech NEURO: no focal motor/sensory deficits  LABORATORY DATA:  I have reviewed the data as listed  . CBC Latest Ref Rng & Units 08/31/2020 07/02/2020 05/06/2020  WBC 4.0 - 10.5 K/uL 7.0 6.5 8.4  Hemoglobin 12.0 - 15.0 g/dL 14.2 13.9 13.8  Hematocrit 36 - 46 % 41.8 40.6 42.1  Platelets 150 - 400 K/uL 342 346 351    . CMP Latest Ref Rng & Units 08/31/2020 07/02/2020 05/06/2020  Glucose 70 - 99 mg/dL 85 105(H) 92  BUN 6 - 20 mg/dL 11 12 15   Creatinine 0.44 - 1.00 mg/dL 0.73 0.61 0.66  Sodium 135 - 145 mmol/L 138 138 139  Potassium 3.5 - 5.1 mmol/L 3.9 4.2 4.0  Chloride 98 - 111 mmol/L 107 104 105  CO2 22 - 32 mmol/L 23 25 25   Calcium 8.9 - 10.3 mg/dL 9.1 9.6 9.6  Total Protein 6.5 - 8.1 g/dL 7.5 6.6 6.7  Total Bilirubin 0.3 - 1.2 mg/dL 0.3 0.2 0.2  Alkaline Phos 38 - 126 U/L 32(L) - -  AST 15 - 41 U/L 16 16 13   ALT 0 - 44 U/L 12 18 15      RADIOGRAPHIC STUDIES: I have personally reviewed the radiological images as listed and agreed with the findings in the report. DG Ribs Unilateral W/Chest Right  Result Date: 08/27/2020 CLINICAL DATA:  Fall with right rib pain EXAM: RIGHT RIBS AND CHEST - 3+ VIEW COMPARISON:  11/07/2008 FINDINGS: No fracture or other bone lesions are seen involving the ribs. There is no evidence of pneumothorax or pleural effusion. Both lungs are clear. Heart size and mediastinal contours are within normal limits. IMPRESSION: Negative. Electronically Signed   By: Monte Fantasia M.D.   On: 08/27/2020 11:44   DG Lumbar Spine Complete  Result Date: 08/27/2020 CLINICAL DATA:  Diffuse low back pain following a fall. EXAM: LUMBAR SPINE - COMPLETE 4+ VIEW COMPARISON:  Abdomen and pelvis CT dated 12/13/2014. Lumbar spine radiographs dated 04/05/2013. FINDINGS: There is no evidence of lumbar  spine fracture. Alignment is normal. Intervertebral disc spaces are maintained. IMPRESSION: Normal examination. Electronically Signed   By: Claudie Revering M.D.   On: 08/27/2020 11:48   DG Hip Unilat W or Wo Pelvis 2-3 Views Right  Result Date: 08/27/2020 CLINICAL DATA:  Right hip and side pain following a fall. EXAM: DG HIP (WITH OR WITHOUT PELVIS) 2-3V RIGHT COMPARISON:  Abdomen and pelvis CT dated 12/13/2014. FINDINGS: There is no evidence of hip fracture or dislocation. There is no evidence of arthropathy or other focal bone abnormality. IMPRESSION: Normal examination. Electronically Signed   By: Claudie Revering M.D.   On: 08/27/2020 11:46    ASSESSMENT & PLAN:   39 yo with h/o SLE with   1) H/o Positive lupus anticoagulant No phx of VTE H/o 1st trimester miscarriage PLAN: -Discussed patient's most recent labs from 07/02/2020, all values are WNL except for Glucose at 105. -Discussed 04/27/2020 Lupus Anticoagulant is as follows: PTT-LA Screen at 32, dRVVT at 55, PT Mixing Interp is Positive. -Advised pt that antiphospholipid antibody  must be confirmed as persistent with repeat testing after at least three months.  -Advised pt that if the antiphospholipid antibody is persistent would recommend Lovenox throughout pregnancy and for 6 weeks after.  -Advised pt that antiphospholipid antibody increases risk of blood clots and this risk varies depending on if it is primary or secondary to an autoimmune condition. The pt appears to have the latter.  -Advised pt that the better controlled the autoimmune condition, the lower the risk of blood clots.  -Recommend pt switch from estrogen-containing birth control. Recommend pt discuss an IUD with her OBGYN.  -Not unreasonable for pt to continue daily baby ASA for prophylaxis. -Will get labs today -Will see back as needed based on labs    FOLLOW UP: Labs today RTC based on labs  . Orders Placed This Encounter  Procedures  . CBC with  Differential/Platelet    Standing Status:   Future    Number of Occurrences:   1    Standing Expiration Date:   08/31/2021  . CMP ([place] only)    Standing Status:   Future    Number of Occurrences:   1    Standing Expiration Date:   08/31/2021  . Lupus anticoagulant panel    Standing Status:   Future    Number of Occurrences:   1    Standing Expiration Date:   08/31/2021  . Cardiolipin antibodies, IgG, IgM, IgA    Standing Status:   Future    Number of Occurrences:   1    Standing Expiration Date:   08/31/2021  . Beta-2-glycoprotein i abs, IgG/M/A    Standing Status:   Future    Number of Occurrences:   1    Standing Expiration Date:   08/31/2021  . Ferritin    Standing Status:   Future    Number of Occurrences:   1    Standing Expiration Date:   08/31/2021  . Iron and TIBC    Standing Status:   Future    Number of Occurrences:   1    Standing Expiration Date:   08/31/2021     All of the patients questions were answered with apparent satisfaction. The patient knows to call the clinic with any problems, questions or concerns.  I spent 30 mins counseling the patient face to face. The total time spent in the appointment was 45 minutes and more than 50% was on counseling and direct patient cares.    Sullivan Lone MD [place] AAHIVMS [facility] Hematology/Oncology Physician [facility]  (Office):       [phone] (Work cell):  [phone] (Fax):           [phone]  08/31/2020 4:41 PM  I, Yevette Edwards, am acting as a scribe for Dr. Sullivan Lone.   .I have reviewed the above documentation for accuracy and completeness, and I agree with the above. Brunetta Genera MD    ADDENDUM  Lupus anticoagulant -- not detected Neg cardiolipin and beta2 glycoprotein antibodies.  Component     Latest Ref Rng & Units 08/31/2020  WBC     4.0 - 10.5 K/uL 7.0  RBC     3.87 - 5.11 MIL/uL 4.68  Hemoglobin     12.0 - 15.0 g/dL 14.2  HCT     36 - 46 % 41.8    MCV     80.0 - 100.0 fL 89.3  MCH     26.0 - 34.0 pg 30.3  MCHC  30.0 - 36.0 g/dL 34.0  RDW     11.5 - 15.5 % 11.9  Platelets     150 - 400 K/uL 342  nRBC     0.0 - 0.2 % 0.0  Neutrophils     % 57  NEUT#     1.7 - 7.7 K/uL 4.0  Lymphocytes     % 37  Lymphocyte #     0.7 - 4.0 K/uL 2.5  Monocytes Relative     % 5  Monocyte #     0.1 - 1.0 K/uL 0.3  Eosinophil     % 0  Eosinophils Absolute     0.0 - 0.5 K/uL 0.0  Basophil     % 1  Basophils Absolute     0.0 - 0.1 K/uL 0.0  Immature Granulocytes     % 0  Abs Immature Granulocytes     0.00 - 0.07 K/uL 0.02  Sodium     135 - 145 mmol/L 138  Potassium     3.5 - 5.1 mmol/L 3.9  Chloride     98 - 111 mmol/L 107  CO2     22 - 32 mmol/L 23  Glucose     70 - 99 mg/dL 85  BUN     6 - 20 mg/dL 11  Creatinine     0.44 - 1.00 mg/dL 0.73  Calcium     8.9 - 10.3 mg/dL 9.1  Total Protein     6.5 - 8.1 g/dL 7.5  Albumin     3.5 - 5.0 g/dL 4.4  AST     15 - 41 U/L 16  ALT     0 - 44 U/L 12  Alkaline Phosphatase     38 - 126 U/L 32 (L)  Total Bilirubin     0.3 - 1.2 mg/dL 0.3  GFR, Est Non African American     >60 mL/min >60  Anion gap     5 - 15 8  Iron     41 - 142 ug/dL 126  TIBC     236 - 444 ug/dL 463 (H)  Saturation Ratios     21 - 57 % 27  UIBC     120 - 384 ug/dL 337  Ferritin     11 - 307 ng/mL 30   .Brunetta Genera MD

## 2020-09-01 LAB — LUPUS ANTICOAGULANT PANEL
DRVVT: 39 s (ref 0.0–47.0)
PTT Lupus Anticoagulant: 28.9 s (ref 0.0–51.9)

## 2020-09-02 LAB — CARDIOLIPIN ANTIBODIES, IGG, IGM, IGA
Anticardiolipin IgA: <9 APL U/mL (ref 0–11)
Anticardiolipin IgG: <9 GPL U/mL (ref 0–14)
Anticardiolipin IgM: <9 MPL U/mL (ref 0–12)

## 2020-09-02 LAB — BETA-2-GLYCOPROTEIN I ABS, IGG/M/A
Beta-2 Glyco I IgG: <9 GPI IgG units (ref 0–20)
Beta-2-Glycoprotein I IgA: <9 GPI IgA units (ref 0–25)
Beta-2-Glycoprotein I IgM: <9 GPI IgM units (ref 0–32)

## 2020-09-14 ENCOUNTER — Other Ambulatory Visit: Payer: Self-pay | Admitting: Rheumatology

## 2020-09-14 NOTE — Telephone Encounter (Signed)
Last Visit: 06/17/2020 Next Visit: 09/28/2020  Okay to refill per Dr. Estanislado Pandy

## 2020-09-14 NOTE — Progress Notes (Deleted)
Office Visit Note  Patient: Sharon Hartman             Date of Birth: Oct 07, 1981           MRN: [id number]             PCP: Donald Prose, MD Referring: Donald Prose, MD Visit Date: 09/28/2020 Occupation: @GUAROCC @  Subjective:  No chief complaint on file.   History of Present Illness: Sharon Hartman is a 39 y.o. female ***   Activities of Daily Living:  Patient reports morning stiffness for *** {minute/hour:19697}.   Patient {ACTIONS;DENIES/REPORTS:21021675::"Denies"} nocturnal pain.  Difficulty dressing/grooming: {ACTIONS;DENIES/REPORTS:21021675::"Denies"} Difficulty climbing stairs: {ACTIONS;DENIES/REPORTS:21021675::"Denies"} Difficulty getting out of chair: {ACTIONS;DENIES/REPORTS:21021675::"Denies"} Difficulty using hands for taps, buttons, cutlery, and/or writing: {ACTIONS;DENIES/REPORTS:21021675::"Denies"}  No Rheumatology ROS completed.   PMFS History:  Patient Active Problem List   Diagnosis Date Noted  . Tremor 09/12/2019  . Chronic migraine 09/12/2019  . Postpartum care following vaginal delivery (9/9) 06/18/2014  . Post-dates pregnancy 06/17/2014  . Palpitations 11/22/2013  . Syncope 11/22/2013  . Orthostatic hypotension 11/22/2013  . ADHD (attention deficit hyperactivity disorder) 12/04/2011    Past Medical History:  Diagnosis Date  . ADD (attention deficit disorder)   . Anxiety   . Constipation   . DDD (degenerative disc disease), lumbar   . Depression   . Fatty liver   . Galactorrhea    bilateral nipple discharge  . History of basal cell carcinoma (BCC) excision    back 2017 excision  . History of cervical dysplasia    CIN II  s/p LEEP 06/ 2005  . History of suicide attempt    2003; 2006  . History of syncope    02/ 2015  w/ palpitations and pregnant--- evaluation by cardiology, dr Debara Pickett, note in epic 03/ 2015, work-up done w/ normal echo and event monitor showed SR with isolated PVCs/ PACs  . Hyperlipidemia   . Low testosterone   . Low  vitamin D level   . Lupus ([place])   . Migraine   . Numbness    bilateral hands and feet due to raynaurd's disease  . Polyarthralgia   . PONV (postoperative nausea and vomiting)    also lightweight when it comes to anesthesia  . Raynaud's disease    rheumotology--- dr Estanislado Pandy--  with bilateral hand and feet numbness, and nipples per pt;  takes norvasc  . Right lower quadrant pain   . Scoliosis    thoracic  . Sicca syndrome ([place])   . Tremor, unspecified    intermittant left hand/ wrist tremor with dorsiflexion  ( neurology evaluation by dr tat note in epic 08-15-2019, felt to be clonus)    Family History  Problem Relation Age of Onset  . Heart murmur Mother   . Diabetes Mother   . Hepatitis Mother        auto immune   . Breast cancer Paternal Grandmother   . Hypertension Paternal Grandmother   . Hyperlipidemia Paternal Grandmother   . Heart disease Paternal Grandmother   . Hypertension Paternal Grandfather   . Heart disease Paternal Grandfather        CABG  . Heart attack Paternal Grandfather        X4  . Hyperlipidemia Paternal Grandfather   . Hypertension Father   . Hyperlipidemia Father   . Hashimoto's thyroiditis Sister   . Depression Sister   . Breast cancer Sister   . Anxiety disorder Sister   . Scoliosis Brother   . Drug  abuse Brother   . Autism Son    Past Surgical History:  Procedure Laterality Date  . DILATATION & CURETTAGE/HYSTEROSCOPY WITH MYOSURE N/A 08/23/2019   Procedure: DILATATION & CURETTAGE/HYSTEROSCOPY WITH MYOSURE;  Surgeon: Servando Salina, MD;  Location: [facility];  Service: Gynecology;  Laterality: N/A;  . DILATION AND CURETTAGE OF UTERUS  1999  . GANGLION CYST EXCISION Right 2011   foot  . LAPAROSCOPIC LYSIS OF ADHESIONS N/A 12/23/2019   Procedure: DIAGNOSTIC LAPAROSCOPY WITH PERITONEAL BIOPSY;  Surgeon: Servando Salina, MD;  Location: [facility];  Service: Gynecology;  Laterality: N/A;  2 1/2 hrs.  Marland Kitchen  LEEP  03-31-2004   @[facility]    CIN II  . TONSILLECTOMY  1998  . WISDOM TOOTH EXTRACTION  1999   Social History   Social History Narrative   Lives at home with husband and son.   Right-handed.   No caffeine use.   Immunization History  Administered Date(s) Administered  . PPD Test 02/13/2012, 11/14/2017, 12/25/2017  . Tdap 02/13/2012     Objective: Vital Signs: There were no vitals taken for this visit.   Physical Exam   Musculoskeletal Exam: ***  CDAI Exam: CDAI Score: -- Patient Global: --; Provider Global: -- Swollen: --; Tender: -- Joint Exam 09/28/2020   No joint exam has been documented for this visit   There is currently no information documented on the homunculus. Go to the Rheumatology activity and complete the homunculus joint exam.  Investigation: No additional findings.  Imaging: DG Ribs Unilateral W/Chest Right  Result Date: 08/27/2020 CLINICAL DATA:  Fall with right rib pain EXAM: RIGHT RIBS AND CHEST - 3+ VIEW COMPARISON:  11/07/2008 FINDINGS: No fracture or other bone lesions are seen involving the ribs. There is no evidence of pneumothorax or pleural effusion. Both lungs are clear. Heart size and mediastinal contours are within normal limits. IMPRESSION: Negative. Electronically Signed   By: Monte Fantasia M.D.   On: 08/27/2020 11:44   DG Lumbar Spine Complete  Result Date: 08/27/2020 CLINICAL DATA:  Diffuse low back pain following a fall. EXAM: LUMBAR SPINE - COMPLETE 4+ VIEW COMPARISON:  Abdomen and pelvis CT dated 12/13/2014. Lumbar spine radiographs dated 04/05/2013. FINDINGS: There is no evidence of lumbar spine fracture. Alignment is normal. Intervertebral disc spaces are maintained. IMPRESSION: Normal examination. Electronically Signed   By: Claudie Revering M.D.   On: 08/27/2020 11:48   DG Hip Unilat W or Wo Pelvis 2-3 Views Right  Result Date: 08/27/2020 CLINICAL DATA:  Right hip and side pain following a fall. EXAM: DG HIP (WITH OR WITHOUT PELVIS)  2-3V RIGHT COMPARISON:  Abdomen and pelvis CT dated 12/13/2014. FINDINGS: There is no evidence of hip fracture or dislocation. There is no evidence of arthropathy or other focal bone abnormality. IMPRESSION: Normal examination. Electronically Signed   By: Claudie Revering M.D.   On: 08/27/2020 11:46    Recent Labs: Lab Results  Component Value Date   WBC 7.0 08/31/2020   HGB 14.2 08/31/2020   PLT 342 08/31/2020   NA 138 08/31/2020   K 3.9 08/31/2020   CL 107 08/31/2020   CO2 23 08/31/2020   GLUCOSE 85 08/31/2020   BUN 11 08/31/2020   CREATININE 0.73 08/31/2020   BILITOT 0.3 08/31/2020   ALKPHOS 32 (L) 08/31/2020   AST 16 08/31/2020   ALT 12 08/31/2020   PROT 7.5 08/31/2020   ALBUMIN 4.4 08/31/2020   CALCIUM 9.1 08/31/2020   GFRAA 133 07/02/2020  Speciality Comments: PLQ Eye Exam 07/23/2020 WNL @ [facility] Follow up in 1 year  Procedures:  No procedures performed Allergies: Patient has no known allergies.   Assessment / Plan:     Visit Diagnoses: Autoimmune disease ([place])  High risk medication use  Raynaud's disease without gangrene  Sicca syndrome (H[facility])  Other fatigue  Vitamin D deficiency  Trochanteric bursitis of both hips  Other idiopathic scoliosis, thoracic region  DDD (degenerative disc disease), lumbar  Orthostatic hypotension  Anxiety and depression  History of ADHD  Palpitations  Essential tremor  Fatty liver  Orders: No orders of the defined types were placed in this encounter.  No orders of the defined types were placed in this encounter.   Face-to-face time spent with patient was *** minutes. Greater than 50% of time was spent in counseling and coordination of care.  Follow-Up Instructions: No follow-ups on file.   Ofilia Neas, PA-C  Note - This record has been created using Dragon software.  Chart creation errors have been sought, but may not always  have been located. Such creation errors do not reflect on  the  standard of medical care.

## 2020-09-28 ENCOUNTER — Ambulatory Visit: Payer: No Typology Code available for payment source | Admitting: Physician Assistant

## 2020-09-28 DIAGNOSIS — Z8659 Personal history of other mental and behavioral disorders: Secondary | ICD-10-CM

## 2020-09-28 DIAGNOSIS — M5136 Other intervertebral disc degeneration, lumbar region: Secondary | ICD-10-CM

## 2020-09-28 DIAGNOSIS — F419 Anxiety disorder, unspecified: Secondary | ICD-10-CM

## 2020-09-28 DIAGNOSIS — G250 Essential tremor: Secondary | ICD-10-CM

## 2020-09-28 DIAGNOSIS — R5383 Other fatigue: Secondary | ICD-10-CM

## 2020-09-28 DIAGNOSIS — M359 Systemic involvement of connective tissue, unspecified: Secondary | ICD-10-CM

## 2020-09-28 DIAGNOSIS — M3500 Sicca syndrome, unspecified: Secondary | ICD-10-CM

## 2020-09-28 DIAGNOSIS — M4124 Other idiopathic scoliosis, thoracic region: Secondary | ICD-10-CM

## 2020-09-28 DIAGNOSIS — I951 Orthostatic hypotension: Secondary | ICD-10-CM

## 2020-09-28 DIAGNOSIS — R002 Palpitations: Secondary | ICD-10-CM

## 2020-09-28 DIAGNOSIS — E559 Vitamin D deficiency, unspecified: Secondary | ICD-10-CM

## 2020-09-28 DIAGNOSIS — I7300 Raynaud's syndrome without gangrene: Secondary | ICD-10-CM

## 2020-09-28 DIAGNOSIS — K760 Fatty (change of) liver, not elsewhere classified: Secondary | ICD-10-CM

## 2020-09-28 DIAGNOSIS — Z79899 Other long term (current) drug therapy: Secondary | ICD-10-CM

## 2020-09-28 DIAGNOSIS — M7061 Trochanteric bursitis, right hip: Secondary | ICD-10-CM

## 2020-10-07 ENCOUNTER — Other Ambulatory Visit: Payer: Self-pay | Admitting: Physician Assistant

## 2020-10-07 MED ORDER — AMLODIPINE BESYLATE 5 MG PO TABS
ORAL_TABLET | ORAL | 0 refills | Status: DC
Start: 2020-10-07 — End: 2020-12-28

## 2020-10-07 NOTE — Telephone Encounter (Addendum)
Last Visit:06/17/2020 Next Visit: due December 2021. Message sent to the front to schedule patient.  Labs: 08/31/2020 Alk Phos. [place] Exam 07/23/2020 WNL  Current Dose per office note on 06/17/2020: Plaquenil 200 mg 1 tablet by mouth twice daily M-F only  Spoke with patient to verify how she is taking the PLQ and she states one tab one tab daily alternating with 2 tabs daily.  Okay to refill Amlodipine and PLQ?

## 2020-12-01 NOTE — Telephone Encounter (Signed)
error 

## 2020-12-11 ENCOUNTER — Other Ambulatory Visit: Payer: Self-pay | Admitting: Internal Medicine

## 2020-12-14 ENCOUNTER — Other Ambulatory Visit: Payer: Self-pay | Admitting: Physician Assistant

## 2020-12-15 NOTE — Telephone Encounter (Signed)
I LMOM for patient to schedule a follow up appointment.

## 2020-12-15 NOTE — Telephone Encounter (Signed)
Last Visit: 06/17/2020 Next Visit: message sent to front desk to schedule appt Labs: Alkaline 32,  Eye exam: 07/23/2020  Current Dose per office note 06/17/2020, Plaquenil 200 mg 1 tablet by mouth twice daily M-F only (Started after last office visit on 05/06/20).  XI:[id number] disease   Last Fill:  10/07/2020  Okay to refill Plaquenil?

## 2020-12-15 NOTE — Telephone Encounter (Signed)
Please call patient to schedule appt,  She will follow up in 3 months.

## 2020-12-18 NOTE — Progress Notes (Addendum)
Office Visit Note  Patient: Sharon Hartman             Date of Birth: October 28, 1980           MRN: [id number]             PCP: Donald Prose, MD Referring: Donald Prose, MD Visit Date: 12/22/2020 Occupation: @GUAROCC @  Subjective:  Pain in multiple joints   History of Present Illness: Sharon Hartman is a 40 y.o. female with history of autoimmune disease.  She is taking plaquenil 200 mg 1 tablet by mouth twice daily M-F.  She is tolerating Plaquenil without any side effects.  Patient reports that she continues to have frequent flares at which time she experiences significant fatigue, arthralgias, myalgias, low-grade fevers, and facial rashes.  She states that she continues to have chronic pain and intermittent swelling in both hands and both feet.  She has been taking ibuprofen 800 mg 3 times daily for symptomatic relief.  She continues to have intermittent discomfort in her right shoulder as well as trochanter bursitis of the right hip.  She has been performing stretching exercises on a daily basis as well as yoga and using the elliptical for exercise.  She denies any shortness of breath, pleuritic chest pain, or palpitations.  She has not had any oral or nasal ulcerations.  She denies any sicca symptoms.  She denies any swollen lymph nodes recently.  She continues have intermittent symptoms of Raynaud's.  She denies any digital ulcerations.  She takes amlodipine 5 mg 1 tablet by mouth daily, which has been helpful at alleviating some of her symptoms of raynaud's.  Patient reports that she continues to have hives intermittently.  She has been taking Zyrtec at bedtime but woke up the other night with scratches on her skin.  She has not seen an allergist in the past.     Activities of Daily Living:  Patient reports morning stiffness for 30-60 minutes.   Patient Reports nocturnal pain.  Difficulty dressing/grooming: Denies Difficulty climbing stairs: Denies Difficulty getting out of chair:  Denies Difficulty using hands for taps, buttons, cutlery, and/or writing: Denies  Review of Systems  Constitutional: Positive for fatigue.  HENT: Negative for mouth sores, mouth dryness and nose dryness.   Eyes: Negative for pain, itching, visual disturbance and dryness.  Respiratory: Negative for cough, hemoptysis, shortness of breath and difficulty breathing.   Cardiovascular: Negative for chest pain, palpitations and swelling in legs/feet.  Gastrointestinal: Positive for constipation. Negative for abdominal pain, blood in stool and diarrhea.  Endocrine: Negative for increased urination.  Genitourinary: Negative for painful urination.  Musculoskeletal: Positive for arthralgias, joint pain, joint swelling and morning stiffness. Negative for myalgias, muscle weakness, muscle tenderness and myalgias.  Skin: Positive for color change, rash and redness.  Allergic/Immunologic: Negative for susceptible to infections.  Neurological: Positive for headaches and weakness. Negative for dizziness, numbness and memory loss.  Hematological: Negative for swollen glands.  Psychiatric/Behavioral: Positive for sleep disturbance. Negative for confusion.    PMFS History:  Patient Active Problem List   Diagnosis Date Noted  . Tremor 09/12/2019  . Chronic migraine 09/12/2019  . Postpartum care following vaginal delivery (9/9) 06/18/2014  . Post-dates pregnancy 06/17/2014  . Palpitations 11/22/2013  . Syncope 11/22/2013  . Orthostatic hypotension 11/22/2013  . ADHD (attention deficit hyperactivity disorder) 12/04/2011    Past Medical History:  Diagnosis Date  . ADD (attention deficit disorder)   . Anxiety   . Constipation   .  DDD (degenerative disc disease), lumbar   . Depression   . Fatty liver   . Galactorrhea    bilateral nipple discharge  . History of basal cell carcinoma (BCC) excision    back 2017 excision  . History of cervical dysplasia    CIN II  s/p LEEP 06/ 2005  . History of  suicide attempt    2003; 2006  . History of syncope    02/ 2015  w/ palpitations and pregnant--- evaluation by cardiology, dr Debara Pickett, note in epic 03/ 2015, work-up done w/ normal echo and event monitor showed SR with isolated PVCs/ PACs  . Hyperlipidemia   . Low testosterone   . Low vitamin D level   . Lupus ([place])   . Migraine   . Numbness    bilateral hands and feet due to raynaurd's disease  . Polyarthralgia   . PONV (postoperative nausea and vomiting)    also lightweight when it comes to anesthesia  . Raynaud's disease    rheumotology--- dr Estanislado Pandy--  with bilateral hand and feet numbness, and nipples per pt;  takes norvasc  . Right lower quadrant pain   . Scoliosis    thoracic  . Sicca syndrome ([place])   . Tremor, unspecified    intermittant left hand/ wrist tremor with dorsiflexion  ( neurology evaluation by dr tat note in epic 08-15-2019, felt to be clonus)    Family History  Problem Relation Age of Onset  . Heart murmur Mother   . Diabetes Mother   . Hepatitis Mother        auto immune   . Breast cancer Paternal Grandmother   . Hypertension Paternal Grandmother   . Hyperlipidemia Paternal Grandmother   . Heart disease Paternal Grandmother   . Hypertension Paternal Grandfather   . Heart disease Paternal Grandfather        CABG  . Heart attack Paternal Grandfather        X4  . Hyperlipidemia Paternal Grandfather   . Hypertension Father   . Hyperlipidemia Father   . Hashimoto's thyroiditis Sister   . Depression Sister   . Breast cancer Sister   . Anxiety disorder Sister   . Scoliosis Brother   . Drug abuse Brother   . Autism Son    Past Surgical History:  Procedure Laterality Date  . DILATATION & CURETTAGE/HYSTEROSCOPY WITH MYOSURE N/A 08/23/2019   Procedure: DILATATION & CURETTAGE/HYSTEROSCOPY WITH MYOSURE;  Surgeon: Servando Salina, MD;  Location: [facility];  Service: Gynecology;  Laterality: N/A;  . DILATION AND CURETTAGE OF UTERUS   1999  . GANGLION CYST EXCISION Right 2011   foot  . LAPAROSCOPIC LYSIS OF ADHESIONS N/A 12/23/2019   Procedure: DIAGNOSTIC LAPAROSCOPY WITH PERITONEAL BIOPSY;  Surgeon: Servando Salina, MD;  Location: [facility];  Service: Gynecology;  Laterality: N/A;  2 1/2 hrs.  Marland Kitchen LEEP  03-31-2004   @[facility]    CIN II  . TONSILLECTOMY  1998  . WISDOM TOOTH EXTRACTION  1999   Social History   Social History Narrative   Lives at home with husband and son.   Right-handed.   No caffeine use.   Immunization History  Administered Date(s) Administered  . PFIZER(Purple Top)SARS-COV-2 Vaccination 10/22/2019, 11/12/2019, 05/28/2020  . PPD Test 02/13/2012, 11/14/2017, 12/25/2017  . Tdap 02/13/2012     Objective: Vital Signs: BP 120/80 (BP Location: Left Arm, Patient Position: Sitting, Cuff Size: Normal)   Pulse 86   Ht 5\' 6"  (1.676 m)   Wt  171 lb 12.8 oz (77.9 kg)   BMI 27.73 kg/m    Physical Exam Vitals and nursing note reviewed.  Constitutional:      Appearance: She is well-developed.  HENT:     Head: Normocephalic and atraumatic.  Eyes:     Conjunctiva/sclera: Conjunctivae normal.  Pulmonary:     Effort: Pulmonary effort is normal.  Abdominal:     Palpations: Abdomen is soft.  Musculoskeletal:     Cervical back: Normal range of motion.  Skin:    General: Skin is warm and dry.     Capillary Refill: Capillary refill takes less than 2 seconds.  Neurological:     Mental Status: She is alert and oriented to person, place, and time.  Psychiatric:        Behavior: Behavior normal.      Musculoskeletal Exam: C-spine, thoracic spine, lumbar spine have good range of motion with no discomfort.  Shoulder joints, elbow joints, wrist joints, MCPs, PIPs, DIPs have good range of motion with no synovitis.  She is able to make a complete fist bilaterally.  Right hip is slightly limited range of motion.  Tenderness over trochanteric bursa of right hip.  Knee joints have good range of  motion with no warmth or effusion.  Ankle joints have good range of motion with no tenderness or inflammation.  No tenderness over MTP joints.  No tenderness along the plantar fascia or Achilles tendons.   CDAI Exam: CDAI Score: -- Patient Global: --; Provider Global: -- Swollen: --; Tender: -- Joint Exam 12/22/2020   No joint exam has been documented for this visit   There is currently no information documented on the homunculus. Go to the Rheumatology activity and complete the homunculus joint exam.  Investigation: No additional findings.  Imaging: No results found.  Recent Labs: Lab Results  Component Value Date   WBC 7.0 08/31/2020   HGB 14.2 08/31/2020   PLT 342 08/31/2020   NA 138 08/31/2020   K 3.9 08/31/2020   CL 107 08/31/2020   CO2 23 08/31/2020   GLUCOSE 85 08/31/2020   BUN 11 08/31/2020   CREATININE 0.73 08/31/2020   BILITOT 0.3 08/31/2020   ALKPHOS 32 (L) 08/31/2020   AST 16 08/31/2020   ALT 12 08/31/2020   PROT 7.5 08/31/2020   ALBUMIN 4.4 08/31/2020   CALCIUM 9.1 08/31/2020   GFRAA 133 07/02/2020    Speciality Comments: PLQ Eye Exam 07/23/2020 WNL @ [facility] Follow up in 1 year  Procedures:  No procedures performed Allergies: Patient has no known allergies.   Assessment / Plan:     Visit Diagnoses: Autoimmune disease ([place]) - Lupus anticoagulant detected on 04/27/2020.  AVISE index negative: -2.8.  Anti-C1q IgG is positive, rest of panel negative on 05/15/20: She continues to experience intermittent fatigue, arthralgias, myalgias, low-grade fevers, facial rashes, and intermittent symptoms of Raynaud's.  She is currently taking Plaquenil 200 mg 1 tablet by mouth twice daily Monday through Friday.  She has been tolerating Plaquenil and has not missed any doses recently.  She is been experiencing increased pain in both hands and both feet has noticed intermittent joint swelling.  She has been taking ibuprofen 800 mg 3 times daily for pain  relief.  She had no synovitis on examination today.  She has no digital ulcerations or signs of gangrene on exam.  She continues to take Norvasc 5 mg 1 tablet by mouth daily which has helped to alleviate her symptoms of Raynaud's.  She has  not had any oral or nasal ulcerations.  She is not experiencing any sicca symptoms.  She denies any pleuritic chest pain, shortness of breath, or palpitations.  X-rays of both hands and feet were updated today.  We also obtain the following lab work.  We discussed that if the pain in her hands persists or worsens we will schedule an ultrasound of both hands to assess for synovitis. She will continue on the current dose of plaquenil. She will follow up in 5 months or sooner if she develops any new or worsening symptoms.- Plan: Urinalysis, Routine w reflex microscopic, Anti-DNA antibody, double-stranded, C3 and C4, Sedimentation rate  High risk medication use - Plaquenil 200 mg 1 tablet by mouth twice daily M-F only (Started after office visit on 05/06/20). PLQ Eye Exam 07/23/2020.  CBC and CMP drawn on 08/31/2020.  Orders for CBC and CMP were released today.- Plan: CBC with Differential/Platelet, COMPLETE METABOLIC PANEL WITH GFR  Raynaud's disease without gangrene - History of severe Raynauds. +Lupus anticoagulant: She continues to have intermittent symptoms of Raynaud's but her symptoms have been more mild since starting on amlodipine 5 mg 1 tablet by mouth daily.  No signs of sclerodactyly were noted.  No digital ulcerations or signs of gangrene were noted.  Pain in both hands - She has ongoing pain and intermittent inflammation in both hands.  Her discomfort is typically in the MCP joints, but she has no joint tenderness or synovitis on exam today.  She has been taking ibuprofen 800 mg 3 times a day for pain relief.  X-rays of both hands were updated today.  We discussed that if her pain persists or worsens we will schedule an ultrasound of both hands to assess for  synovitis.  She will remain on Plaquenil as prescribed.  Plan: XR Hand 2 View Left, XR Hand 2 View Right  Pain in both feet -She has been experiencing increased pain and intermittent inflammation in both feet. She has good ROM of both ankle joints with no tenderness or inflammation on exam.  No tenderness over MTP joints.  X-rays of both feet were obtained today.  Plan: XR Foot 2 Views Left, XR Foot 2 Views Right  Sicca syndrome ([place]): She has not been experiencing any sicca symptoms.  Other fatigue: Chronic.  She has been practicing yoga and exercising on the elliptical on a regular basis.   Trochanteric bursitis of both hips: Improved.  She has mild tenderness to palpation.  She has started to perform stretching exercises and yoga on a regular basis, which has alleviated her discomfort.   Other idiopathic scoliosis, thoracic region: Intermittent discomfort  DDD (degenerative disc disease), lumbar: She experiences intermittent lower back discomfort.   Chronic urticaria: Has experienced intermittent urticaria since she was a teenager.  She continues to experience intermittent rashes.  The other night she woke up and had scratches all over her legs due to diffuse skin itching and irritation.  She has been taking Zyrtec at bedtime but has not noticed any improvement in her symptoms.  She will be referred to an allergist for further evaluation.  Other medical conditions are listed as follows:   Orthostatic hypotension  Anxiety and depression  Palpitations  History of ADHD  Essential tremor  Vitamin D deficiency  Fatty liver    Orders: Orders Placed This Encounter  Procedures  . XR Hand 2 View Left  . XR Hand 2 View Right  . XR Foot 2 Views Left  . XR Foot 2  Views Right  . CBC with Differential/Platelet  . COMPLETE METABOLIC PANEL WITH GFR  . Urinalysis, Routine w reflex microscopic  . Anti-DNA antibody, double-stranded  . C3 and C4  . Sedimentation rate  . Ambulatory  referral to Allergy   No orders of the defined types were placed in this encounter.     Follow-Up Instructions: Return in about 5 months (around 05/24/2021) for Autoimmune Disease.   Ofilia Neas, PA-C  Note - This record has been created using Dragon software.  Chart creation errors have been sought, but may not always  have been located. Such creation errors do not reflect on  the standard of medical care.

## 2020-12-22 ENCOUNTER — Ambulatory Visit: Payer: Self-pay

## 2020-12-22 ENCOUNTER — Encounter: Payer: Self-pay | Admitting: Physician Assistant

## 2020-12-22 ENCOUNTER — Ambulatory Visit (INDEPENDENT_AMBULATORY_CARE_PROVIDER_SITE_OTHER): Payer: No Typology Code available for payment source | Admitting: Physician Assistant

## 2020-12-22 ENCOUNTER — Other Ambulatory Visit: Payer: Self-pay

## 2020-12-22 VITALS — BP 120/80 | HR 86 | Ht 66.0 in | Wt 171.8 lb

## 2020-12-22 DIAGNOSIS — K760 Fatty (change of) liver, not elsewhere classified: Secondary | ICD-10-CM

## 2020-12-22 DIAGNOSIS — F32A Depression, unspecified: Secondary | ICD-10-CM

## 2020-12-22 DIAGNOSIS — M79642 Pain in left hand: Secondary | ICD-10-CM

## 2020-12-22 DIAGNOSIS — R5383 Other fatigue: Secondary | ICD-10-CM

## 2020-12-22 DIAGNOSIS — M79672 Pain in left foot: Secondary | ICD-10-CM

## 2020-12-22 DIAGNOSIS — M3500 Sicca syndrome, unspecified: Secondary | ICD-10-CM | POA: Diagnosis not present

## 2020-12-22 DIAGNOSIS — Z8659 Personal history of other mental and behavioral disorders: Secondary | ICD-10-CM

## 2020-12-22 DIAGNOSIS — M7061 Trochanteric bursitis, right hip: Secondary | ICD-10-CM

## 2020-12-22 DIAGNOSIS — M359 Systemic involvement of connective tissue, unspecified: Secondary | ICD-10-CM

## 2020-12-22 DIAGNOSIS — L508 Other urticaria: Secondary | ICD-10-CM

## 2020-12-22 DIAGNOSIS — M79641 Pain in right hand: Secondary | ICD-10-CM

## 2020-12-22 DIAGNOSIS — M79671 Pain in right foot: Secondary | ICD-10-CM

## 2020-12-22 DIAGNOSIS — M4124 Other idiopathic scoliosis, thoracic region: Secondary | ICD-10-CM

## 2020-12-22 DIAGNOSIS — I7300 Raynaud's syndrome without gangrene: Secondary | ICD-10-CM | POA: Diagnosis not present

## 2020-12-22 DIAGNOSIS — Z79899 Other long term (current) drug therapy: Secondary | ICD-10-CM

## 2020-12-22 DIAGNOSIS — G250 Essential tremor: Secondary | ICD-10-CM

## 2020-12-22 DIAGNOSIS — M5136 Other intervertebral disc degeneration, lumbar region: Secondary | ICD-10-CM

## 2020-12-22 DIAGNOSIS — I951 Orthostatic hypotension: Secondary | ICD-10-CM

## 2020-12-22 DIAGNOSIS — F419 Anxiety disorder, unspecified: Secondary | ICD-10-CM

## 2020-12-22 DIAGNOSIS — E559 Vitamin D deficiency, unspecified: Secondary | ICD-10-CM

## 2020-12-22 DIAGNOSIS — R002 Palpitations: Secondary | ICD-10-CM

## 2020-12-22 DIAGNOSIS — M7062 Trochanteric bursitis, left hip: Secondary | ICD-10-CM

## 2020-12-22 NOTE — Addendum Note (Signed)
Addended by: Ofilia Neas on: 12/22/2020 11:58 AM   Modules accepted: Orders

## 2020-12-22 NOTE — Progress Notes (Signed)
Please call the patient to review x-ray results.   X-rays of both hands and both feet were consistent with early osteoarthritis.  No erosive changes were noted.

## 2020-12-23 LAB — COMPLETE METABOLIC PANEL WITH GFR
AG Ratio: 1.9 (calc) (ref 1.0–2.5)
ALT: 17 U/L (ref 6–29)
AST: 18 U/L (ref 10–30)
Albumin: 4.9 g/dL (ref 3.6–5.1)
Alkaline phosphatase (APISO): 25 U/L — ABNORMAL LOW (ref 31–125)
BUN: 12 mg/dL (ref 7–25)
CO2: 29 mmol/L (ref 20–32)
Calcium: 10.7 mg/dL — ABNORMAL HIGH (ref 8.6–10.2)
Chloride: 101 mmol/L (ref 98–110)
Creat: 0.69 mg/dL (ref 0.50–1.10)
GFR, Est African American: 127 mL/min/{1.73_m2} (ref >=60)
GFR, Est Non African American: 110 mL/min/{1.73_m2} (ref >=60)
Globulin: 2.6 g/dL (calc) (ref 1.9–3.7)
Glucose, Bld: 90 mg/dL (ref 65–99)
Potassium: 4.1 mmol/L (ref 3.5–5.3)
Sodium: 139 mmol/L (ref 135–146)
Total Bilirubin: 0.4 mg/dL (ref 0.2–1.2)
Total Protein: 7.5 g/dL (ref 6.1–8.1)

## 2020-12-23 LAB — URINALYSIS, ROUTINE W REFLEX MICROSCOPIC
Bilirubin Urine: NEGATIVE
Glucose, UA: NEGATIVE
Hgb urine dipstick: NEGATIVE
Ketones, ur: NEGATIVE
Leukocytes,Ua: NEGATIVE
Nitrite: NEGATIVE
Protein, ur: NEGATIVE
Specific Gravity, Urine: 1.005 (ref 1.001–1.03)
pH: 6.5 (ref 5.0–8.0)

## 2020-12-23 LAB — CBC WITH DIFFERENTIAL/PLATELET
Absolute Monocytes: 426 cells/uL (ref 200–950)
Basophils Absolute: 57 cells/uL (ref 0–200)
Basophils Relative: 0.7 %
Eosinophils Absolute: 33 cells/uL (ref 15–500)
Eosinophils Relative: 0.4 %
HCT: 44 % (ref 35.0–45.0)
Hemoglobin: 14.8 g/dL (ref 11.7–15.5)
Lymphs Abs: 3805 cells/uL (ref 850–3900)
MCH: 31.2 pg (ref 27.0–33.0)
MCHC: 33.6 g/dL (ref 32.0–36.0)
MCV: 92.8 fL (ref 80.0–100.0)
MPV: 10.4 fL (ref 7.5–12.5)
Monocytes Relative: 5.2 %
Neutro Abs: 3879 cells/uL (ref 1500–7800)
Neutrophils Relative %: 47.3 %
Platelets: 365 10*3/uL (ref 140–400)
RBC: 4.74 10*6/uL (ref 3.80–5.10)
RDW: 11.7 % (ref 11.0–15.0)
Total Lymphocyte: 46.4 %
WBC: 8.2 10*3/uL (ref 3.8–10.8)

## 2020-12-23 LAB — C3 AND C4
C3 Complement: 122 mg/dL (ref 83–193)
C4 Complement: 25 mg/dL (ref 15–57)

## 2020-12-23 LAB — SEDIMENTATION RATE: Sed Rate: 2 mm/h (ref 0–20)

## 2020-12-23 LAB — ANTI-DNA ANTIBODY, DOUBLE-STRANDED: ds DNA Ab: <1 IU/mL

## 2020-12-23 NOTE — Progress Notes (Signed)
Calcium is elevated-10.7.  Please clarify if she has been taking a calcium and vitamin D supplement.  Please advise her to avoid excessive dietary calcium intake and a supplement.  Alk pho is borderline low.   Please forward lab work to PCP.    CBC WNL.  ESR and complements WNL.  dsDNA negative. UA normal.

## 2020-12-25 ENCOUNTER — Other Ambulatory Visit: Payer: Self-pay | Admitting: Physician Assistant

## 2020-12-25 NOTE — Telephone Encounter (Signed)
Next Visit: 05/28/2021  Last Visit: 12/22/2020  Last Fill: 10/07/2020  Dx: Autoimmune disease   Current Dose per office note on 12/22/2020, amlodipine 5 mg 1 tablet by mouth daily  Okay to refill Norvasc?

## 2021-02-11 ENCOUNTER — Ambulatory Visit: Payer: Commercial Managed Care - PPO | Admitting: Allergy

## 2021-03-22 ENCOUNTER — Other Ambulatory Visit: Payer: Self-pay | Admitting: Physician Assistant

## 2021-03-22 NOTE — Telephone Encounter (Signed)
Next Visit: 05/28/2021   Last Visit: 12/22/2020  Current Dose per office note on 12/22/2020, amlodipine 5 mg 1 tablet by mouth daily   Okay to refill Norvasc?

## 2021-04-08 DIAGNOSIS — Z6826 Body mass index (BMI) 26.0-26.9, adult: Secondary | ICD-10-CM | POA: Diagnosis not present

## 2021-04-08 DIAGNOSIS — Z01419 Encounter for gynecological examination (general) (routine) without abnormal findings: Secondary | ICD-10-CM | POA: Diagnosis not present

## 2021-04-16 DIAGNOSIS — Z30430 Encounter for insertion of intrauterine contraceptive device: Secondary | ICD-10-CM | POA: Diagnosis not present

## 2021-04-16 DIAGNOSIS — Z3202 Encounter for pregnancy test, result negative: Secondary | ICD-10-CM | POA: Diagnosis not present

## 2021-04-17 ENCOUNTER — Other Ambulatory Visit: Payer: Self-pay | Admitting: Physician Assistant

## 2021-04-19 NOTE — Telephone Encounter (Signed)
Last Visit: 12/22/2020 Next Visit: 05/28/2021 Labs: 12/22/2020 Calcium is elevated-10.7. Alk pho is borderline low.   CBC WNL.  ESR and complements WNL.  dsDNA negative. UA normal.  Eye exam: 07/23/2020   Current Dose per office note on 12/22/2020: Plaquenil 200 mg 1 tablet by mouth twice daily M-F only DX: Autoimmune disease  Last Fill: 12/15/2020  Okay to refill Plaquenil?

## 2021-04-26 DIAGNOSIS — M545 Low back pain, unspecified: Secondary | ICD-10-CM | POA: Diagnosis not present

## 2021-04-26 DIAGNOSIS — G43009 Migraine without aura, not intractable, without status migrainosus: Secondary | ICD-10-CM | POA: Diagnosis not present

## 2021-04-26 DIAGNOSIS — Z79899 Other long term (current) drug therapy: Secondary | ICD-10-CM | POA: Diagnosis not present

## 2021-05-14 DIAGNOSIS — Z30431 Encounter for routine checking of intrauterine contraceptive device: Secondary | ICD-10-CM | POA: Diagnosis not present

## 2021-05-14 DIAGNOSIS — N939 Abnormal uterine and vaginal bleeding, unspecified: Secondary | ICD-10-CM | POA: Diagnosis not present

## 2021-05-14 NOTE — Progress Notes (Signed)
Office Visit Note  Patient: Sharon Hartman             Date of Birth: June 16, 1981           MRN: [id number]             PCP: Carol Ada, MD Referring: Donald Prose, MD Visit Date: 05/28/2021 Occupation: '@GUAROCC'$ @  Subjective:  Medication monitoring   History of Present Illness: Sharon Hartman is a 40 y.o. female with a history of autoimmune disease.  She states she continues to have sicca symptoms and Raynaud's.  She has not had any increased joint pain recently.  She has some lower back pain and also trochanteric bursa discomfort off and on.  She states right trochanteric bursitis more painful than the left.  Her tremors have improved since she has been on gabapentin.  Activities of Daily Living:  Patient reports morning stiffness for 0 minutes.   Patient Denies nocturnal pain.  Difficulty dressing/grooming: Denies Difficulty climbing stairs: Denies Difficulty getting out of chair: Denies Difficulty using hands for taps, buttons, cutlery, and/or writing: Denies  Review of Systems  Constitutional:  Positive for fatigue.  HENT:  Positive for mouth dryness and nose dryness. Negative for mouth sores.   Eyes:  Negative for pain, itching and dryness.  Respiratory:  Negative for shortness of breath and difficulty breathing.   Cardiovascular:  Negative for chest pain and palpitations.  Gastrointestinal:  Negative for blood in stool, constipation and diarrhea.  Endocrine: Negative for increased urination.  Genitourinary:  Negative for difficulty urinating.  Musculoskeletal:  Positive for joint pain and joint pain. Negative for joint swelling, myalgias, morning stiffness, muscle tenderness and myalgias.  Skin:  Positive for color change and rash.  Allergic/Immunologic: Negative for susceptible to infections.  Neurological:  Positive for dizziness, numbness and headaches. Negative for memory loss and weakness.  Hematological:  Positive for bruising/bleeding tendency.   Psychiatric/Behavioral:  Negative for confusion.    PMFS History:  Patient Active Problem List   Diagnosis Date Noted   Tremor 09/12/2019   Chronic migraine 09/12/2019   Postpartum care following vaginal delivery (9/9) 06/18/2014   Post-dates pregnancy 06/17/2014   Palpitations 11/22/2013   Syncope 11/22/2013   Orthostatic hypotension 11/22/2013   ADHD (attention deficit hyperactivity disorder) 12/04/2011    Past Medical History:  Diagnosis Date   ADD (attention deficit disorder)    Anxiety    Constipation    DDD (degenerative disc disease), lumbar    Depression    Fatty liver    Galactorrhea    bilateral nipple discharge   History of basal cell carcinoma (BCC) excision    back 2017 excision   History of cervical dysplasia    CIN II  s/p LEEP 06/ 2005   History of suicide attempt    2003; 2006   History of syncope    02/ 2015  w/ palpitations and pregnant--- evaluation by cardiology, dr Debara Pickett, note in epic 03/ 2015, work-up done w/ normal echo and event monitor showed SR with isolated PVCs/ PACs   Hyperlipidemia    Low testosterone    Low vitamin D level    Lupus ([place])    Migraine    Numbness    bilateral hands and feet due to raynaurd's disease   Polyarthralgia    PONV (postoperative nausea and vomiting)    also lightweight when it comes to anesthesia   Raynaud's disease    rheumotology--- dr Estanislado Pandy--  with bilateral hand and feet numbness,  and nipples per pt;  takes norvasc   Right lower quadrant pain    Scoliosis    thoracic   Sicca syndrome (HCC)    Tremor, unspecified    intermittant left hand/ wrist tremor with dorsiflexion  ( neurology evaluation by dr tat note in epic 08-15-2019, felt to be clonus)    Family History  Problem Relation Age of Onset   Heart murmur Mother    Diabetes Mother    Hepatitis Mother        auto immune    Breast cancer Paternal Grandmother    Hypertension Paternal Grandmother    Hyperlipidemia Paternal Grandmother     Heart disease Paternal Grandmother    Hypertension Paternal Grandfather    Heart disease Paternal Grandfather        CABG   Heart attack Paternal Grandfather        X4   Hyperlipidemia Paternal Grandfather    Hypertension Father    Hyperlipidemia Father    Hashimoto's thyroiditis Sister    Depression Sister    Breast cancer Sister    Anxiety disorder Sister    Scoliosis Brother    Drug abuse Brother    Autism Son    Past Surgical History:  Procedure Laterality Date   DILATATION & CURETTAGE/HYSTEROSCOPY WITH MYOSURE N/A 08/23/2019   Procedure: DILATATION & CURETTAGE/HYSTEROSCOPY WITH MYOSURE;  Surgeon: Servando Salina, MD;  Location: [place];  Service: Gynecology;  Laterality: N/A;   DILATION AND CURETTAGE OF UTERUS  1999   GANGLION CYST EXCISION Right 2011   foot   LAPAROSCOPIC LYSIS OF ADHESIONS N/A 12/23/2019   Procedure: DIAGNOSTIC LAPAROSCOPY WITH PERITONEAL BIOPSY;  Surgeon: Servando Salina, MD;  Location: [place];  Service: Gynecology;  Laterality: N/A;  2 1/2 hrs.   LEEP  03-31-2004   '@[facility]'$    CIN II   TONSILLECTOMY  1998   WISDOM TOOTH EXTRACTION  1999   Social History   Social History Narrative   Lives at home with husband and son.   Right-handed.   No caffeine use.   Immunization History  Administered Date(s) Administered   PFIZER(Purple Top)SARS-COV-2 Vaccination 10/22/2019, 11/12/2019, 05/28/2020   PPD Test 02/13/2012, 11/14/2017, 12/25/2017   Tdap 02/13/2012     Objective: Vital Signs: BP 105/72 (BP Location: Left Arm, Patient Position: Sitting, Cuff Size: Normal)   Pulse 92   Ht '5\' 6"'$  (1.676 m)   Wt 166 lb 3.2 oz (75.4 kg)   BMI 26.83 kg/m    Physical Exam Vitals and nursing note reviewed.  Constitutional:      Appearance: She is well-developed.  HENT:     Head: Normocephalic and atraumatic.  Eyes:     Conjunctiva/sclera: Conjunctivae normal.  Cardiovascular:     Rate and Rhythm: Normal rate and  regular rhythm.     Heart sounds: Normal heart sounds.  Pulmonary:     Effort: Pulmonary effort is normal.     Breath sounds: Normal breath sounds.  Abdominal:     General: Bowel sounds are normal.     Palpations: Abdomen is soft.  Musculoskeletal:     Cervical back: Normal range of motion.  Lymphadenopathy:     Cervical: No cervical adenopathy.  Skin:    General: Skin is warm and dry.     Capillary Refill: Capillary refill takes less than 2 seconds.     Comments: No nailbed capillary changes, sclerodactyly or digital ulcers were noted.  She had good capillary refill.  Neurological:  Mental Status: She is alert and oriented to person, place, and time.  Psychiatric:        Behavior: Behavior normal.     Musculoskeletal Exam: C-spine was in good range of motion.  Shoulder joints, elbow joints, wrist joints, MCPs PIPs and DIPs with good range of motion with no synovitis.  Hip joints, knee joints, ankles, MTPs and PIPs with good range of motion with no synovitis.  She had mild tenderness over right trochanteric bursa.  CDAI Exam: CDAI Score: -- Patient Global: --; Provider Global: -- Swollen: --; Tender: -- Joint Exam 05/28/2021   No joint exam has been documented for this visit   There is currently no information documented on the homunculus. Go to the Rheumatology activity and complete the homunculus joint exam.  Investigation: No additional findings.  Imaging: No results found.  Recent Labs: Lab Results  Component Value Date   WBC 8.2 12/22/2020   HGB 14.8 12/22/2020   PLT 365 12/22/2020   NA 139 12/22/2020   K 4.1 12/22/2020   CL 101 12/22/2020   CO2 29 12/22/2020   GLUCOSE 90 12/22/2020   BUN 12 12/22/2020   CREATININE 0.69 12/22/2020   BILITOT 0.4 12/22/2020   ALKPHOS 32 (L) 08/31/2020   AST 18 12/22/2020   ALT 17 12/22/2020   PROT 7.5 12/22/2020   ALBUMIN 4.4 08/31/2020   CALCIUM 10.7 (H) 12/22/2020   GFRAA 127 12/22/2020    Speciality Comments:  PLQ Eye Exam 07/23/2020 WNL @ [facility] Follow up in 1 year  Procedures:  No procedures performed Allergies: Patient has no known allergies.   Assessment / Plan:     Visit Diagnoses: Autoimmune disease ([place]) - Lupus anticoagulant detected on 04/27/2020.  AVISE index negative: -2.8.  Anti-C1q IgG is positive, rest of panel negative on 05/15/20:  -She is clinically doing well.  She has mild sicca symptoms.  She continues to have some fatigue and Raynaud's phenomenon.  Overall her symptoms are fairly well controlled.  We will check labs today.  Plan: Urinalysis, Routine w reflex microscopic, Sedimentation rate, C3 and C4, Anti-DNA antibody, double-stranded, Lupus Anticoagulant Eval w/Reflex  High risk medication use - Plaquenil 200 mg 1 tablet by mouth twice daily M-F only (Started after office visit on 05/06/20). PLQ Eye Exam 07/23/2020. - Plan: CBC with Differential/Platelet, COMPLETE METABOLIC PANEL WITH GFR, Hydroxychloroquine, Serum/Plasma today.  Her last dose of Plaquenil was last night.  Raynaud's disease without gangrene - History of severe Raynauds. +Lupus anticoagulant: Raynauds is controlled with a combination of hydroxychloroquine and amlodipine.  Her symptoms are better during the summer months.  Sicca syndrome (HCC)-over-the-counter products were discussed.  Other fatigue-she continues to have some fatigue although her fatigue has improved.  Trochanteric bursitis of both hips-IT band stretches were emphasized.  She had mild tenderness over right trochanteric bursa.  Other idiopathic scoliosis, thoracic region  DDD (degenerative disc disease), lumbar-she continues to have some lower back pain.  Core strengthening exercises were discussed.  Orthostatic hypotension  Anxiety and depression  History of ADHD  Essential tremor-improved on gabapentin.  Vitamin D deficiency -she had history of vitamin D deficiency in the past.  Plan: VITAMIN D 25 Hydroxy (Vit-D  Deficiency, Fractures)  Fatty liver-LFTs has been normal.  Orders: Orders Placed This Encounter  Procedures   CBC with Differential/Platelet   COMPLETE METABOLIC PANEL WITH GFR   Urinalysis, Routine w reflex microscopic   Sedimentation rate   C3 and C4   Anti-DNA antibody, double-stranded  Hydroxychloroquine, Serum/Plasma   Lupus Anticoagulant Eval w/Reflex   VITAMIN D 25 Hydroxy (Vit-D Deficiency, Fractures)   No orders of the defined types were placed in this encounter.    Follow-Up Instructions: Return in about 5 months (around 10/28/2021) for Autoimmune disease.   Bo Merino, MD  Note - This record has been created using Editor, commissioning.  Chart creation errors have been sought, but may not always  have been located. Such creation errors do not reflect on  the standard of medical care.

## 2021-05-20 DIAGNOSIS — F900 Attention-deficit hyperactivity disorder, predominantly inattentive type: Secondary | ICD-10-CM | POA: Diagnosis not present

## 2021-05-20 DIAGNOSIS — E7800 Pure hypercholesterolemia, unspecified: Secondary | ICD-10-CM | POA: Diagnosis not present

## 2021-05-20 DIAGNOSIS — F419 Anxiety disorder, unspecified: Secondary | ICD-10-CM | POA: Diagnosis not present

## 2021-05-20 DIAGNOSIS — F5101 Primary insomnia: Secondary | ICD-10-CM | POA: Diagnosis not present

## 2021-05-28 ENCOUNTER — Ambulatory Visit (INDEPENDENT_AMBULATORY_CARE_PROVIDER_SITE_OTHER): Payer: BC Managed Care – PPO | Admitting: Rheumatology

## 2021-05-28 ENCOUNTER — Encounter: Payer: Self-pay | Admitting: Rheumatology

## 2021-05-28 ENCOUNTER — Other Ambulatory Visit: Payer: Self-pay

## 2021-05-28 VITALS — BP 105/72 | HR 92 | Ht 66.0 in | Wt 166.2 lb

## 2021-05-28 DIAGNOSIS — M3500 Sicca syndrome, unspecified: Secondary | ICD-10-CM

## 2021-05-28 DIAGNOSIS — F419 Anxiety disorder, unspecified: Secondary | ICD-10-CM

## 2021-05-28 DIAGNOSIS — E559 Vitamin D deficiency, unspecified: Secondary | ICD-10-CM | POA: Diagnosis not present

## 2021-05-28 DIAGNOSIS — K760 Fatty (change of) liver, not elsewhere classified: Secondary | ICD-10-CM

## 2021-05-28 DIAGNOSIS — Z79899 Other long term (current) drug therapy: Secondary | ICD-10-CM | POA: Diagnosis not present

## 2021-05-28 DIAGNOSIS — I951 Orthostatic hypotension: Secondary | ICD-10-CM

## 2021-05-28 DIAGNOSIS — M7061 Trochanteric bursitis, right hip: Secondary | ICD-10-CM

## 2021-05-28 DIAGNOSIS — R5383 Other fatigue: Secondary | ICD-10-CM

## 2021-05-28 DIAGNOSIS — I7300 Raynaud's syndrome without gangrene: Secondary | ICD-10-CM

## 2021-05-28 DIAGNOSIS — M7062 Trochanteric bursitis, left hip: Secondary | ICD-10-CM

## 2021-05-28 DIAGNOSIS — G250 Essential tremor: Secondary | ICD-10-CM

## 2021-05-28 DIAGNOSIS — Z8659 Personal history of other mental and behavioral disorders: Secondary | ICD-10-CM

## 2021-05-28 DIAGNOSIS — M359 Systemic involvement of connective tissue, unspecified: Secondary | ICD-10-CM

## 2021-05-28 DIAGNOSIS — F32A Depression, unspecified: Secondary | ICD-10-CM

## 2021-05-28 DIAGNOSIS — M5136 Other intervertebral disc degeneration, lumbar region: Secondary | ICD-10-CM

## 2021-05-28 DIAGNOSIS — M4124 Other idiopathic scoliosis, thoracic region: Secondary | ICD-10-CM

## 2021-05-28 NOTE — Patient Instructions (Signed)
Vaccines You are taking a medication(s) that can suppress your immune system.  The following immunizations are recommended: Flu annually Covid-19  Td/Tdap (tetanus, diphtheria, pertussis) every 10 years Pneumonia (Prevnar 15 then Pneumovax 23 at least 1 year apart.  Alternatively, can take Prevnar 20 without needing additional dose) Shingrix (after age 39): 2 doses from 4 weeks to 6 months apart  Please check with your PCP to make sure you are up to date.  Heart Disease Prevention   Your inflammatory disease increases your risk of heart disease which includes heart attack, stroke, atrial fibrillation (irregular heartbeats), high blood pressure, heart failure and atherosclerosis (plaque in the arteries).  It is important to reduce your risk by:   Keep blood pressure, cholesterol, and blood sugar at healthy levels   Smoking Cessation   Maintain a healthy weight  BMI 20-25   Eat a healthy diet  Plenty of fresh fruit, vegetables, and whole grains  Limit saturated fats, foods high in sodium, and added sugars  DASH and Mediterranean diet   Increase physical activity  Recommend moderate physically activity for 150 minutes per week/ 30 minutes a day for five days a week These can be broken up into three separate ten-minute sessions during the day.   Reduce Stress  Meditation, slow breathing exercises, yoga, coloring books  Dental visits twice a year

## 2021-06-04 NOTE — Progress Notes (Signed)
CBC and CMP are normal.  UA is negative, sed rate is normal, complements are normal, double-stranded DNA is negative.  Lupus anticoagulant is negative.  Vitamin D is low normal at 32.  Hydroxychloroquine level is pending.

## 2021-06-07 LAB — URINALYSIS, ROUTINE W REFLEX MICROSCOPIC
Bilirubin Urine: NEGATIVE
Glucose, UA: NEGATIVE
Hgb urine dipstick: NEGATIVE
Ketones, ur: NEGATIVE
Leukocytes,Ua: NEGATIVE
Nitrite: NEGATIVE
Protein, ur: NEGATIVE
Specific Gravity, Urine: 1.007 (ref 1.001–1.035)
pH: 6 (ref 5.0–8.0)

## 2021-06-07 LAB — COMPLETE METABOLIC PANEL WITH GFR
AG Ratio: 2.3 (calc) (ref 1.0–2.5)
ALT: 15 U/L (ref 6–29)
AST: 15 U/L (ref 10–30)
Albumin: 4.3 g/dL (ref 3.6–5.1)
Alkaline phosphatase (APISO): 32 U/L (ref 31–125)
BUN: 10 mg/dL (ref 7–25)
CO2: 24 mmol/L (ref 20–32)
Calcium: 9.1 mg/dL (ref 8.6–10.2)
Chloride: 107 mmol/L (ref 98–110)
Creat: 0.65 mg/dL (ref 0.50–0.97)
Globulin: 1.9 g/dL (calc) (ref 1.9–3.7)
Glucose, Bld: 77 mg/dL (ref 65–99)
Potassium: 4 mmol/L (ref 3.5–5.3)
Sodium: 140 mmol/L (ref 135–146)
Total Bilirubin: 0.2 mg/dL (ref 0.2–1.2)
Total Protein: 6.2 g/dL (ref 6.1–8.1)
eGFR: 115 mL/min/{1.73_m2} (ref >=60)

## 2021-06-07 LAB — LUPUS ANTICOAGULANT EVAL W/ REFLEX
PTT-LA Screen: 32 s (ref <=40)
dRVVT: 42 s (ref <=45)

## 2021-06-07 LAB — CBC WITH DIFFERENTIAL/PLATELET
Absolute Monocytes: 360 cells/uL (ref 200–950)
Basophils Absolute: 68 cells/uL (ref 0–200)
Basophils Relative: 1 %
Eosinophils Absolute: 82 cells/uL (ref 15–500)
Eosinophils Relative: 1.2 %
HCT: 41.8 % (ref 35.0–45.0)
Hemoglobin: 13.5 g/dL (ref 11.7–15.5)
Lymphs Abs: 2584 cells/uL (ref 850–3900)
MCH: 30.5 pg (ref 27.0–33.0)
MCHC: 32.3 g/dL (ref 32.0–36.0)
MCV: 94.6 fL (ref 80.0–100.0)
MPV: 10 fL (ref 7.5–12.5)
Monocytes Relative: 5.3 %
Neutro Abs: 3706 cells/uL (ref 1500–7800)
Neutrophils Relative %: 54.5 %
Platelets: 325 10*3/uL (ref 140–400)
RBC: 4.42 10*6/uL (ref 3.80–5.10)
RDW: 11.8 % (ref 11.0–15.0)
Total Lymphocyte: 38 %
WBC: 6.8 10*3/uL (ref 3.8–10.8)

## 2021-06-07 LAB — HYDROXYCHLOROQUINE, SERUM/PLASMA: HYDROXYCHLOROQUINE, S/P: 300 ng/mL

## 2021-06-07 LAB — SEDIMENTATION RATE: Sed Rate: 2 mm/h (ref 0–20)

## 2021-06-07 LAB — C3 AND C4
C3 Complement: 113 mg/dL (ref 83–193)
C4 Complement: 26 mg/dL (ref 15–57)

## 2021-06-07 LAB — VITAMIN D 25 HYDROXY (VIT D DEFICIENCY, FRACTURES): Vit D, 25-Hydroxy: 32 ng/mL (ref 30–100)

## 2021-06-07 LAB — ANTI-DNA ANTIBODY, DOUBLE-STRANDED: ds DNA Ab: <1 IU/mL

## 2021-06-07 NOTE — Progress Notes (Signed)
Hydroxychloroquine level is 300 which is below therapeutic.  Please discuss with the she should take hydroxychloroquine twice a day Monday to Friday on a regular basis.

## 2021-06-08 NOTE — Progress Notes (Signed)
We will repeat hydroxychloroquine level at the follow-up visit.  If is still subtherapeutic we will increase the dose of hydroxychloroquine.  Although the disease is currently not active and dose adjustment is not required.

## 2021-06-13 ENCOUNTER — Other Ambulatory Visit: Payer: Self-pay | Admitting: Internal Medicine

## 2021-06-13 ENCOUNTER — Other Ambulatory Visit: Payer: Self-pay | Admitting: Physician Assistant

## 2021-06-15 NOTE — Telephone Encounter (Signed)
Next Visit: 10/28/2021  Last Visit: 05/28/2021  Last Fill: 03/22/2021  Dx: Raynaud's disease without gangrene   Current Dose per office note on 05/28/2021: Raynauds is controlled with a combination of hydroxychloroquine and amlodipine  Okay to refill Amlodipine?

## 2021-08-07 ENCOUNTER — Other Ambulatory Visit: Payer: Self-pay | Admitting: Physician Assistant

## 2021-08-09 NOTE — Telephone Encounter (Signed)
Next Visit: 10/28/2021  Last Visit: 05/28/2021  Labs: 05/28/2021 CBC and CMP are normal.   Eye exam: 07/23/2020 WNL    Current Dose per office note 05/28/2021: Plaquenil 200 mg 1 tablet by mouth twice daily M-F only   ML:[id number] disease   Last Fill: 04/19/2021  Left message to advise patient she is due to update her PLQ eye exam.   Okay to refill Plaquenil?

## 2021-09-25 ENCOUNTER — Other Ambulatory Visit: Payer: Self-pay | Admitting: Physician Assistant

## 2021-09-27 NOTE — Telephone Encounter (Signed)
Next Visit: 10/28/2021  Last Visit: 05/28/2021  Last Fill: 06/15/2021  Dx:  Raynaud's disease without gangrene   Current Dose per office note on 05/28/2021: not discussed  Okay to refill Amlodipine?

## 2021-10-14 NOTE — Progress Notes (Deleted)
Office Visit Note  Patient: Sharon Hartman             Date of Birth: [birth year]           MRN: [id number]             PCP: Carol Ada, MD Referring: Carol Ada, MD Visit Date: 10/28/2021 Occupation: _0 @  Subjective:    History of Present Illness: Sharon Hartman is a 41 y.o. female with history of autoimmune disease.  She is taking plaquenil 200 mg 1 tablet by mouth twice daily Monday through Friday.   Lab work from 05/28/21 was reviewed today in the office: hydroxychlorquine blood level was 300, dsDNA is negative, complements WNL, ESR WNL, CBC WNL, CMP WNL, UA normal, vitamin D WNL, and lupus anticoagulant is negative.   Activities of Daily Living:  Patient reports morning stiffness for *** {minute/hour:19697}.   Patient {ACTIONS;DENIES/REPORTS:21021675::"Denies"} nocturnal pain.  Difficulty dressing/grooming: {ACTIONS;DENIES/REPORTS:21021675::"Denies"} Difficulty climbing stairs: {ACTIONS;DENIES/REPORTS:21021675::"Denies"} Difficulty getting out of chair: {ACTIONS;DENIES/REPORTS:21021675::"Denies"} Difficulty using hands for taps, buttons, cutlery, and/or writing: {ACTIONS;DENIES/REPORTS:21021675::"Denies"}  No Rheumatology ROS completed.   PMFS History:  Patient Active Problem List   Diagnosis Date Noted   Tremor 09/12/2019   Chronic migraine 09/12/2019   Postpartum care following vaginal delivery (9/9) 06/18/2014   Post-dates pregnancy 06/17/2014   Palpitations 11/22/2013   Syncope 11/22/2013   Orthostatic hypotension 11/22/2013   ADHD (attention deficit hyperactivity disorder) 12/04/2011    Past Medical History:  Diagnosis Date   ADD (attention deficit disorder)    Anxiety    Constipation    DDD (degenerative disc disease), lumbar    Depression    Fatty liver    Galactorrhea    bilateral nipple discharge   History of basal cell carcinoma (BCC) excision    back 2017 excision   History of cervical dysplasia    CIN II  s/p LEEP 06/ 2005    History of suicide attempt    2003; 2006   History of syncope    02/ 2015  w/ palpitations and pregnant--- evaluation by cardiology, dr Debara Pickett, note in epic 03/ 2015, work-up done w/ normal echo and event monitor showed SR with isolated PVCs/ PACs   Hyperlipidemia    Low testosterone    Low vitamin D level    Lupus ([place])    Migraine    Numbness    bilateral hands and feet due to raynaurd's disease   Polyarthralgia    PONV (postoperative nausea and vomiting)    also lightweight when it comes to anesthesia   Raynaud's disease    rheumotology--- dr Estanislado Pandy--  with bilateral hand and feet numbness, and nipples per pt;  takes norvasc   Right lower quadrant pain    Scoliosis    thoracic   Sicca syndrome (HCC)    Tremor, unspecified    intermittant left hand/ wrist tremor with dorsiflexion  ( neurology evaluation by dr tat note in epic 08-15-2019, felt to be clonus)    Family History  Problem Relation Age of Onset   Heart murmur Mother    Diabetes Mother    Hepatitis Mother        auto immune    Breast cancer Paternal Grandmother    Hypertension Paternal Grandmother    Hyperlipidemia Paternal Grandmother    Heart disease Paternal Grandmother    Hypertension Paternal Grandfather    Heart disease Paternal Grandfather        CABG   Heart attack Paternal Grandfather  X4   Hyperlipidemia Paternal Grandfather    Hypertension Father    Hyperlipidemia Father    Hashimoto's thyroiditis Sister    Depression Sister    Breast cancer Sister    Anxiety disorder Sister    Scoliosis Brother    Drug abuse Brother    Autism Son    Past Surgical History:  Procedure Laterality Date   DILATATION & CURETTAGE/HYSTEROSCOPY WITH MYOSURE N/A 08/23/2019   Procedure: DILATATION & CURETTAGE/HYSTEROSCOPY WITH MYOSURE;  Surgeon: Servando Salina, MD;  Location: [place];  Service: Gynecology;  Laterality: N/A;   DILATION AND CURETTAGE OF UTERUS  1999   GANGLION CYST  EXCISION Right 2011   foot   LAPAROSCOPIC LYSIS OF ADHESIONS N/A 12/23/2019   Procedure: DIAGNOSTIC LAPAROSCOPY WITH PERITONEAL BIOPSY;  Surgeon: Servando Salina, MD;  Location: [place];  Service: Gynecology;  Laterality: N/A;  2 1/2 hrs.   LEEP  03-31-2004   _0    CIN II   TONSILLECTOMY  1998   WISDOM TOOTH EXTRACTION  1999   Social History   Social History Narrative   Lives at home with husband and son.   Right-handed.   No caffeine use.   Immunization History  Administered Date(s) Administered   PFIZER(Purple Top)SARS-COV-2 Vaccination 10/22/2019, 11/12/2019, 05/28/2020   PPD Test 02/13/2012, 11/14/2017, 12/25/2017   Tdap 02/13/2012     Objective: Vital Signs: There were no vitals taken for this visit.   Physical Exam Vitals and nursing note reviewed.  Constitutional:      Appearance: She is well-developed.  HENT:     Head: Normocephalic and atraumatic.  Eyes:     Conjunctiva/sclera: Conjunctivae normal.  Cardiovascular:     Rate and Rhythm: Normal rate and regular rhythm.     Heart sounds: Normal heart sounds.  Pulmonary:     Effort: Pulmonary effort is normal.     Breath sounds: Normal breath sounds.  Abdominal:     General: Bowel sounds are normal.     Palpations: Abdomen is soft.  Musculoskeletal:     Cervical back: Normal range of motion.  Lymphadenopathy:     Cervical: No cervical adenopathy.  Skin:    General: Skin is warm and dry.     Capillary Refill: Capillary refill takes less than 2 seconds.  Neurological:     Mental Status: She is alert and oriented to person, place, and time.  Psychiatric:        Behavior: Behavior normal.     Musculoskeletal Exam: ***  CDAI Exam: CDAI Score: -- Patient Global: --; Provider Global: -- Swollen: --; Tender: -- Joint Exam 10/28/2021   No joint exam has been documented for this visit   There is currently no information documented on the homunculus. Go to the Rheumatology activity and  complete the homunculus joint exam.  Investigation: No additional findings.  Imaging: No results found.  Recent Labs: Lab Results  Component Value Date   WBC 6.8 05/28/2021   HGB 13.5 05/28/2021   PLT 325 05/28/2021   NA 140 05/28/2021   K 4.0 05/28/2021   CL 107 05/28/2021   CO2 24 05/28/2021   GLUCOSE 77 05/28/2021   BUN 10 05/28/2021   CREATININE 0.65 05/28/2021   BILITOT 0.2 05/28/2021   ALKPHOS 32 (L) 08/31/2020   AST 15 05/28/2021   ALT 15 05/28/2021   PROT 6.2 05/28/2021   ALBUMIN 4.4 08/31/2020   CALCIUM 9.1 05/28/2021   GFRAA 127 12/22/2020    Speciality Comments:  PLQ Eye Exam 07/23/2020 WNL @ [facility] Follow up in 1 year  Procedures:  No procedures performed Allergies: Patient has no known allergies.   Assessment / Plan:     Visit Diagnoses: No diagnosis found.  Orders: No orders of the defined types were placed in this encounter.  No orders of the defined types were placed in this encounter.   Face-to-face time spent with patient was *** minutes. Greater than 50% of time was spent in counseling and coordination of care.  Follow-Up Instructions: No follow-ups on file.   Earnestine Mealing, CMA  Note - This record has been created using Editor, commissioning.  Chart creation errors have been sought, but may not always  have been located. Such creation errors do not reflect on  the standard of medical care.

## 2021-10-28 ENCOUNTER — Ambulatory Visit: Payer: Self-pay | Admitting: Physician Assistant

## 2021-10-28 DIAGNOSIS — F32A Depression, unspecified: Secondary | ICD-10-CM

## 2021-10-28 DIAGNOSIS — G250 Essential tremor: Secondary | ICD-10-CM

## 2021-10-28 DIAGNOSIS — Z8659 Personal history of other mental and behavioral disorders: Secondary | ICD-10-CM

## 2021-10-28 DIAGNOSIS — M7061 Trochanteric bursitis, right hip: Secondary | ICD-10-CM

## 2021-10-28 DIAGNOSIS — M4124 Other idiopathic scoliosis, thoracic region: Secondary | ICD-10-CM

## 2021-10-28 DIAGNOSIS — M3500 Sicca syndrome, unspecified: Secondary | ICD-10-CM

## 2021-10-28 DIAGNOSIS — I7300 Raynaud's syndrome without gangrene: Secondary | ICD-10-CM

## 2021-10-28 DIAGNOSIS — Z79899 Other long term (current) drug therapy: Secondary | ICD-10-CM

## 2021-10-28 DIAGNOSIS — M5136 Other intervertebral disc degeneration, lumbar region: Secondary | ICD-10-CM

## 2021-10-28 DIAGNOSIS — E559 Vitamin D deficiency, unspecified: Secondary | ICD-10-CM

## 2021-10-28 DIAGNOSIS — K760 Fatty (change of) liver, not elsewhere classified: Secondary | ICD-10-CM

## 2021-10-28 DIAGNOSIS — M359 Systemic involvement of connective tissue, unspecified: Secondary | ICD-10-CM

## 2021-10-28 DIAGNOSIS — R5383 Other fatigue: Secondary | ICD-10-CM

## 2021-10-28 DIAGNOSIS — I951 Orthostatic hypotension: Secondary | ICD-10-CM

## 2021-12-09 ENCOUNTER — Other Ambulatory Visit: Payer: Self-pay | Admitting: Physician Assistant

## 2021-12-27 ENCOUNTER — Other Ambulatory Visit: Payer: Self-pay | Admitting: Physician Assistant

## 2021-12-31 ENCOUNTER — Other Ambulatory Visit: Payer: Self-pay | Admitting: Family Medicine

## 2021-12-31 DIAGNOSIS — Z1231 Encounter for screening mammogram for malignant neoplasm of breast: Secondary | ICD-10-CM

## 2022-01-01 ENCOUNTER — Other Ambulatory Visit: Payer: Self-pay | Admitting: Physician Assistant

## 2022-01-04 NOTE — Progress Notes (Signed)
? ?Office Visit Note ? ?Patient: Sharon Hartman             ?Date of Birth: [birth year]           ?MRN: [id number]             ?PCP: Carol Ada, MD ?Referring: Carol Ada, MD ?Visit Date: 01/05/2022 ?Occupation: '@GUAROCC'$ @ ? ?Subjective:  ?Fatigue  ? ?History of Present Illness: Sharon Hartman is a 41 y.o. female with history of autoimmune disease.  She is taking plaquenil 200 mg 1 tablet by mouth twice daily Monday through Friday.  She continues to tolerate Plaquenil without any side effects and has not missed any doses recently.  Patient reports that she was diagnosed with COVID-19 in February 2023 and has had worsening symptoms since then.  She states that she has had increased fatigue, anxiety, and depression since recovering from the infection.  She has also noticed intermittent facial flushing.  She tries to avoid direct sun exposure due to ongoing photosensitivity.  She has ongoing hair loss and has intermittent symptoms of Raynaud's especially in her toes.  She remains on amlodipine 5 mg daily. She has occasional sores in her mouth but denies any nose sores.  She has had increased arthralgias, myalgias, and joint stiffness since recovering from COVID-19.  She denies any joint swelling at this time. ?She states that overall her autoimmune disease is significantly better controlled since initiating Plaquenil. ? ? ?Activities of Daily Living:  ?Patient reports morning stiffness for 1 hour.   ?Patient Reports nocturnal pain.  ?Difficulty dressing/grooming: Denies ?Difficulty climbing stairs: Denies ?Difficulty getting out of chair: Denies ?Difficulty using hands for taps, buttons, cutlery, and/or writing: Denies ? ?Review of Systems  ?Constitutional:  Positive for fatigue.  ?HENT:  Positive for mouth sores. Negative for mouth dryness and nose dryness.   ?Eyes:  Negative for pain, itching and dryness.  ?Respiratory:  Negative for difficulty breathing.   ?Cardiovascular:  Positive for palpitations.   ?Gastrointestinal:  Positive for constipation. Negative for blood in stool and diarrhea.  ?Endocrine: Negative for increased urination.  ?Genitourinary:  Negative for difficulty urinating.  ?Musculoskeletal:  Positive for myalgias, morning stiffness, muscle tenderness and myalgias. Negative for joint pain, joint pain and joint swelling.  ?Skin:  Positive for color change and redness.  ?Allergic/Immunologic: Negative for susceptible to infections.  ?Neurological:  Positive for dizziness, numbness and headaches. Negative for memory loss.  ?Hematological:  Negative for bruising/bleeding tendency.  ?Psychiatric/Behavioral:  Negative for confusion.   ? ?PMFS History:  ?Patient Active Problem List  ? Diagnosis Date Noted  ? Tremor 09/12/2019  ? Chronic migraine 09/12/2019  ? Postpartum care following vaginal delivery (9/9) 06/18/2014  ? Post-dates pregnancy 06/17/2014  ? Palpitations 11/22/2013  ? Syncope 11/22/2013  ? Orthostatic hypotension 11/22/2013  ? ADHD (attention deficit hyperactivity disorder) 12/04/2011  ?  ?Past Medical History:  ?Diagnosis Date  ? ADD (attention deficit disorder)   ? Anxiety   ? Constipation   ? DDD (degenerative disc disease), lumbar   ? Depression   ? Fatty liver   ? Galactorrhea   ? bilateral nipple discharge  ? History of basal cell carcinoma (BCC) excision   ? back 2017 excision  ? History of cervical dysplasia   ? CIN II  s/p LEEP 06/ 2005  ? History of suicide attempt   ? 2003; 2006  ? History of syncope   ? 02/ 2015  w/ palpitations and pregnant--- evaluation by cardiology, dr Debara Pickett,  note in epic 03/ 2015, work-up done w/ normal echo and event monitor showed SR with isolated PVCs/ PACs  ? Hyperlipidemia   ? Low testosterone   ? Low vitamin D level   ? Lupus ([place])   ? Migraine   ? Numbness   ? bilateral hands and feet due to raynaurd's disease  ? Polyarthralgia   ? PONV (postoperative nausea and vomiting)   ? also lightweight when it comes to anesthesia  ? Raynaud's disease   ?  rheumotology--- dr Estanislado Pandy--  with bilateral hand and feet numbness, and nipples per pt;  takes norvasc  ? Right lower quadrant pain   ? Scoliosis   ? thoracic  ? Sicca syndrome ([place])   ? Tremor, unspecified   ? intermittant left hand/ wrist tremor with dorsiflexion  ( neurology evaluation by dr tat note in epic 08-15-2019, felt to be clonus)  ?  ?Family History  ?Problem Relation Age of Onset  ? Heart murmur Mother   ? Diabetes Mother   ? Hepatitis Mother   ?     auto immune   ? Breast cancer Paternal Grandmother   ? Hypertension Paternal Grandmother   ? Hyperlipidemia Paternal Grandmother   ? Heart disease Paternal Grandmother   ? Hypertension Paternal Grandfather   ? Heart disease Paternal Grandfather   ?     CABG  ? Heart attack Paternal Grandfather   ?     X4  ? Hyperlipidemia Paternal Grandfather   ? Hypertension Father   ? Hyperlipidemia Father   ? Hashimoto's thyroiditis Sister   ? Depression Sister   ? Breast cancer Sister   ? Anxiety disorder Sister   ? Scoliosis Brother   ? Drug abuse Brother   ? Autism Son   ? ?Past Surgical History:  ?Procedure Laterality Date  ? DILATATION & CURETTAGE/HYSTEROSCOPY WITH MYOSURE N/A 08/23/2019  ? Procedure: [place];  Surgeon: Servando Salina, MD;  Location: [facility];  Service: Gynecology;  Laterality: N/A;  ? [place] OF UTERUS  1999  ? GANGLION CYST EXCISION Right 2011  ? foot  ? LAPAROSCOPIC LYSIS OF ADHESIONS N/A 12/23/2019  ? Procedure: DIAGNOSTIC LAPAROSCOPY WITH PERITONEAL BIOPSY;  Surgeon: Servando Salina, MD;  Location: [facility];  Service: Gynecology;  Laterality: N/A;  2 1/2 hrs.  ? LEEP  03-31-2004   '@[facility]'$   ? CIN II  ? TONSILLECTOMY  1998  ? [place] EXTRACTION  1999  ? ?Social History  ? ?Social History Narrative  ? Lives at home with husband and son.  ? Right-handed.  ? No caffeine use.  ? ?Immunization History  ?Administered Date(s) Administered  ?  PFIZER(Purple Top)SARS-COV-2 Vaccination 10/22/2019, 11/12/2019, 05/28/2020  ? PPD Test 02/13/2012, 11/14/2017, 12/25/2017  ? Tdap 02/13/2012  ?  ? ?Objective: ?Vital Signs: BP 117/81 (BP Location: Left Arm, Patient Position: Sitting, Cuff Size: Normal)   Pulse 85   Ht '5\' 6"'$  (1.676 m)   Wt 166 lb 12.8 oz (75.7 kg)   BMI 26.92 kg/m?   ? ?Physical Exam ?Vitals and nursing note reviewed.  ?Constitutional:   ?   Appearance: She is well-developed.  ?HENT:  ?   Head: Normocephalic and atraumatic.  ?Eyes:  ?   Conjunctiva/sclera: Conjunctivae normal.  ?Cardiovascular:  ?   Rate and Rhythm: Normal rate and regular rhythm.  ?   Heart sounds: Normal heart sounds.  ?Pulmonary:  ?   Effort: Pulmonary effort is normal.  ?  Breath sounds: Normal breath sounds.  ?Abdominal:  ?   General: Bowel sounds are normal.  ?   Palpations: Abdomen is soft.  ?Musculoskeletal:  ?   Cervical back: Normal range of motion.  ?Skin: ?   General: Skin is warm and dry.  ?   Capillary Refill: Capillary refill takes less than 2 seconds.  ?Neurological:  ?   Mental Status: She is alert and oriented to person, place, and time.  ?Psychiatric:     ?   Behavior: Behavior normal.  ?  ? ?Musculoskeletal Exam: C-spine, thoracic spine, lumbar spine have good range of motion.  No midline spinal tenderness.  Shoulder joints, elbow joints, wrist joints, MCPs, PIPs, DIPs have good range of motion with no synovitis.  She was able to make a complete fist bilaterally.  Hip joints have good range of motion with no groin pain.  Knee joints have good range of motion with no warmth or effusion.  Ankle joints have good range of motion with no tenderness or joint swelling. ? ?CDAI Exam: ?CDAI Score: -- ?Patient Global: --; Provider Global: -- ?Swollen: --; Tender: -- ?Joint Exam 01/05/2022  ? ?No joint exam has been documented for this visit  ? ?There is currently no information documented on the homunculus. Go to the Rheumatology activity and complete the homunculus  joint exam. ? ?Investigation: ?No additional findings. ? ?Imaging: ?No results found. ? ?Recent Labs: ?Lab Results  ?Component Value Date  ? WBC 6.8 05/28/2021  ? HGB 13.5 05/28/2021  ? PLT 325 05/28/2021  ? NA 140 05/28/2021  ?

## 2022-01-05 ENCOUNTER — Encounter: Payer: Self-pay | Admitting: Physician Assistant

## 2022-01-05 ENCOUNTER — Other Ambulatory Visit: Payer: Self-pay

## 2022-01-05 ENCOUNTER — Ambulatory Visit (INDEPENDENT_AMBULATORY_CARE_PROVIDER_SITE_OTHER): Payer: Managed Care, Other (non HMO) | Admitting: Physician Assistant

## 2022-01-05 VITALS — BP 117/81 | HR 85 | Ht 66.0 in | Wt 166.8 lb

## 2022-01-05 DIAGNOSIS — Z8659 Personal history of other mental and behavioral disorders: Secondary | ICD-10-CM

## 2022-01-05 DIAGNOSIS — E559 Vitamin D deficiency, unspecified: Secondary | ICD-10-CM

## 2022-01-05 DIAGNOSIS — M359 Systemic involvement of connective tissue, unspecified: Secondary | ICD-10-CM

## 2022-01-05 DIAGNOSIS — M3500 Sicca syndrome, unspecified: Secondary | ICD-10-CM

## 2022-01-05 DIAGNOSIS — M7062 Trochanteric bursitis, left hip: Secondary | ICD-10-CM

## 2022-01-05 DIAGNOSIS — R5383 Other fatigue: Secondary | ICD-10-CM

## 2022-01-05 DIAGNOSIS — Z1329 Encounter for screening for other suspected endocrine disorder: Secondary | ICD-10-CM

## 2022-01-05 DIAGNOSIS — Z79899 Other long term (current) drug therapy: Secondary | ICD-10-CM

## 2022-01-05 DIAGNOSIS — I7300 Raynaud's syndrome without gangrene: Secondary | ICD-10-CM | POA: Diagnosis not present

## 2022-01-05 DIAGNOSIS — M4124 Other idiopathic scoliosis, thoracic region: Secondary | ICD-10-CM

## 2022-01-05 DIAGNOSIS — G250 Essential tremor: Secondary | ICD-10-CM

## 2022-01-05 DIAGNOSIS — K760 Fatty (change of) liver, not elsewhere classified: Secondary | ICD-10-CM

## 2022-01-05 DIAGNOSIS — M7061 Trochanteric bursitis, right hip: Secondary | ICD-10-CM

## 2022-01-05 DIAGNOSIS — F32A Depression, unspecified: Secondary | ICD-10-CM

## 2022-01-05 DIAGNOSIS — M5136 Other intervertebral disc degeneration, lumbar region: Secondary | ICD-10-CM

## 2022-01-05 DIAGNOSIS — F419 Anxiety disorder, unspecified: Secondary | ICD-10-CM

## 2022-01-05 DIAGNOSIS — I951 Orthostatic hypotension: Secondary | ICD-10-CM

## 2022-01-05 MED ORDER — AMLODIPINE BESYLATE 5 MG PO TABS: ORAL_TABLET | ORAL | 0 refills | Status: DC

## 2022-01-05 MED ORDER — HYDROXYCHLOROQUINE SULFATE 200 MG PO TABS: ORAL_TABLET | ORAL | 0 refills | Status: DC

## 2022-01-06 NOTE — Progress Notes (Signed)
CBC and CMP WNL.  ESR WNL.  UA normal. Thyroid panel is WNL. Complements WNL.

## 2022-01-07 LAB — CBC WITH DIFFERENTIAL/PLATELET
Absolute Monocytes: 293 cells/uL (ref 200–950)
Basophils Absolute: 52 cells/uL (ref 0–200)
Basophils Relative: 0.8 %
Eosinophils Absolute: 59 cells/uL (ref 15–500)
Eosinophils Relative: 0.9 %
HCT: 42.9 % (ref 35.0–45.0)
Hemoglobin: 14.4 g/dL (ref 11.7–15.5)
Lymphs Abs: 2899 cells/uL (ref 850–3900)
MCH: 31.2 pg (ref 27.0–33.0)
MCHC: 33.6 g/dL (ref 32.0–36.0)
MCV: 93.1 fL (ref 80.0–100.0)
MPV: 10 fL (ref 7.5–12.5)
Monocytes Relative: 4.5 %
Neutro Abs: 3198 cells/uL (ref 1500–7800)
Neutrophils Relative %: 49.2 %
Platelets: 348 10*3/uL (ref 140–400)
RBC: 4.61 10*6/uL (ref 3.80–5.10)
RDW: 12 % (ref 11.0–15.0)
Total Lymphocyte: 44.6 %
WBC: 6.5 10*3/uL (ref 3.8–10.8)

## 2022-01-07 LAB — THYROID PANEL WITH TSH
Free Thyroxine Index: 2.1 (ref 1.4–3.8)
T3 Uptake: 29 % (ref 22–35)
T4, Total: 7.1 ug/dL (ref 5.1–11.9)
TSH: 1.34 mIU/L

## 2022-01-07 LAB — URINALYSIS, ROUTINE W REFLEX MICROSCOPIC
Bilirubin Urine: NEGATIVE
Glucose, UA: NEGATIVE
Hgb urine dipstick: NEGATIVE
Ketones, ur: NEGATIVE
Leukocytes,Ua: NEGATIVE
Nitrite: NEGATIVE
Protein, ur: NEGATIVE
Specific Gravity, Urine: 1.007 (ref 1.001–1.035)
pH: 6 (ref 5.0–8.0)

## 2022-01-07 LAB — COMPLETE METABOLIC PANEL WITH GFR
AG Ratio: 2.1 (calc) (ref 1.0–2.5)
ALT: 13 U/L (ref 6–29)
AST: 15 U/L (ref 10–30)
Albumin: 4.7 g/dL (ref 3.6–5.1)
Alkaline phosphatase (APISO): 36 U/L (ref 31–125)
BUN: 11 mg/dL (ref 7–25)
CO2: 26 mmol/L (ref 20–32)
Calcium: 10.1 mg/dL (ref 8.6–10.2)
Chloride: 101 mmol/L (ref 98–110)
Creat: 0.68 mg/dL (ref 0.50–0.99)
Globulin: 2.2 g/dL (calc) (ref 1.9–3.7)
Glucose, Bld: 87 mg/dL (ref 65–99)
Potassium: 4.4 mmol/L (ref 3.5–5.3)
Sodium: 138 mmol/L (ref 135–146)
Total Bilirubin: 0.4 mg/dL (ref 0.2–1.2)
Total Protein: 6.9 g/dL (ref 6.1–8.1)
eGFR: 113 mL/min/{1.73_m2} (ref >=60)

## 2022-01-07 LAB — ANA: Anti Nuclear Antibody (ANA): NEGATIVE

## 2022-01-07 LAB — C3 AND C4
C3 Complement: 105 mg/dL (ref 83–193)
C4 Complement: 24 mg/dL (ref 15–57)

## 2022-01-07 LAB — ANTI-DNA ANTIBODY, DOUBLE-STRANDED: ds DNA Ab: <1 IU/mL

## 2022-01-07 LAB — SEDIMENTATION RATE: Sed Rate: 11 mm/h (ref 0–20)

## 2022-01-10 ENCOUNTER — Ambulatory Visit
Admission: RE | Admit: 2022-01-10 | Discharge: 2022-01-10 | Disposition: A | Discharge status: Home / Self Care | Payer: Managed Care, Other (non HMO) | Source: Ambulatory Visit | Attending: Family Medicine | Admitting: Family Medicine

## 2022-01-10 DIAGNOSIS — Z1231 Encounter for screening mammogram for malignant neoplasm of breast: Secondary | ICD-10-CM

## 2022-01-10 NOTE — Progress Notes (Signed)
ANA is negative.  dsDNA is negative. Labs are not consistent with active disease.

## 2022-03-13 ENCOUNTER — Other Ambulatory Visit: Payer: Self-pay | Admitting: Physician Assistant

## 2022-03-14 NOTE — Telephone Encounter (Signed)
Next Visit: 06/09/2022  Last Visit: 01/05/2022  Labs: 01/05/2022, CBC and CMP WNL.  ESR WNL.  UA normal. Thyroid panel is WNL. Complements WNL. ANA is negative.  dsDNA is negative. Labs are not consistent with active disease.   Eye exam:  07/23/2020 WNL @ [facility] Follow up in 1 year, Patient will call to schedule PLQ Eye Exam  Current Dose per office note 01/05/2022: Plaquenil 200 mg 1 tablet by mouth twice daily M-F only  DX: Autoimmune disease   Last Fill: 01/05/2022  Okay to refill Plaquenil?

## 2022-03-18 ENCOUNTER — Emergency Department (HOSPITAL_BASED_OUTPATIENT_CLINIC_OR_DEPARTMENT_OTHER): Payer: Managed Care, Other (non HMO)

## 2022-03-18 ENCOUNTER — Other Ambulatory Visit: Payer: Self-pay

## 2022-03-18 ENCOUNTER — Emergency Department (HOSPITAL_BASED_OUTPATIENT_CLINIC_OR_DEPARTMENT_OTHER)
Admission: EM | Admit: 2022-03-18 | Discharge: 2022-03-18 | Disposition: A | Discharge status: Home / Self Care | Payer: Managed Care, Other (non HMO) | Attending: Emergency Medicine | Admitting: Emergency Medicine

## 2022-03-18 ENCOUNTER — Encounter (HOSPITAL_BASED_OUTPATIENT_CLINIC_OR_DEPARTMENT_OTHER): Payer: Self-pay | Admitting: Emergency Medicine

## 2022-03-18 DIAGNOSIS — E876 Hypokalemia: Secondary | ICD-10-CM | POA: Diagnosis not present

## 2022-03-18 DIAGNOSIS — M79605 Pain in left leg: Secondary | ICD-10-CM | POA: Insufficient documentation

## 2022-03-18 DIAGNOSIS — Z8616 Personal history of COVID-19: Secondary | ICD-10-CM | POA: Insufficient documentation

## 2022-03-18 DIAGNOSIS — Z7982 Long term (current) use of aspirin: Secondary | ICD-10-CM | POA: Diagnosis not present

## 2022-03-18 DIAGNOSIS — R0602 Shortness of breath: Secondary | ICD-10-CM | POA: Insufficient documentation

## 2022-03-18 LAB — COMPREHENSIVE METABOLIC PANEL
ALT: 14 U/L (ref 0–44)
AST: 17 U/L (ref 15–41)
Albumin: 4.3 g/dL (ref 3.5–5.0)
Alkaline Phosphatase: 33 U/L — ABNORMAL LOW (ref 38–126)
Anion gap: 8 (ref 5–15)
BUN: 12 mg/dL (ref 6–20)
CO2: 22 mmol/L (ref 22–32)
Calcium: 9.1 mg/dL (ref 8.9–10.3)
Chloride: 107 mmol/L (ref 98–111)
Creatinine, Ser: 0.78 mg/dL (ref 0.44–1.00)
GFR, Estimated: >60 mL/min (ref >60)
Glucose, Bld: 88 mg/dL (ref 70–99)
Potassium: 3.1 mmol/L — ABNORMAL LOW (ref 3.5–5.1)
Sodium: 137 mmol/L (ref 135–145)
Total Bilirubin: 0.6 mg/dL (ref 0.3–1.2)
Total Protein: 6.7 g/dL (ref 6.5–8.1)

## 2022-03-18 LAB — URINALYSIS, ROUTINE W REFLEX MICROSCOPIC
Bilirubin Urine: NEGATIVE
Glucose, UA: NEGATIVE mg/dL
Hgb urine dipstick: NEGATIVE
Ketones, ur: 15 mg/dL — AB
Leukocytes,Ua: NEGATIVE
Nitrite: NEGATIVE
Protein, ur: NEGATIVE mg/dL
Specific Gravity, Urine: >=1.03 (ref 1.005–1.030)
pH: 6 (ref 5.0–8.0)

## 2022-03-18 LAB — CBC WITH DIFFERENTIAL/PLATELET
Abs Immature Granulocytes: 0.02 10*3/uL (ref 0.00–0.07)
Basophils Absolute: 0.1 10*3/uL (ref 0.0–0.1)
Basophils Relative: 1 %
Eosinophils Absolute: 0.1 10*3/uL (ref 0.0–0.5)
Eosinophils Relative: 1 %
HCT: 36.9 % (ref 36.0–46.0)
Hemoglobin: 13 g/dL (ref 12.0–15.0)
Immature Granulocytes: 0 %
Lymphocytes Relative: 30 %
Lymphs Abs: 2.3 10*3/uL (ref 0.7–4.0)
MCH: 31.5 pg (ref 26.0–34.0)
MCHC: 35.2 g/dL (ref 30.0–36.0)
MCV: 89.3 fL (ref 80.0–100.0)
Monocytes Absolute: 0.4 10*3/uL (ref 0.1–1.0)
Monocytes Relative: 5 %
Neutro Abs: 4.9 10*3/uL (ref 1.7–7.7)
Neutrophils Relative %: 63 %
Platelets: 321 10*3/uL (ref 150–400)
RBC: 4.13 MIL/uL (ref 3.87–5.11)
RDW: 11.9 % (ref 11.5–15.5)
WBC: 7.7 10*3/uL (ref 4.0–10.5)
nRBC: 0 % (ref 0.0–0.2)

## 2022-03-18 LAB — D-DIMER, QUANTITATIVE: D-Dimer, Quant: <0.27 ug/mL-FEU (ref 0.00–0.50)

## 2022-03-18 LAB — TROPONIN I (HIGH SENSITIVITY): Troponin I (High Sensitivity): <2 ng/L (ref <18)

## 2022-03-18 MED ORDER — POTASSIUM CHLORIDE CRYS ER 20 MEQ PO TBCR: 20.0000 meq | EXTENDED_RELEASE_TABLET | Freq: Every day | ORAL | 0 refills | Status: DC

## 2022-03-18 NOTE — ED Triage Notes (Addendum)
Left calf pain x 2 days . New onset shortness of breath today . Hx lupus .  Here to rule out DVT  Adds feel dizzy , started today

## 2022-03-18 NOTE — Discharge Instructions (Addendum)
Your laboratory results are within normal limits.  Do recommend an outpatient follow-up with cardiology.  If you experience any worsening symptoms, worsening pain you will need to return to emergency department.

## 2022-03-18 NOTE — ED Provider Notes (Signed)
[facility] EMERGENCY DEPARTMENT Provider Note   CSN: [id number] Arrival date & time: 03/18/22  1614     History  Chief Complaint  Patient presents with   Shortness of Breath    Sharon Hartman is a 41 y.o. female.  41 y.o female with a PMH of Lupus anticoagulant presents to the ED with a chief complaint of left calf pain for the past 2 days.  Patient reports after having COVID-19 in the middle of February she has not felt the same.  She does report feeling a pain along the left calf noted to be likely a charley horse, however pain now has returned.  She also feels somewhat rundown over the last couple of days.  She is having flareup of her Raynaud's which she states does not usually occur during the summer.  She is also endorsing some shortness of breath, this occurs when she ambulates but improves when she is at rest.  She is followed by Dr. Estanislado Pandy, last seen approximately 1 month ago.  She denies any prior history of blood clots, no recent travels, no recent surgeries.  No chest pain on today's visit, no fever.    The history is provided by the patient and medical records.  Shortness of Breath Severity:  Moderate Onset quality:  Gradual Duration:  2 days Timing:  Sporadic Progression:  Worsening Chronicity:  New Context: not emotional upset, not pollens, not smoke exposure and not weather changes   Relieved by:  Rest Worsened by:  Movement Ineffective treatments:  None tried Associated symptoms: no abdominal pain, no chest pain, no claudication, no fever, no sore throat, no syncope and no vomiting   Risk factors: no recent alcohol use, no family hx of DVT, no hx of PE/DVT, no prolonged immobilization, no recent surgery and no tobacco use        Home Medications Prior to Admission medications   Medication Sig Start Date End Date Taking? Authorizing Provider  potassium chloride SA (KLOR-CON M) 20 MEQ tablet Take 1 tablet (20 mEq total) by mouth daily for 7 days.  03/18/22 03/25/22 Yes Harshil Cavallaro, PA-C  albuterol (VENTOLIN HFA) 108 (90 Base) MCG/ACT inhaler albuterol sulfate HFA 90 mcg/actuation aerosol inhaler    [provider]  amLODipine (NORVASC) 5 MG tablet Take 1 tablet by mouth daily. 01/05/22   Ofilia Neas, PA-C  amphetamine-dextroamphetamine (ADDERALL) 10 MG tablet Take 10 mg by mouth daily with breakfast.    [provider]  aspirin EC 81 MG tablet Take 81 mg by mouth daily.    [provider]  buPROPion (WELLBUTRIN SR) 150 MG 12 hr tablet Take 150 mg by mouth daily.    [provider]  desvenlafaxine (PRISTIQ) 50 MG 24 hr tablet Take 50 mg by mouth daily. 03/26/20   [provider]  gabapentin (NEURONTIN) 100 MG capsule Take 100 mg by mouth 3 (three) times daily.    [provider]  Galcanezumab-gnlm (EMGALITY) 120 MG/ML SOAJ Inject into the skin every 30 (thirty) days.    [provider]  hydroxychloroquine (PLAQUENIL) 200 MG tablet TAKE 1 TABLET BY MOUTH TWICE DAILY MONDAY THROUGH FRIDAY. 03/14/22   Ofilia Neas, PA-C  ibuprofen (ADVIL) 800 MG tablet Take 1 tablet (800 mg total) by mouth every 8 (eight) hours as needed for cramping. 08/23/19   Servando Salina, MD  linaclotide Rolan Lipa) 72 MCG capsule Take 72 mcg by mouth daily before breakfast.  Patient not taking: Reported on 05/28/2021 09/03/19  [provider]  loratadine (CLARITIN) 10 MG tablet Take 10 mg by mouth daily.    [provider]  LORazepam (ATIVAN) 0.5 MG tablet as needed.    [provider]  Magnesium 250 MG TABS Take 250 mg by mouth daily.     [provider]  Multiple Vitamins-Minerals (MULTIVITAMIN ADULT PO) Take 1 tablet by mouth daily.     [provider]  norethindrone-ethinyl estradiol (LOESTRIN) 1-20 MG-MCG tablet Take 1 tablet by mouth daily. Patient not taking: Reported on 01/05/2022 04/02/20   [provider]  ondansetron (ZOFRAN) 4 MG tablet as  needed. 10/07/19   [provider]  Probiotic Product (PROBIOTIC PO) Take 1 capsule by mouth daily.     [provider]  Riboflavin 400 MG TABS Take 400 mg by mouth daily.     [provider]  rosuvastatin (CRESTOR) 5 MG tablet TAKE 1 TABLET BY MOUTH EVERY DAY Patient not taking: Reported on 01/05/2022 12/12/20   Philemon Kingdom, MD  SENNA PO Take by mouth daily.    [provider]  sertraline (ZOLOFT) 100 MG tablet Take 100 mg by mouth daily.    [provider]      Allergies    Patient has no known allergies.    Review of Systems   Review of Systems  Constitutional:  Negative for fever.  HENT:  Negative for sore throat.   Respiratory:  Positive for shortness of breath.   Cardiovascular:  Negative for chest pain, claudication, leg swelling and syncope.  Gastrointestinal:  Negative for abdominal pain, nausea and vomiting.  Genitourinary:  Negative for flank pain.  Musculoskeletal:  Negative for back pain and myalgias.  Neurological:  Negative for light-headedness.  All other systems reviewed and are negative.   Physical Exam Updated Vital Signs BP 118/80 (BP Location: Right Arm)   Pulse 91   Temp 98.2 F (36.8 C) (Oral)   Resp 20   Ht '5\' 6"'$  (1.676 m)   Wt 74.8 kg   SpO2 100%   BMI 26.63 kg/m  Physical Exam Vitals and nursing note reviewed.  Constitutional:      General: She is not in acute distress.    Appearance: She is well-developed.  HENT:     Head: Normocephalic and atraumatic.     Mouth/Throat:     Pharynx: No oropharyngeal exudate.  Eyes:     Pupils: Pupils are equal, round, and reactive to light.  Cardiovascular:     Rate and Rhythm: Regular rhythm.     Heart sounds: Normal heart sounds.  Pulmonary:     Effort: Pulmonary effort is normal. No respiratory distress.     Breath sounds: Normal breath sounds. No decreased breath sounds.  Abdominal:     General: Bowel sounds are normal. There is no distension.      Palpations: Abdomen is soft.     Tenderness: There is no abdominal tenderness.  Musculoskeletal:        General: No tenderness or deformity.     Cervical back: Normal range of motion.     Right lower leg: No edema.     Left lower leg: No edema.  Skin:    General: Skin is warm and dry.  Neurological:     Mental Status: She is alert and oriented to person, place, and time.     ED Results / Procedures / Treatments   Labs (all labs ordered are listed, but only abnormal results are displayed) Labs Reviewed  URINALYSIS, ROUTINE W REFLEX MICROSCOPIC - Abnormal; Notable for the following components:      Result Value   Ketones, ur 15 (*)    All other components within normal limits  COMPREHENSIVE METABOLIC PANEL - Abnormal; Notable for the following components:   Potassium 3.1 (*)    Alkaline Phosphatase 33 (*)    All other components within normal limits  CBC WITH DIFFERENTIAL/PLATELET  D-DIMER, QUANTITATIVE  TROPONIN I (HIGH SENSITIVITY)    EKG EKG Interpretation  Date/Time:  Friday March 18 2022 16:29:11 EDT Ventricular Rate:  90 PR Interval:  148 QRS Duration: 109 QT Interval:  391 QTC Calculation: 479 R Axis:   81 Text Interpretation: Sinus rhythm Probable left atrial enlargement new Borderline T wave abnormalities Confirmed by Blanchie Dessert [phone]) on 03/18/2022 6:39:35 PM  Radiology US Venous Img Lower Unilateral Left  Result Date: 03/18/2022 CLINICAL DATA:  Left calf pain. EXAM: LEFT LOWER EXTREMITY VENOUS DOPPLER ULTRASOUND TECHNIQUE: Gray-scale sonography with graded compression, as well as color Doppler and duplex ultrasound were performed to evaluate the lower extremity deep venous systems from the level of the common femoral vein and including the common femoral, femoral, profunda femoral, popliteal and calf veins including the posterior tibial, peroneal and gastrocnemius veins when visible. The superficial great saphenous vein was also interrogated. Spectral  Doppler was utilized to evaluate flow at rest and with distal augmentation maneuvers in the common femoral, femoral and popliteal veins. COMPARISON:  None Available. FINDINGS: Contralateral Common Femoral Vein: Respiratory phasicity is normal and symmetric with the symptomatic side. No evidence of thrombus. Normal compressibility. Common Femoral Vein: No evidence of thrombus. Normal compressibility, respiratory phasicity and response to augmentation. Saphenofemoral Junction: No evidence of thrombus. Normal compressibility and flow on color Doppler imaging. Profunda Femoral Vein: No evidence of thrombus. Normal compressibility and flow on color Doppler imaging. Femoral Vein: No evidence of thrombus. Normal compressibility, respiratory phasicity and response to augmentation. Popliteal Vein: No evidence of thrombus. Normal compressibility, respiratory phasicity and response to augmentation. Calf Veins: No evidence of thrombus. Normal compressibility and flow on color Doppler imaging. Superficial Great Saphenous Vein: No evidence of thrombus. Normal compressibility. Venous Reflux:  None. Other Findings: No evidence of superficial thrombophlebitis or abnormal fluid collection. IMPRESSION: No evidence of left lower extremity deep venous thrombosis. Electronically Signed   By: Aletta Edouard M.D.   On: 03/18/2022 17:08   DG Chest 2 View  Result Date: 03/18/2022 CLINICAL DATA:  shortness of breath EXAM: CHEST - 2 VIEW COMPARISON:  August 27, 2020 FINDINGS: The heart size and mediastinal contours are within normal limits. Both lungs are clear. The visualized skeletal structures are unremarkable. IMPRESSION: No active cardiopulmonary disease and no significant interval change. Electronically Signed   By: Frazier Richards M.D.   On: 03/18/2022 16:59    Procedures Procedures    Medications Ordered in ED Medications - No data to display  ED Course/ Medical Decision Making/ A&P                           Medical  Decision Making Amount and/or Complexity of Data Reviewed Labs: ordered. Radiology: ordered.  This patient presents to the ED for concern of shortness of breath, this involves a number of treatment options, and is a complaint that carries with it a high risk of complications and morbidity.  The differential diagnosis includes pulmonary embolism, ACS, pneumonia versus viral infection.    Co morbidities: Discussed in HPI  Brief History:  Patient with a past medical history of lupus anticoagulant into the ED with a chief complaint of shortness of breath has been ongoing for the past 2 days along with left calf tenderness.  Patient is concerned for DVT.  Does report not feeling the same since her COVID-19 infection in the month of February.  Denies any chest pain on today's visit.  EMR reviewed including pt PMHx, past surgical history and past visits to ER.   See HPI for more details   Lab Tests:  I ordered and independently interpreted labs.  The pertinent results include:    I personally reviewed all laboratory work and imaging. Metabolic panel without any acute abnormality specifically kidney function within normal limits and no significant electrolyte abnormalities. CBC without leukocytosis or significant anemia.Troponin is negaitve, patient without any active chest pain I do feel that a second troponin is needed at this time.  D-dimer level was obtained to rule out pulmonary embolism.  This is negative on today's visit.   Imaging Studies:  DVT study is negative. DG chest 2 view showed no signs of infiltrates,   Cardiac Monitoring:  The patient was maintained on a cardiac monitor.  I personally viewed and interpreted the cardiac monitored which showed an underlying rhythm of: NSR with some nonspecific T wave inversions.  Discussed with patient and recommended cardiology outpatient follow-up. EKG non-ischemic   Medicines ordered:  N/A  Reevaluation:  After the  interventions noted above I re-evaluated patient and found that they have :stayed the same   Social Determinants of Health:  The patient's social determinants of health were a factor in the care of this patient    Problem List / ED Course:  Patient here with concern of DVT due to left calf pain that has been ongoing for the past 2 days.  Also endorses a shortness of breath exacerbated with movement.  Reports he has not felt the same since COVID-19 infection in mid January.  Labs are within normal limit aside from some slight decrease in potassium, did provide patient with prescription for oral potassium.  Ultrasound of her left calf was negative.  X-ray without any signs of pneumonia or acute infection.  D-dimer level is also negative.  Troponin is negative, patient denies any chest pain at this time we discontinue second troponin.  Did discuss EKG changes with her will need to have her follow-up with cardiology on outpatient basis.  UA without any signs of infection, no nitrites or leukocytes.   Dispostion:  After consideration of the diagnostic results and the patients response to treatment, I feel that the patent would benefit from outpatient follow up with cardiology.    Portions of this note were generated with [employer]. Dictation errors may occur despite best attempts at proofreading.   Final Clinical Impression(s) / ED Diagnoses Final diagnoses:  SOB (shortness of breath)    Rx / DC Orders ED Discharge Orders          Ordered    Ambulatory referral to Cardiology        03/18/22 1844    potassium chloride SA (KLOR-CON M) 20 MEQ tablet  Daily        03/18/22 1846              Janeece Fitting, PA-C 03/18/22 Ellin Mayhew, MD 03/21/22 2146

## 2022-03-19 ENCOUNTER — Other Ambulatory Visit: Payer: Self-pay | Admitting: Physician Assistant

## 2022-03-21 NOTE — Telephone Encounter (Signed)
Next Visit: 06/09/2022  Last Visit: 01/05/2022  Last Fill: 01/05/2022  DX: Raynaud's disease without gangrene  Current Dose per office note 01/05/2022: amlodipine 5 mg 1 tablet by mouth daily   Okay to refill amlodipine?

## 2022-03-22 ENCOUNTER — Encounter: Payer: Self-pay | Admitting: Cardiology

## 2022-03-22 ENCOUNTER — Ambulatory Visit (INDEPENDENT_AMBULATORY_CARE_PROVIDER_SITE_OTHER): Payer: Managed Care, Other (non HMO) | Admitting: Cardiology

## 2022-03-22 ENCOUNTER — Ambulatory Visit (INDEPENDENT_AMBULATORY_CARE_PROVIDER_SITE_OTHER): Payer: Managed Care, Other (non HMO)

## 2022-03-22 VITALS — HR 105 | Ht 66.0 in | Wt 165.4 lb

## 2022-03-22 DIAGNOSIS — R0602 Shortness of breath: Secondary | ICD-10-CM | POA: Diagnosis not present

## 2022-03-22 DIAGNOSIS — R002 Palpitations: Secondary | ICD-10-CM

## 2022-03-22 MED ORDER — PROPRANOLOL HCL 10 MG PO TABS
10.0000 mg | ORAL_TABLET | Freq: Three times a day (TID) | ORAL | 3 refills | Status: AC | PRN
Start: 2022-03-22 — End: ?

## 2022-03-22 NOTE — Patient Instructions (Addendum)
Medication Instructions:  Your physician has recommended you make the following change in your medication:  START: Propanolol 10 mg every 8 hours as needed if heart rate is over 150 bpm for over 15 minutes *If you need a refill on your cardiac medications before your next appointment, please call your pharmacy*   Lab Work: None If you have labs (blood work) drawn today and your tests are completely normal, you will receive your results only by: [place] (if you have MyChart) OR A paper copy in the mail If you have any lab test that is abnormal or we need to change your treatment, we will call you to review the results.   Testing/Procedures: Your physician has requested that you have an echocardiogram. Echocardiography is a painless test that uses sound waves to create images of your heart. It provides your doctor with information about the size and shape of your heart and how well your heart's chambers and valves are working. This procedure takes approximately one hour. There are no restrictions for this procedure.  ZIO XT- Long Term Monitor Instructions  Your physician has requested you wear a ZIO patch monitor for 14 days.  This is a single patch monitor. [employer] supplies one patch monitor per enrollment. Additional stickers are not available. Please do not apply patch if you will be having a Nuclear Stress Test,  Echocardiogram, Cardiac CT, MRI, or Chest Xray during the period you would be wearing the  monitor. The patch cannot be worn during these tests. You cannot remove and re-apply the  ZIO XT patch monitor.  Your ZIO patch monitor will be mailed 3 day USPS to your address on file. It may take 3-5 days  to receive your monitor after you have been enrolled.  Once you have received your monitor, please review the enclosed instructions. Your monitor  has already been registered assigning a specific monitor serial # to you.  Billing and Patient Assistance Program  Information  We have supplied [employer] with any of your insurance information on file for billing purposes. [employer] offers a sliding scale Patient Assistance Program for patients that do not have  insurance, or whose insurance does not completely cover the cost of the ZIO monitor.  You must apply for the Patient Assistance Program to qualify for this discounted rate.  To apply, please call [employer] at [phone], select option 4, select option 2, ask to apply for  Patient Assistance Program. Theodore Demark will ask your household income, and how many people  are in your household. They will quote your out-of-pocket cost based on that information.  [employer] will also be able to set up a 74-month interest-free payment plan if needed.  Applying the monitor   Shave hair from upper left chest.  Hold abrader disc by orange tab. Rub abrader in 40 strokes over the upper left chest as  indicated in your monitor instructions.  Clean area with 4 enclosed alcohol pads. Let dry.  Apply patch as indicated in monitor instructions. Patch will be placed under collarbone on left  side of chest with arrow pointing upward.  Rub patch adhesive wings for 2 minutes. Remove white label marked "1". Remove the white  label marked "2". Rub patch adhesive wings for 2 additional minutes.  While looking in a mirror, press and release button in center of patch. A small green light will  flash 3-4 times. This will be your only indicator that the monitor has been turned on.  Do not shower for the  first 24 hours. You may shower after the first 24 hours.  Press the button if you feel a symptom. You will hear a small click. Record Date, Time and  Symptom in the Patient Logbook.  When you are ready to remove the patch, follow instructions on the last 2 pages of Patient  Logbook. Stick patch monitor onto the last page of Patient Logbook.  Place Patient Logbook in the blue and white box. Use locking tab on box and tape box closed   securely. The blue and white box has prepaid postage on it. Please place it in the mailbox as  soon as possible. Your physician should have your test results approximately 7 days after the  monitor has been mailed back to [facility]  Call [place] at [phone] if you have questions regarding  your ZIO XT patch monitor. Call them immediately if you see an orange light blinking on your  monitor.  If your monitor falls off in less than 4 days, contact our Monitor department at [phone].  If your monitor becomes loose or falls off after 4 days call [employer] at [phone] for  suggestions on securing your monitor    Follow-Up: At [facility], you and your health needs are our priority.  As part of our continuing mission to provide you with exceptional heart care, we have created designated Provider Care Teams.  These Care Teams include your primary Cardiologist (physician) and Advanced Practice Providers (APPs -  Physician Assistants and Nurse Practitioners) who all work together to provide you with the care you need, when you need it.  We recommend signing up for the patient portal called "MyChart".  Sign up information is provided on this After Visit Summary.  MyChart is used to connect with patients for Virtual Visits (Telemedicine).  Patients are able to view lab/test results, encounter notes, upcoming appointments, etc.  Non-urgent messages can be sent to your provider as well.   To learn more about what you can do with MyChart, go to [email or web address].    Your next appointment:   4 month(s)  The format for your next appointment:   In Person  Provider:   Berniece Salines, DO    Other Instructions   Important Information About Sugar

## 2022-03-22 NOTE — Progress Notes (Unsigned)
Enrolled patient for a 14 day Zio XT  monitor to be mailed to patients home  °

## 2022-03-23 NOTE — Progress Notes (Signed)
Cardiology Office Note:    Date:  03/23/2022   ID:  Sharon Hartman, DOB 04/05/1981, MRN [id number]  PCP:  Carol Ada, MD  Cardiologist:  Berniece Salines, DO  Electrophysiologist:  None   Referring MD: Janeece Fitting, PA-C   " I am short of breath"  History of Present Illness:    Sharon Hartman is a 41 y.o. female with a hx of anxiety depression, hyperlipidemia, Raynaud's disease, recently mixed connective tissue disorder and lupus here today to be evaluated for significant palpitations shortness of breath.  The patient tells me that she has been experiencing intermittent palpitations.  With me she gets significantly short of breath.  She has not passed out but she is concerned.  She notes that intermittently heart rate can go up to the 160s sustained for a little bit and then resolved.  She tells me her stamina also is not like the way it used to be.   Past Medical History:  Diagnosis Date   ADD (attention deficit disorder)    Anxiety    Constipation    DDD (degenerative disc disease), lumbar    Depression    Fatty liver    Galactorrhea    bilateral nipple discharge   History of basal cell carcinoma (BCC) excision    back 2017 excision   History of cervical dysplasia    CIN II  s/p LEEP 06/ 2005   History of suicide attempt    2003; 2006   History of syncope    02/ 2015  w/ palpitations and pregnant--- evaluation by cardiology, dr Debara Pickett, note in epic 03/ 2015, work-up done w/ normal echo and event monitor showed SR with isolated PVCs/ PACs   Hyperlipidemia    Low testosterone    Low vitamin D level    Lupus ([place])    Migraine    Numbness    bilateral hands and feet due to raynaurd's disease   Polyarthralgia    PONV (postoperative nausea and vomiting)    also lightweight when it comes to anesthesia   Raynaud's disease    rheumotology--- dr Estanislado Pandy--  with bilateral hand and feet numbness, and nipples per pt;  takes norvasc   Right lower quadrant pain    Scoliosis     thoracic   Sicca syndrome ([place])    Tremor, unspecified    intermittant left hand/ wrist tremor with dorsiflexion  ( neurology evaluation by dr tat note in epic 08-15-2019, felt to be clonus)    Past Surgical History:  Procedure Laterality Date   DILATATION & CURETTAGE/HYSTEROSCOPY WITH MYOSURE N/A 08/23/2019   Procedure: [place];  Surgeon: Servando Salina, MD;  Location: [place];  Service: Gynecology;  Laterality: N/A;   DILATION AND CURETTAGE OF UTERUS  1999   GANGLION CYST EXCISION Right 2011   foot   LAPAROSCOPIC LYSIS OF ADHESIONS N/A 12/23/2019   Procedure: DIAGNOSTIC LAPAROSCOPY WITH PERITONEAL BIOPSY;  Surgeon: Servando Salina, MD;  Location: [place];  Service: Gynecology;  Laterality: N/A;  2 1/2 hrs.   LEEP  03-31-2004   '@[facility]'$    CIN II   TONSILLECTOMY  1998   WISDOM TOOTH EXTRACTION  1999    Current Medications: Current Meds  Medication Sig   albuterol (VENTOLIN HFA) 108 (90 Base) MCG/ACT inhaler albuterol sulfate HFA 90 mcg/actuation aerosol inhaler   amLODipine (NORVASC) 5 MG tablet TAKE 1 TABLET BY MOUTH EVERYDAY AT BEDTIME   amphetamine-dextroamphetamine (ADDERALL) 10 MG tablet Take  10 mg by mouth daily with breakfast.   aspirin EC 81 MG tablet Take 81 mg by mouth daily.   buPROPion (WELLBUTRIN SR) 150 MG 12 hr tablet Take 150 mg by mouth daily.   desvenlafaxine (PRISTIQ) 50 MG 24 hr tablet Take 50 mg by mouth daily.   gabapentin (NEURONTIN) 100 MG capsule Take 100 mg by mouth 3 (three) times daily.   hydroxychloroquine (PLAQUENIL) 200 MG tablet TAKE 1 TABLET BY MOUTH TWICE DAILY MONDAY THROUGH FRIDAY.   ibuprofen (ADVIL) 800 MG tablet Take 1 tablet (800 mg total) by mouth every 8 (eight) hours as needed for cramping.   loratadine (CLARITIN) 10 MG tablet Take 10 mg by mouth daily.   LORazepam (ATIVAN) 0.5 MG tablet as needed.   Magnesium 250 MG TABS Take 250 mg by mouth daily.     Multiple Vitamins-Minerals (MULTIVITAMIN ADULT PO) Take 1 tablet by mouth daily.    ondansetron (ZOFRAN) 4 MG tablet as needed.   potassium chloride SA (KLOR-CON M) 20 MEQ tablet Take 1 tablet (20 mEq total) by mouth daily for 7 days.   Probiotic Product (PROBIOTIC PO) Take 1 capsule by mouth daily.    propranolol (INDERAL) 10 MG tablet Take 1 tablet (10 mg total) by mouth every 8 (eight) hours as needed (if heart rate is over 150 bpm for over 15 minutes).   Riboflavin 400 MG TABS Take 400 mg by mouth daily.    SENNA PO Take by mouth daily.   sertraline (ZOLOFT) 100 MG tablet Take 100 mg by mouth daily.     Allergies:   Patient has no known allergies.   Social History   Socioeconomic History   Marital status: Legally Separated    Spouse name: Not on file   Number of children: 1   Years of education: college   Highest education level: Not on file  Occupational History   Occupation: RN  Tobacco Use   Smoking status: Former    Packs/day: 0.20    Years: 7.00    Total pack years: 1.40    Types: Cigarettes    Quit date: 08/20/2004    Years since quitting: 17.6    Passive exposure: Past   Smokeless tobacco: Never  Vaping Use   Vaping Use: Never used  Substance and Sexual Activity   Alcohol use: Yes    Comment: rarely   Drug use: Never   Sexual activity: Yes    Birth control/protection: None  Other Topics Concern   Not on file  Social History Narrative   Lives at home with husband and son.   Right-handed.   No caffeine use.   Social Determinants of Health   Financial Resource Strain: Not on file  Food Insecurity: Not on file  Transportation Needs: Not on file  Physical Activity: Sufficiently Active (10/19/2017)   Exercise Vital Sign    Days of Exercise per Week: 4 days    Minutes of Exercise per Session: 40 min  Stress: Stress Concern Present (10/19/2017)   [place]    Feeling of Stress : Rather much   Social Connections: Somewhat Isolated (10/19/2017)   Social Connection and Isolation Panel [NHANES]    Frequency of Communication with Friends and Family: More than three times a week    Frequency of Social Gatherings with Friends and Family: Once a week    Attends Religious Services: Never    Marine scientist or Organizations: No  Attends Archivist Meetings: Never    Marital Status: Living with partner     Family History: The patient's family history includes Anxiety disorder in her sister; Autism in her son; Breast cancer in her paternal grandmother and sister; Depression in her sister; Diabetes in her mother; Drug abuse in her brother; Hashimoto's thyroiditis in her sister; Heart attack in her paternal grandfather; Heart disease in her paternal grandfather and paternal grandmother; Heart murmur in her mother; Hepatitis in her mother; Hyperlipidemia in her father, paternal grandfather, and paternal grandmother; Hypertension in her father, paternal grandfather, and paternal grandmother; Scoliosis in her brother.  ROS:   Review of Systems  Constitution: Negative for decreased appetite, fever and weight gain.  HENT: Negative for congestion, ear discharge, hoarse voice and sore throat.   Eyes: Negative for discharge, redness, vision loss in right eye and visual halos.  Cardiovascular: Negative for chest pain, dyspnea on exertion, leg swelling, orthopnea and palpitations.  Respiratory: Negative for cough, hemoptysis, shortness of breath and snoring.   Endocrine: Negative for heat intolerance and polyphagia.  Hematologic/Lymphatic: Negative for bleeding problem. Does not bruise/bleed easily.  Skin: Negative for flushing, nail changes, rash and suspicious lesions.  Musculoskeletal: Negative for arthritis, joint pain, muscle cramps, myalgias, neck pain and stiffness.  Gastrointestinal: Negative for abdominal pain, bowel incontinence, diarrhea and excessive appetite.   Genitourinary: Negative for decreased libido, genital sores and incomplete emptying.  Neurological: Negative for brief paralysis, focal weakness, headaches and loss of balance.  Psychiatric/Behavioral: Negative for altered mental status, depression and suicidal ideas.  Allergic/Immunologic: Negative for HIV exposure and persistent infections.    EKGs/Labs/Other Studies Reviewed:    The following studies were reviewed today:   EKG:  The ekg ordered today demonstrates   Recent Labs: 01/05/2022: TSH 1.34 03/18/2022: ALT 14; BUN 12; Creatinine, Ser 0.78; Hemoglobin 13.0; Platelets 321; Potassium 3.1; Sodium 137  Recent Lipid Panel No results found for: "CHOL", "TRIG", "HDL", "CHOLHDL", "VLDL", "LDLCALC", "LDLDIRECT"  Physical Exam:    VS:  Pulse (!) 105   Ht '5\' 6"'$  (1.676 m)   Wt 165 lb 6.4 oz (75 kg)   SpO2 98%   BMI 26.70 kg/m     Wt Readings from Last 3 Encounters:  03/22/22 165 lb 6.4 oz (75 kg)  03/18/22 165 lb (74.8 kg)  01/05/22 166 lb 12.8 oz (75.7 kg)     GEN: Well nourished, well developed in no acute distress HEENT: Normal NECK: No JVD; No carotid bruits LYMPHATICS: No lymphadenopathy CARDIAC: S1S2 noted,RRR, no murmurs, rubs, gallops RESPIRATORY:  Clear to auscultation without rales, wheezing or rhonchi  ABDOMEN: Soft, non-tender, non-distended, +bowel sounds, no guarding. EXTREMITIES: No edema, No cyanosis, no clubbing MUSCULOSKELETAL:  No deformity  SKIN: Warm and dry NEUROLOGIC:  Alert and oriented x 3, non-focal PSYCHIATRIC:  Normal affect, good insight  ASSESSMENT:    1. Palpitations   2. SOB (shortness of breath)    PLAN:      I would like to rule out a cardiovascular etiology of this palpitation, therefore at this time I would like to placed a zio patch for   14  days.  With her newly diagnosed mixed connective tissue disease this may also be postural orthostatic tachycardia syndrome versus inappropriate sinus tachycardia.  But we will review the  monitor for further understanding. In additon with her shortness of breath a transthoracic echocardiogram will be ordered to assess LV/RV function and any structural abnormalities. Once these testing have been performed amd reviewed  further reccomendations will be made. For now, I will give the patient propanolol 10 mg every 8 hours for heart rate greater than 150 lasting about 15 minutes Educated patient how to use this.  The patient is in agreement with the above plan. The patient left the office in stable condition.  The patient will follow up in 4 months or sooner if needed.   Medication Adjustments/Labs and Tests Ordered: Current medicines are reviewed at length with the patient today.  Concerns regarding medicines are outlined above.  Orders Placed This Encounter  Procedures   LONG TERM MONITOR (3-14 DAYS)   ECHOCARDIOGRAM COMPLETE   Meds ordered this encounter  Medications   propranolol (INDERAL) 10 MG tablet    Sig: Take 1 tablet (10 mg total) by mouth every 8 (eight) hours as needed (if heart rate is over 150 bpm for over 15 minutes).    Dispense:  150 tablet    Refill:  3    Patient Instructions  Medication Instructions:  Your physician has recommended you make the following change in your medication:  START: Propanolol 10 mg every 8 hours as needed if heart rate is over 150 bpm for over 15 minutes *If you need a refill on your cardiac medications before your next appointment, please call your pharmacy*   Lab Work: None If you have labs (blood work) drawn today and your tests are completely normal, you will receive your results only by: [place] (if you have MyChart) OR A paper copy in the mail If you have any lab test that is abnormal or we need to change your treatment, we will call you to review the results.   Testing/Procedures: Your physician has requested that you have an echocardiogram. Echocardiography is a painless test that uses sound waves to create  images of your heart. It provides your doctor with information about the size and shape of your heart and how well your heart's chambers and valves are working. This procedure takes approximately one hour. There are no restrictions for this procedure.  ZIO XT- Long Term Monitor Instructions  Your physician has requested you wear a ZIO patch monitor for 14 days.  This is a single patch monitor. [employer] supplies one patch monitor per enrollment. Additional stickers are not available. Please do not apply patch if you will be having a Nuclear Stress Test,  Echocardiogram, Cardiac CT, MRI, or Chest Xray during the period you would be wearing the  monitor. The patch cannot be worn during these tests. You cannot remove and re-apply the  ZIO XT patch monitor.  Your ZIO patch monitor will be mailed 3 day USPS to your address on file. It may take 3-5 days  to receive your monitor after you have been enrolled.  Once you have received your monitor, please review the enclosed instructions. Your monitor  has already been registered assigning a specific monitor serial # to you.  Billing and Patient Assistance Program Information  We have supplied [employer] with any of your insurance information on file for billing purposes. [employer] offers a sliding scale Patient Assistance Program for patients that do not have  insurance, or whose insurance does not completely cover the cost of the ZIO monitor.  You must apply for the Patient Assistance Program to qualify for this discounted rate.  To apply, please call [employer] at [phone], select option 4, select option 2, ask to apply for  Patient Assistance Program. Theodore Demark will ask your household income, and how many people  are in your household. They will quote your out-of-pocket cost based on that information.  [employer] will also be able to set up a 67-month interest-free payment plan if needed.  Applying the monitor   Shave hair from upper left chest.  Hold  abrader disc by orange tab. Rub abrader in 40 strokes over the upper left chest as  indicated in your monitor instructions.  Clean area with 4 enclosed alcohol pads. Let dry.  Apply patch as indicated in monitor instructions. Patch will be placed under collarbone on left  side of chest with arrow pointing upward.  Rub patch adhesive wings for 2 minutes. Remove white label marked "1". Remove the white  label marked "2". Rub patch adhesive wings for 2 additional minutes.  While looking in a mirror, press and release button in center of patch. A small green light will  flash 3-4 times. This will be your only indicator that the monitor has been turned on.  Do not shower for the first 24 hours. You may shower after the first 24 hours.  Press the button if you feel a symptom. You will hear a small click. Record Date, Time and  Symptom in the Patient Logbook.  When you are ready to remove the patch, follow instructions on the last 2 pages of Patient  Logbook. Stick patch monitor onto the last page of Patient Logbook.  Place Patient Logbook in the blue and white box. Use locking tab on box and tape box closed  securely. The blue and white box has prepaid postage on it. Please place it in the mailbox as  soon as possible. Your physician should have your test results approximately 7 days after the  monitor has been [facility]  Call [place] [phone] you have questions regarding  your ZIO XT patch monitor. Call them immediately if you see an orange light blinking on your  monitor.  If your monitor falls off in less than 4 days, contact our Monitor department at [phone]  If your monitor becomes loose or falls off after 4 days call [employer] at [phone]  suggestions on securing your monitor    Follow-Up: [facility] and your [facility] needs [facility] our [facility].  As part of our continuing mission to provide you with exceptional heart  care, we have created designated Provider Care Teams.  These Care Teams include your primary Cardiologist (physician) and Advanced Practice Providers (APPs -  Physician Assistants and Nurse Practitioners) who all work together to provide you with the care you need, when you need it.  We recommend signing up for the patient portal called "MyChart".  Sign up information is provided on this After Visit Summary.  MyChart is used to connect with patients for Virtual Visits (Telemedicine).  Patients are able to view lab/test results, encounter notes, upcoming appointments, etc.  Non-urgent messages can be sent to your provider as well.   To learn more about what you can do with MyChart, go to [email or web address]    Your next appointment:   4 month(s)  The format for your next appointment:   In Person  Provider:   KBerniece Salines DO    Other Instructions   Important Information About Sugar         Adopting a Healthy Lifestyle.  Know what a healthy weight is for you (roughly BMI <25) and aim to maintain this   Aim for 7+ servings of fruits and vegetables daily  65-80+ fluid ounces of water or unsweet tea for healthy kidneys   Limit to max 1 drink of alcohol per day; avoid smoking/tobacco   Limit animal fats in diet for cholesterol and heart health - choose grass fed whenever available   Avoid highly processed foods, and foods high in saturated/trans fats   Aim for low stress - take time to unwind and care for your mental health   Aim for 150 min of moderate intensity exercise weekly for heart health, and weights twice weekly for bone health   Aim for 7-9 hours of sleep daily   When it comes to diets, agreement about the perfect plan isnt easy to find, even among the experts. Experts at the [employer] developed an idea known as the Healthy Eating Plate. Just imagine a plate divided into logical, healthy portions.   The emphasis is on diet quality:    Load up on vegetables and fruits - one-half of your plate: Aim for color and variety, and remember that potatoes dont count.   Go for whole grains - one-quarter of your plate: Whole wheat, barley, wheat berries, quinoa, oats, brown rice, and foods made with them. If you want pasta, go with whole wheat pasta.   Protein power - one-quarter of your plate: Fish, chicken, beans, and nuts are all healthy, versatile protein sources. Limit red meat.   The diet, however, does go beyond the plate, offering a few other suggestions.   Use healthy plant oils, such as olive, canola, soy, corn, sunflower and peanut. Check the labels, and avoid partially hydrogenated oil, which have unhealthy trans fats.   If youre thirsty, drink water. Coffee and tea are good in moderation, but skip sugary drinks and limit milk and dairy products to one or two daily servings.   The type of carbohydrate in the diet is more important than the amount. Some sources of carbohydrates, such as vegetables, fruits, whole grains, and beans-are healthier than others.   Finally, stay active  Signed, Berniece Salines, DO  03/23/2022 9:01 AM    [place]

## 2022-04-04 ENCOUNTER — Ambulatory Visit (HOSPITAL_COMMUNITY): Payer: Managed Care, Other (non HMO) | Attending: Internal Medicine

## 2022-04-04 ENCOUNTER — Other Ambulatory Visit (HOSPITAL_COMMUNITY): Payer: Managed Care, Other (non HMO)

## 2022-04-04 DIAGNOSIS — R0602 Shortness of breath: Secondary | ICD-10-CM | POA: Diagnosis not present

## 2022-04-04 DIAGNOSIS — R002 Palpitations: Secondary | ICD-10-CM

## 2022-04-04 LAB — ECHOCARDIOGRAM COMPLETE
Area-P 1/2: 3.76 cm2
S' Lateral: 2.5 cm

## 2022-04-05 ENCOUNTER — Encounter: Payer: Self-pay | Admitting: Cardiology

## 2022-04-05 ENCOUNTER — Ambulatory Visit (INDEPENDENT_AMBULATORY_CARE_PROVIDER_SITE_OTHER): Payer: Managed Care, Other (non HMO) | Admitting: Family

## 2022-04-05 ENCOUNTER — Encounter (HOSPITAL_BASED_OUTPATIENT_CLINIC_OR_DEPARTMENT_OTHER): Payer: Self-pay | Admitting: Family

## 2022-04-05 VITALS — BP 92/78 | HR 86 | Ht 66.0 in | Wt 165.0 lb

## 2022-04-05 DIAGNOSIS — I7300 Raynaud's syndrome without gangrene: Secondary | ICD-10-CM | POA: Diagnosis not present

## 2022-04-05 DIAGNOSIS — R42 Dizziness and giddiness: Secondary | ICD-10-CM | POA: Diagnosis not present

## 2022-04-05 DIAGNOSIS — M329 Systemic lupus erythematosus, unspecified: Secondary | ICD-10-CM

## 2022-04-05 DIAGNOSIS — E876 Hypokalemia: Secondary | ICD-10-CM

## 2022-04-05 DIAGNOSIS — R002 Palpitations: Secondary | ICD-10-CM | POA: Diagnosis not present

## 2022-04-05 DIAGNOSIS — R0602 Shortness of breath: Secondary | ICD-10-CM

## 2022-04-05 LAB — D-DIMER, QUANTITATIVE: D-DIMER: <0.2 mg/L FEU (ref 0.00–0.49)

## 2022-04-06 NOTE — Addendum Note (Signed)
Addended by: Gerald Stabs on: 04/06/2022 04:15 PM   Modules accepted: Orders

## 2022-04-08 ENCOUNTER — Encounter (HOSPITAL_BASED_OUTPATIENT_CLINIC_OR_DEPARTMENT_OTHER): Payer: Self-pay

## 2022-04-13 ENCOUNTER — Encounter (HOSPITAL_BASED_OUTPATIENT_CLINIC_OR_DEPARTMENT_OTHER): Payer: Self-pay

## 2022-04-28 ENCOUNTER — Encounter: Payer: Self-pay | Admitting: Cardiology

## 2022-04-29 MED ORDER — MIDODRINE HCL 5 MG PO TABS: 5.0000 mg | ORAL_TABLET | Freq: Three times a day (TID) | ORAL | 6 refills | Status: DC

## 2022-04-29 NOTE — Addendum Note (Signed)
Addended by: Waylan Rocher on: 04/29/2022 08:20 AM   Modules accepted: Orders

## 2022-05-14 LAB — THYROID PANEL WITH TSH
Free Thyroxine Index: 1.8 (ref 1.2–4.9)
T3 Uptake Ratio: 24 % (ref 24–39)
T4, Total: 7.4 ug/dL (ref 4.5–12.0)
TSH: 0.868 u[IU]/mL (ref 0.450–4.500)

## 2022-05-14 LAB — CBC
Hematocrit: 41 % (ref 34.0–46.6)
Hemoglobin: 14.3 g/dL (ref 11.1–15.9)
MCH: 31.9 pg (ref 26.6–33.0)
MCHC: 34.9 g/dL (ref 31.5–35.7)
MCV: 92 fL (ref 79–97)
Platelets: 356 10*3/uL (ref 150–450)
RBC: 4.48 x10E6/uL (ref 3.77–5.28)
RDW: 11.8 % (ref 11.7–15.4)
WBC: 6.7 10*3/uL (ref 3.4–10.8)

## 2022-05-14 LAB — BASIC METABOLIC PANEL
BUN/Creatinine Ratio: 20 (ref 9–23)
BUN: 16 mg/dL (ref 6–24)
CO2: 21 mmol/L (ref 20–29)
Calcium: 10.2 mg/dL (ref 8.7–10.2)
Chloride: 105 mmol/L (ref 96–106)
Creatinine, Ser: 0.82 mg/dL (ref 0.57–1.00)
Glucose: 101 mg/dL — ABNORMAL HIGH (ref 70–99)
Potassium: 4.1 mmol/L (ref 3.5–5.2)
Sodium: 142 mmol/L (ref 134–144)
eGFR: 93 mL/min/{1.73_m2} (ref >59)

## 2022-05-14 LAB — MAGNESIUM: Magnesium: 2.2 mg/dL (ref 1.6–2.3)

## 2022-05-14 LAB — D-DIMER, QUANTITATIVE

## 2022-05-16 ENCOUNTER — Telehealth (HOSPITAL_BASED_OUTPATIENT_CLINIC_OR_DEPARTMENT_OTHER): Payer: Self-pay

## 2022-05-16 NOTE — Telephone Encounter (Addendum)
RN called [facility] to inquire about why labs resulted again 1 month later. Per [facility] the d-dimer resulted on it own, and we were sent preliminary results on other labs, then when D-dimer resulted everything else finalized, NP notified.      ----- Message from Loel Dubonnet, NP sent at 05/14/2022 12:24 PM EDT ----- Labs from last month already relayed via [facility].  Normal kidney function, electrolytes, thyroid. CBC with no evidence of anemia nor infection.    Unclear why just uploaded to chart - Daphene Jaeger, will you please reach out to [employer]? [place]!

## 2022-05-20 ENCOUNTER — Other Ambulatory Visit: Payer: Self-pay | Admitting: Physician Assistant

## 2022-05-26 NOTE — Progress Notes (Signed)
Office Visit Note  Patient: Sharon Hartman             Date of Birth: [birth year]           MRN: [id number]             PCP: Carol Ada, MD Referring: Carol Ada, MD Visit Date: 06/09/2022 Occupation: '@GUAROCC'$ @  Subjective:  Left hip pain  History of Present Illness: Sharon Hartman is a 41 y.o. female with history of undifferentiated connective tissue disease and degenerative disc disease.  She states she has been having pain and discomfort in her left hip.  She is having difficulty sleeping on the  left side.  She has difficulty climbing stairs and getting out of the chair.  She gives history of fatigue, dry mouth and dry eyes.  She gives history of Raynaud's phenomenon, hair loss and photosensitivity.  She has not seen any joint inflammation.  She has been taking hydroxychloroquine 200 mg p.o. twice daily Monday to Friday.  She states that she has bradycardia and palpitations.  She has an appointment coming up with the cardiologist.  Activities of Daily Living:  Patient reports morning stiffness for all day. Patient Reports nocturnal pain.  Difficulty dressing/grooming: Denies Difficulty climbing stairs: Reports Difficulty getting out of chair: Reports Difficulty using hands for taps, buttons, cutlery, and/or writing: Denies  Review of Systems  Constitutional:  Positive for fatigue.  HENT:  Positive for mouth dryness. Negative for mouth sores.   Eyes:  Positive for dryness.  Respiratory:  Positive for shortness of breath.   Cardiovascular:  Positive for chest pain and palpitations.  Gastrointestinal:  Negative for blood in stool, constipation and diarrhea.  Endocrine: Negative for increased urination.  Genitourinary:  Negative for involuntary urination.  Musculoskeletal:  Positive for joint pain, joint pain, myalgias, morning stiffness, muscle tenderness and myalgias. Negative for gait problem, joint swelling and muscle weakness.  Skin:  Positive for color change, hair  loss and sensitivity to sunlight. Negative for rash.  Allergic/Immunologic: Negative for susceptible to infections.  Neurological:  Positive for dizziness and headaches.  Hematological:  Negative for swollen glands.  Psychiatric/Behavioral:  Positive for depressed mood and sleep disturbance. The patient is nervous/anxious.     PMFS History:  Patient Active Problem List   Diagnosis Date Noted   Tremor 09/12/2019   Chronic migraine 09/12/2019   Postpartum care following vaginal delivery (9/9) 06/18/2014   Post-dates pregnancy 06/17/2014   Palpitations 11/22/2013   Syncope 11/22/2013   Orthostatic hypotension 11/22/2013   ADHD (attention deficit hyperactivity disorder) 12/04/2011    Past Medical History:  Diagnosis Date   ADD (attention deficit disorder)    Anxiety    Constipation    DDD (degenerative disc disease), lumbar    Depression    Fatty liver    Galactorrhea    bilateral nipple discharge   History of basal cell carcinoma (BCC) excision    back 2017 excision   History of cervical dysplasia    CIN II  s/p LEEP 06/ 2005   History of suicide attempt    2003; 2006   History of syncope    02/ 2015  w/ palpitations and pregnant--- evaluation by cardiology, dr Debara Pickett, note in epic 03/ 2015, work-up done w/ normal echo and event monitor showed SR with isolated PVCs/ PACs   Hyperlipidemia    Inappropriate sinus tachycardia    Low testosterone    Low vitamin D level    Lupus ([place])  Migraine    Numbness    bilateral hands and feet due to raynaurd's disease   Polyarthralgia    PONV (postoperative nausea and vomiting)    also lightweight when it comes to anesthesia   Raynaud's disease    rheumotology--- dr Estanislado Pandy--  with bilateral hand and feet numbness, and nipples per pt;  takes norvasc   Right lower quadrant pain    Scoliosis    thoracic   Sicca syndrome (HCC)    Tremor, unspecified    intermittant left hand/ wrist tremor with dorsiflexion  ( neurology evaluation  by dr tat note in epic 08-15-2019, felt to be clonus)    Family History  Problem Relation Age of Onset   Heart murmur Mother    Diabetes Mother    Hepatitis Mother        auto immune    Breast cancer Paternal Grandmother    Hypertension Paternal Grandmother    Hyperlipidemia Paternal Grandmother    Heart disease Paternal Grandmother    Hypertension Paternal Grandfather    Heart disease Paternal Grandfather        CABG   Heart attack Paternal Grandfather        X4   Hyperlipidemia Paternal Grandfather    Hypertension Father    Hyperlipidemia Father    Hashimoto's thyroiditis Sister    Depression Sister    Breast cancer Sister    Anxiety disorder Sister    Scoliosis Brother    Drug abuse Brother    Autism Son    Past Surgical History:  Procedure Laterality Date   DILATATION & CURETTAGE/HYSTEROSCOPY WITH MYOSURE N/A 08/23/2019   Procedure: DILATATION & CURETTAGE/HYSTEROSCOPY WITH MYOSURE;  Surgeon: Servando Salina, MD;  Location: [facility];  Service: Gynecology;  Laterality: N/A;   DILATION AND CURETTAGE OF UTERUS  1999   GANGLION CYST EXCISION Right 2011   foot   LAPAROSCOPIC LYSIS OF ADHESIONS N/A 12/23/2019   Procedure: DIAGNOSTIC LAPAROSCOPY WITH PERITONEAL BIOPSY;  Surgeon: Servando Salina, MD;  Location: [facility];  Service: Gynecology;  Laterality: N/A;  2 1/2 hrs.   LEEP  03-31-2004   '@[facility]'$    CIN II   TONSILLECTOMY  1998   WISDOM TOOTH EXTRACTION  1999   Social History   Social History Narrative   Lives at home with husband and son.   Right-handed.   No caffeine use.   Immunization History  Administered Date(s) Administered   PFIZER(Purple Top)SARS-COV-2 Vaccination 10/22/2019, 11/12/2019, 05/28/2020   PPD Test 02/13/2012, 11/14/2017, 12/25/2017   Tdap 02/13/2012     Objective: Vital Signs: BP 115/88 (BP Location: Left Arm, Patient Position: Sitting, Cuff Size: Normal)   Pulse 90   Resp 18   Ht '5\' 6"'$  (1.676 m)    Wt 161 lb 3.2 oz (73.1 kg)   BMI 26.02 kg/m    Physical Exam Vitals and nursing note reviewed.  Constitutional:      Appearance: She is well-developed.  HENT:     Head: Normocephalic and atraumatic.  Eyes:     Conjunctiva/sclera: Conjunctivae normal.  Cardiovascular:     Rate and Rhythm: Normal rate and regular rhythm.     Heart sounds: Normal heart sounds.  Pulmonary:     Effort: Pulmonary effort is normal.     Breath sounds: Normal breath sounds.  Abdominal:     General: Bowel sounds are normal.     Palpations: Abdomen is soft.  Musculoskeletal:     Cervical back: Normal range of  motion.  Lymphadenopathy:     Cervical: No cervical adenopathy.  Skin:    General: Skin is warm and dry.     Capillary Refill: Capillary refill takes less than 2 seconds.  Neurological:     Mental Status: She is alert and oriented to person, place, and time.  Psychiatric:        Behavior: Behavior normal.      Musculoskeletal Exam: C-spine was in good range of motion.  Shoulder joints, elbow joints, wrist joints, MCPs PIPs and DIPs with good range of motion with no synovitis.  Hip joints and knee joints with good range of motion.  She had tenderness over left trochanteric bursa.  She had discomfort range of motion of her left hip joint.  Knee joints with good range of motion without any warmth swelling or effusion.  There was no tenderness over ankles or MTPs.  CDAI Exam: CDAI Score: -- Patient Global: --; Provider Global: -- Swollen: --; Tender: -- Joint Exam 06/09/2022   No joint exam has been documented for this visit   There is currently no information documented on the homunculus. Go to the Rheumatology activity and complete the homunculus joint exam.  Investigation: No additional findings.  Imaging: XR HIP UNILAT W OR W/O PELVIS 2-3 VIEWS LEFT  Result Date: 06/09/2022 No hip joint narrowing or SI joint narrowing was noted.  No chondrocalcinosis was noted. Impression: Unremarkable  x-ray of the hip joint.   Recent Labs: Lab Results  Component Value Date   WBC 6.7 04/05/2022   HGB 14.3 04/05/2022   PLT 356 04/05/2022   NA 142 04/05/2022   K 4.1 04/05/2022   CL 105 04/05/2022   CO2 21 04/05/2022   GLUCOSE 101 (H) 04/05/2022   BUN 16 04/05/2022   CREATININE 0.82 04/05/2022   BILITOT 0.6 03/18/2022   ALKPHOS 33 (L) 03/18/2022   AST 17 03/18/2022   ALT 14 03/18/2022   PROT 6.7 03/18/2022   ALBUMIN 4.3 03/18/2022   CALCIUM 10.2 04/05/2022   GFRAA 127 12/22/2020    Speciality Comments: PLQ Eye Exam 05/26/2022 WNL @ [facility] Follow up in 1 year  Procedures:  Large Joint Inj: L greater trochanter on 06/09/2022 11:08 AM Indications: pain Details: 27 G 1.5 in needle, lateral approach  Arthrogram: No  Medications: 40 mg triamcinolone acetonide 40 MG/ML; 1.5 mL lidocaine 1 % Aspirate: 0 mL Outcome: tolerated well, no immediate complications Procedure, treatment alternatives, risks and benefits explained, specific risks discussed. Consent was given by the patient. Immediately prior to procedure a time out was called to verify the correct patient, procedure, equipment, support staff and site/side marked as required. Patient was prepped and draped in the usual sterile fashion.     Allergies: Patient has no known allergies.   Assessment / Plan:     Visit Diagnoses: Undifferentiated connective tissue disease ([place]) - Lupus anticoagulant detected on 04/27/2020, later negative.  AVISE index negative: -2.8.  Anti-C1q IgG is positive, rest of panel negative on 05/15/20:  -She continues to have fatigue, sicca symptoms, Raynaud's, arthralgias,Hair loss and photosensitivity.  No synovitis was noted on the examination. plan: Protein / creatinine ratio, urine, ANA, Anti-DNA antibody, double-stranded, C3 and C4, Sedimentation rate  High risk medication use - Plaquenil 200 mg 1 tablet by mouth twice daily M-F only (Started after office visit on 05/06/20). PLQ Eye Exam  05/26/2022  -abs from April 05, 2022 were reviewed CBC was normal.  CMP was normal on March 18, 2022.  Autoimmune  labs were negative on January 05, 2022 which included sedimentation rate complements and double-stranded DNA.  I advised her to get repeat labs in September.  Plan: CBC with Differential/Platelet, COMPLETE METABOLIC PANEL WITH GFR  Raynaud's disease without gangrene -she can history of Raynaud's symptoms.  She states symptoms are controlled on amlodipine 5 mg 1 tablet by mouth daily.   Sicca syndrome (HCC)-she is history of dry mouth and dry eyes.  Over-the-counter products were discussed at length.  Other fatigue - She has been experiencing increased fatigue since being diagnosed with COVID-19 in February 2023.  Pain in left hip -she complains of pain and discomfort in her left hip.  X-rays obtained of the left hip joint were reviewed with the patient.  X-rays were unremarkable.  Plan: XR HIP UNILAT W OR W/O PELVIS 2-3 VIEWS LEFT  Trochanteric bursitis of both hips-she had  tenderness on palpation over left trochanteric bursa consistent with trochanteric bursitis.  After informed consent was obtained and side effects were discussed the left trochanteric bursa was injected with lidocaine and cortisone as described above.  Patient tolerated the procedure well.  Postprocedure instructions were given.  A handout on IT band stretches was given.  Other idiopathic scoliosis, thoracic region  DDD (degenerative disc disease), lumbar-she could history of some lower back pain.  Core strengthening exercises were discussed.  Orthostatic hypotension-patient been experiencing orthostasis and bradycardia.  She has an appointment coming up with cardiologist.  Other medical problems are listed as follows:  Anxiety and depression  History of ADHD  Essential tremor  Vitamin D deficiency -she has history of vitamin D deficiency.  She has been taking vitamin D.  Her last vitamin D level was 32 on May 28, 2021.  Plan: VITAMIN D 25 Hydroxy (Vit-D Deficiency, Fractures)  Fatty liver  Orders: Orders Placed This Encounter  Procedures   Large Joint Inj   XR HIP UNILAT W OR W/O PELVIS 2-3 VIEWS LEFT   Protein / creatinine ratio, urine   CBC with Differential/Platelet   COMPLETE METABOLIC PANEL WITH GFR   ANA   Anti-DNA antibody, double-stranded   C3 and C4   Sedimentation rate   VITAMIN D 25 Hydroxy (Vit-D Deficiency, Fractures)   No orders of the defined types were placed in this encounter.   Follow-Up Instructions: Return in about 5 months (around 11/09/2022) for UCTD.   Bo Merino, MD  Note - This record has been created using Editor, commissioning.  Chart creation errors have been sought, but may not always  have been located. Such creation errors do not reflect on  the standard of medical care.

## 2022-06-09 ENCOUNTER — Ambulatory Visit: Payer: Managed Care, Other (non HMO) | Attending: Rheumatology | Admitting: Rheumatology

## 2022-06-09 ENCOUNTER — Ambulatory Visit: Payer: Managed Care, Other (non HMO)

## 2022-06-09 ENCOUNTER — Encounter: Payer: Self-pay | Admitting: Rheumatology

## 2022-06-09 VITALS — BP 115/88 | HR 90 | Resp 18 | Ht 66.0 in | Wt 161.2 lb

## 2022-06-09 DIAGNOSIS — M3500 Sicca syndrome, unspecified: Secondary | ICD-10-CM

## 2022-06-09 DIAGNOSIS — G250 Essential tremor: Secondary | ICD-10-CM

## 2022-06-09 DIAGNOSIS — M25552 Pain in left hip: Secondary | ICD-10-CM | POA: Diagnosis not present

## 2022-06-09 DIAGNOSIS — I7300 Raynaud's syndrome without gangrene: Secondary | ICD-10-CM | POA: Diagnosis not present

## 2022-06-09 DIAGNOSIS — M7062 Trochanteric bursitis, left hip: Secondary | ICD-10-CM

## 2022-06-09 DIAGNOSIS — M5136 Other intervertebral disc degeneration, lumbar region: Secondary | ICD-10-CM

## 2022-06-09 DIAGNOSIS — Z79899 Other long term (current) drug therapy: Secondary | ICD-10-CM | POA: Diagnosis not present

## 2022-06-09 DIAGNOSIS — F419 Anxiety disorder, unspecified: Secondary | ICD-10-CM

## 2022-06-09 DIAGNOSIS — K760 Fatty (change of) liver, not elsewhere classified: Secondary | ICD-10-CM

## 2022-06-09 DIAGNOSIS — E559 Vitamin D deficiency, unspecified: Secondary | ICD-10-CM

## 2022-06-09 DIAGNOSIS — F32A Depression, unspecified: Secondary | ICD-10-CM

## 2022-06-09 DIAGNOSIS — Z8659 Personal history of other mental and behavioral disorders: Secondary | ICD-10-CM

## 2022-06-09 DIAGNOSIS — M7061 Trochanteric bursitis, right hip: Secondary | ICD-10-CM

## 2022-06-09 DIAGNOSIS — R5383 Other fatigue: Secondary | ICD-10-CM

## 2022-06-09 DIAGNOSIS — M4124 Other idiopathic scoliosis, thoracic region: Secondary | ICD-10-CM

## 2022-06-09 DIAGNOSIS — I951 Orthostatic hypotension: Secondary | ICD-10-CM

## 2022-06-09 DIAGNOSIS — M359 Systemic involvement of connective tissue, unspecified: Secondary | ICD-10-CM

## 2022-06-09 MED ORDER — LIDOCAINE HCL 1 % IJ SOLN
1.5000 mL | INTRAMUSCULAR | Status: AC | PRN
  Administered 2022-06-09: 1.5 mL

## 2022-06-09 MED ORDER — TRIAMCINOLONE ACETONIDE 40 MG/ML IJ SUSP
40.0000 mg | INTRAMUSCULAR | Status: AC | PRN
  Administered 2022-06-09: 40 mg via INTRA_ARTICULAR

## 2022-06-09 NOTE — Patient Instructions (Addendum)
Standing Labs We placed an order today for your standing lab work.   Please have your standing labs drawn in September and every 3 months  If possible, please have your labs drawn 2 weeks prior to your appointment so that the provider can discuss your results at your appointment.  Please note that you may see your imaging and lab results in [facility] before we have reviewed them. We may be awaiting multiple results to interpret others before contacting you. Please allow our office up to 72 hours to thoroughly review all of the results before contacting the office for clarification of your results.  We currently have open lab daily: Monday through Thursday from 1:30 PM-4:30 PM and Friday from 1:30 PM- 4:00 PM If possible, please come for your lab work on Monday, Thursday or Friday afternoons, as you may experience shorter wait times.   Effective August 10, 2022 the new lab hours will change to: Monday through Thursday from 1:30 PM-5:00 PM and Friday from 8:30 AM-12:00 PM If possible, please come for your lab work on Monday and Thursday afternoons, as you may experience shorter wait times.  Please be advised, all patients with office appointments requiring lab work will take precedent over walk-in lab work.    The office is located at [address] [place], [place], [place] [address] No appointment is necessary.   Labs are drawn by [facility]. Please bring your co-pay at the time of your lab draw.  You may receive a bill from [facility] for your lab work.  Please note if you are on Hydroxychloroquine and and an order has been placed for a Hydroxychloroquine level, you will need to have it drawn 4 hours or more after your last dose.  If you wish to have your labs drawn at another location, please call the office 24 hours in advance to send orders.  If you have any questions regarding directions or hours of operation,  please call [phone].   As a reminder, please drink plenty of water prior  to coming for your lab work. Thanks!  Vaccines You are taking a medication(s) that can suppress your immune system.  The following immunizations are recommended: Flu annually Covid-19  Td/Tdap (tetanus, diphtheria, pertussis) every 10 years Pneumonia (Prevnar 15 then Pneumovax 23 at least 1 year apart.  Alternatively, can take Prevnar 20 without needing additional dose) Shingrix: 2 doses from 4 weeks to 6 months apart  Please check with your PCP to make sure you are up to date.   Iliotibial Band Syndrome Rehab Ask your health care provider which exercises are safe for you. Do exercises exactly as told by your health care provider and adjust them as directed. It is normal to feel mild stretching, pulling, tightness, or discomfort as you do these exercises. Stop right away if you feel sudden pain or your pain gets significantly worse. Do not begin these exercises until told by your health care provider. Stretching and range-of-motion exercises These exercises warm up your muscles and joints and improve the movement and flexibility of your hip and pelvis. Quadriceps stretch, prone  Lie on your abdomen (prone position) on a firm surface, such as a bed or padded floor. Bend your left / right knee and reach back to hold your ankle or pant leg. If you cannot reach your ankle or pant leg, loop a belt around your foot and grab the belt instead. Gently pull your heel toward your buttocks. Your knee should not slide out to the side.  You should feel a stretch in the front of your thigh and knee (quadriceps). Hold this position for __________ seconds. Repeat __________ times. Complete this exercise __________ times a day. Iliotibial band stretch An iliotibial band is a strong band of muscle tissue that runs from the outer side of your hip to the outer side of your thigh and knee. Lie on your side with your left / right leg in the top position. Bend both of your knees and grab your left / right ankle.  Stretch out your bottom arm to help you balance. Slowly bring your top knee back so your thigh goes behind your trunk. Slowly lower your top leg toward the floor until you feel a gentle stretch on the outside of your left / right hip and thigh. If you do not feel a stretch and your knee will not fall farther, place the heel of your other foot on top of your knee and pull your knee down toward the floor with your foot. Hold this position for __________ seconds. Repeat __________ times. Complete this exercise __________ times a day. Strengthening exercises These exercises build strength and endurance in your hip and pelvis. Endurance is the ability to use your muscles for a long time, even after they get tired. Straight leg raises, side-lying This exercise strengthens the muscles that rotate the leg at the hip and move it away from your body (hip abductors). Lie on your side with your left / right leg in the top position. Lie so your head, shoulder, hip, and knee line up. You may bend your bottom knee to help you balance. Roll your hips slightly forward so your hips are stacked directly over each other and your left / right knee is facing forward. Tense the muscles in your outer thigh and lift your top leg 4-6 inches (10-15 cm). Hold this position for __________ seconds. Slowly lower your leg to return to the starting position. Let your muscles relax completely before doing another repetition. Repeat __________ times. Complete this exercise __________ times a day. Leg raises, prone This exercise strengthens the muscles that move the hips backward (hip extensors). Lie on your abdomen (prone position) on your bed or a firm surface. You can put a pillow under your hips if that is more comfortable for your lower back. Bend your left / right knee so your foot is straight up in the air. Squeeze your buttocks muscles and lift your left / right thigh off the bed. Do not let your back arch. Tense your thigh  muscle as hard as you can without increasing any knee pain. Hold this position for __________ seconds. Slowly lower your leg to return to the starting position and allow it to relax completely. Repeat __________ times. Complete this exercise __________ times a day. Hip hike Stand sideways on a bottom step. Stand on your left / right leg with your other foot unsupported next to the step. You can hold on to a railing or wall for balance if needed. Keep your knees straight and your torso square. Then lift your left / right hip up toward the ceiling. Slowly let your left / right hip lower toward the floor, past the starting position. Your foot should get closer to the floor. Do not lean or bend your knees. Repeat __________ times. Complete this exercise __________ times a day. This information is not intended to replace advice given to you by your health care provider. Make sure you discuss any questions you have with your  health care provider. Document Revised: 12/04/2019 Document Reviewed: 12/04/2019 Elsevier Patient Education  [place].

## 2022-06-14 ENCOUNTER — Other Ambulatory Visit: Payer: Self-pay | Admitting: Rheumatology

## 2022-06-14 ENCOUNTER — Other Ambulatory Visit: Payer: Self-pay | Admitting: Physician Assistant

## 2022-06-14 NOTE — Telephone Encounter (Signed)
Next Visit: 12/08/2021  Last Visit: 06/09/2022  Last Fill: 03/21/2022  Dx: Raynaud's disease without gangrene   Current Dose per office note on 06/09/2022: amlodipine 5 mg 1 tablet by mouth daily  Okay to refill Amlodipine?

## 2022-06-14 NOTE — Telephone Encounter (Signed)
Next Visit: 12/08/2021   Last Visit: 06/09/2022   Last Fill: 03/14/2022  Dx: Undifferentiated connective tissue disease   Current Dose per office note on 06/09/2022:  Plaquenil 200 mg 1 tablet by mouth twice daily M-F only   PLQ Eye Exam 05/26/2022 WNL  Labs: 04/05/2022 Glucose 101  Okay to refill PLQ?

## 2022-07-14 ENCOUNTER — Encounter: Payer: Self-pay | Admitting: Cardiology

## 2022-07-14 ENCOUNTER — Ambulatory Visit: Payer: Managed Care, Other (non HMO) | Attending: Cardiology | Admitting: Cardiology

## 2022-07-14 VITALS — BP 116/86 | HR 88 | Ht 66.0 in | Wt 159.4 lb

## 2022-07-14 DIAGNOSIS — R002 Palpitations: Secondary | ICD-10-CM

## 2022-07-14 DIAGNOSIS — E782 Mixed hyperlipidemia: Secondary | ICD-10-CM

## 2022-07-14 NOTE — Patient Instructions (Signed)
Medication Instructions:  Your physician recommends that you continue on your current medications as directed. Please refer to the Current Medication list given to you today.  *If you need a refill on your cardiac medications before your next appointment, please call your pharmacy*   Lab Work: NONE If you have labs (blood work) drawn today and your tests are completely normal, you will receive your results only by: MyChart Message (if you have MyChart) OR A paper copy in the mail If you have any lab test that is abnormal or we need to change your treatment, we will call you to review the results.   Testing/Procedures: NONE   Follow-Up: At [facility], you and your health needs are our priority.  As part of our continuing mission to provide you with exceptional heart care, we have created designated Provider Care Teams.  These Care Teams include your primary Cardiologist (physician) and Advanced Practice Providers (APPs -  Physician Assistants and Nurse Practitioners) who all work together to provide you with the care you need, when you need it.  We recommend signing up for the patient portal called "MyChart".  Sign up information is provided on this After Visit Summary.  MyChart is used to connect with patients for Virtual Visits (Telemedicine).  Patients are able to view lab/test results, encounter notes, upcoming appointments, etc.  Non-urgent messages can be sent to your provider as well.   To learn more about what you can do with MyChart, go to [email or web address].    Your next appointment:   1 year(s)  The format for your next appointment:   In Person  Provider:   Kardie Tobb, DO   

## 2022-07-14 NOTE — Progress Notes (Signed)
Cardiology Office Note:    Date:  07/15/2022   ID:  Sharon Hartman, DOB 02/14/1981, MRN [id number]  PCP:  Sharon Ada, MD  Cardiologist:  Berniece Salines, DO  Electrophysiologist:  None   Referring MD: Sharon Ada, MD   " I am short of breath"  History of Present Illness:    Sharon Hartman is a 41 y.o. female with a hx of anxiety depression, hyperlipidemia, Raynaud's disease, recently mixed connective tissue disorder and lupus here today to be evaluated for significant palpitations shortness of breath.  The patient tells me that she has been experiencing intermittent palpitations.  With me she gets significantly short of breath.  She has not passed out but she is concerned.  She notes that intermittently heart rate can go up to the 160s sustained for a little bit and then resolved.  She tells me her stamina also is not like the way it used to be.   Past Medical History:  Diagnosis Date   ADD (attention deficit disorder)    Anxiety    Constipation    DDD (degenerative disc disease), lumbar    Depression    Fatty liver    Galactorrhea    bilateral nipple discharge   History of basal cell carcinoma (BCC) excision    back 2017 excision   History of cervical dysplasia    CIN II  s/p LEEP 06/ 2005   History of suicide attempt    2003; 2006   History of syncope    02/ 2015  w/ palpitations and pregnant--- evaluation by cardiology, dr Debara Pickett, note in epic 03/ 2015, work-up done w/ normal echo and event monitor showed SR with isolated PVCs/ PACs   Hyperlipidemia    Inappropriate sinus tachycardia    Low testosterone    Low vitamin D level    Lupus ([place])    Migraine    Numbness    bilateral hands and feet due to raynaurd's disease   Polyarthralgia    PONV (postoperative nausea and vomiting)    also lightweight when it comes to anesthesia   Raynaud's disease    rheumotology--- dr Estanislado Pandy--  with bilateral hand and feet numbness, and nipples per pt;  takes norvasc   Right  lower quadrant pain    Scoliosis    thoracic   Sicca syndrome ([place])    Tremor, unspecified    intermittant left hand/ wrist tremor with dorsiflexion  ( neurology evaluation by dr tat note in epic 08-15-2019, felt to be clonus)    Past Surgical History:  Procedure Laterality Date   DILATATION & CURETTAGE/HYSTEROSCOPY WITH MYOSURE N/A 08/23/2019   Procedure: [place];  Surgeon: Servando Salina, MD;  Location: [facility];  Service: Gynecology;  Laterality: N/A;   DILATION AND CURETTAGE OF UTERUS  1999   GANGLION CYST EXCISION Right 2011   foot   LAPAROSCOPIC LYSIS OF ADHESIONS N/A 12/23/2019   Procedure: DIAGNOSTIC LAPAROSCOPY WITH PERITONEAL BIOPSY;  Surgeon: Servando Salina, MD;  Location: [facility];  Service: Gynecology;  Laterality: N/A;  2 1/2 hrs.   LEEP  03-31-2004   '@[facility]'$    CIN II   TONSILLECTOMY  1998   WISDOM TOOTH EXTRACTION  1999    Current Medications: Current Meds  Medication Sig   albuterol (VENTOLIN HFA) 108 (90 Base) MCG/ACT inhaler albuterol sulfate HFA 90 mcg/actuation aerosol inhaler   amLODipine (NORVASC) 5 MG tablet TAKE 1 TABLET BY MOUTH EVERYDAY AT BEDTIME  amphetamine-dextroamphetamine (ADDERALL) 10 MG tablet Take 10 mg by mouth 2 (two) times daily.   aspirin EC 81 MG tablet Take 81 mg by mouth daily.   buPROPion (WELLBUTRIN SR) 150 MG 12 hr tablet Take 150 mg by mouth daily.   desvenlafaxine (PRISTIQ) 50 MG 24 hr tablet Take 50 mg by mouth daily.   EMGALITY 120 MG/ML SOAJ Inject 1 mL into the skin every 30 (thirty) days.   gabapentin (NEURONTIN) 300 MG capsule Take 300 mg by mouth daily.   hydroxychloroquine (PLAQUENIL) 200 MG tablet TAKE 1 TABLET BY MOUTH TWICE DAILY MONDAY THROUGH FRIDAY.   ibuprofen (ADVIL) 800 MG tablet Take 1 tablet (800 mg total) by mouth every 8 (eight) hours as needed for cramping.   loratadine (CLARITIN) 10 MG tablet Take 10 mg by mouth daily.    LORazepam (ATIVAN) 0.5 MG tablet as needed.   Magnesium 250 MG TABS Take 250 mg by mouth daily.    midodrine (PROAMATINE) 5 MG tablet Take 1 tablet (5 mg total) by mouth 3 (three) times daily with meals. (Patient taking differently: Take 5 mg by mouth as needed.)   Multiple Vitamins-Minerals (MULTIVITAMIN ADULT PO) Take 1 tablet by mouth daily.    norethindrone-ethinyl estradiol (LOESTRIN) 1-20 MG-MCG tablet Take 1 tablet by mouth daily.   ondansetron (ZOFRAN) 4 MG tablet as needed.   Probiotic Product (PROBIOTIC PO) Take 1 capsule by mouth daily.    propranolol (INDERAL) 10 MG tablet Take 1 tablet (10 mg total) by mouth every 8 (eight) hours as needed (if heart rate is over 150 bpm for over 15 minutes).   Riboflavin 400 MG TABS Take 400 mg by mouth daily.    SENNA PO Take by mouth daily.   sertraline (ZOLOFT) 100 MG tablet Take 100 mg by mouth daily.     Allergies:   Patient has no known allergies.   Social History   Socioeconomic History   Marital status: Legally Separated    Spouse name: Not on file   Number of children: 1   Years of education: college   Highest education level: Not on file  Occupational History   Occupation: RN  Tobacco Use   Smoking status: Former    Packs/day: 0.20    Years: 7.00    Total pack years: 1.40    Types: Cigarettes    Quit date: 08/20/2004    Years since quitting: 17.9    Passive exposure: Past   Smokeless tobacco: Never  Vaping Use   Vaping Use: Never used  Substance and Sexual Activity   Alcohol use: Yes    Comment: rarely   Drug use: Never   Sexual activity: Yes    Birth control/protection: None  Other Topics Concern   Not on file  Social History Narrative   Lives at home with husband and son.   Right-handed.   No caffeine use.   Social Determinants of Health   Financial Resource Strain: Not on file  Food Insecurity: Not on file  Transportation Needs: Not on file  Physical Activity: Sufficiently Active (10/19/2017)   Exercise  Vital Sign    Days of Exercise per Week: 4 days    Minutes of Exercise per Session: 40 min  Stress: Stress Concern Present (10/19/2017)   [place]    Feeling of Stress : Rather much  Social Connections: Somewhat Isolated (10/19/2017)   Social Connection and Isolation Panel [NHANES]    Frequency of Communication with  Friends and Family: More than three times a week    Frequency of Social Gatherings with Friends and Family: Once a week    Attends Religious Services: Never    Marine scientist or Organizations: No    Attends Music therapist: Never    Marital Status: Living with partner     Family History: The patient's family history includes Anxiety disorder in her sister; Autism in her son; Breast cancer in her paternal grandmother and sister; Depression in her sister; Diabetes in her mother; Drug abuse in her brother; Hashimoto's thyroiditis in her sister; Heart attack in her paternal grandfather; Heart disease in her paternal grandfather and paternal grandmother; Heart murmur in her mother; Hepatitis in her mother; Hyperlipidemia in her father, paternal grandfather, and paternal grandmother; Hypertension in her father, paternal grandfather, and paternal grandmother; Scoliosis in her brother.  ROS:   Review of Systems  Constitution: Negative for decreased appetite, fever and weight gain.  HENT: Negative for congestion, ear discharge, hoarse voice and sore throat.   Eyes: Negative for discharge, redness, vision loss in right eye and visual halos.  Cardiovascular: Negative for chest pain, dyspnea on exertion, leg swelling, orthopnea and palpitations.  Respiratory: Negative for cough, hemoptysis, shortness of breath and snoring.   Endocrine: Negative for heat intolerance and polyphagia.  Hematologic/Lymphatic: Negative for bleeding problem. Does not bruise/bleed easily.  Skin: Negative for flushing, nail  changes, rash and suspicious lesions.  Musculoskeletal: Negative for arthritis, joint pain, muscle cramps, myalgias, neck pain and stiffness.  Gastrointestinal: Negative for abdominal pain, bowel incontinence, diarrhea and excessive appetite.  Genitourinary: Negative for decreased libido, genital sores and incomplete emptying.  Neurological: Negative for brief paralysis, focal weakness, headaches and loss of balance.  Psychiatric/Behavioral: Negative for altered mental status, depression and suicidal ideas.  Allergic/Immunologic: Negative for HIV exposure and persistent infections.    EKGs/Labs/Other Studies Reviewed:    The following studies were reviewed today:   EKG:  The ekg ordered today demonstrates   Recent Labs: 03/18/2022: ALT 14 04/05/2022: BUN 16; Creatinine, Ser 0.82; Hemoglobin 14.3; Magnesium 2.2; Platelets 356; Potassium 4.1; Sodium 142; TSH 0.868  Recent Lipid Panel No results found for: "CHOL", "TRIG", "HDL", "CHOLHDL", "VLDL", "LDLCALC", "LDLDIRECT"  Physical Exam:    VS:  BP 116/86   Pulse 88   Ht '5\' 6"'$  (1.676 m)   Wt 159 lb 6.4 oz (72.3 kg)   SpO2 98%   BMI 25.73 kg/m     Wt Readings from Last 3 Encounters:  07/14/22 159 lb 6.4 oz (72.3 kg)  06/09/22 161 lb 3.2 oz (73.1 kg)  04/05/22 165 lb (74.8 kg)     GEN: Well nourished, well developed in no acute distress HEENT: Normal NECK: No JVD; No carotid bruits LYMPHATICS: No lymphadenopathy CARDIAC: S1S2 noted,RRR, no murmurs, rubs, gallops RESPIRATORY:  Clear to auscultation without rales, wheezing or rhonchi  ABDOMEN: Soft, non-tender, non-distended, +bowel sounds, no guarding. EXTREMITIES: No edema, No cyanosis, no clubbing MUSCULOSKELETAL:  No deformity  SKIN: Warm and dry NEUROLOGIC:  Alert and oriented x 3, non-focal PSYCHIATRIC:  Normal affect, good insight  ASSESSMENT:    1. Palpitations   2. Mixed hyperlipidemia     PLAN:     She still reports that she is experiencing significant  fatigue.  Which could be likely part of her chronic mixed connective tissue disorder.  Not sure if chronic fatigue syndrome may be playing a role here due to her autonomic dysfunction.  I have asked  the patient to discuss with her rheumatologist as well.  In terms of her heart rate she tells me this is going up and down.  Her blood pressure drops at times with the propanolol so we have this as needed.  We will continue to monitor the patient.   The patient is in agreement with the above plan. The patient left the office in stable condition.  The patient will follow up in 12 months or sooner if needed.   Medication Adjustments/Labs and Tests Ordered: Current medicines are reviewed at length with the patient today.  Concerns regarding medicines are outlined above.  No orders of the defined types were placed in this encounter.  No orders of the defined types were placed in this encounter.   Patient Instructions  Medication Instructions:  Your physician recommends that you continue on your current medications as directed. Please refer to the Current Medication list given to you today.  *If you need a refill on your cardiac medications before your next appointment, please call your pharmacy*   Lab Work: NONE If you have labs (blood work) drawn today and your tests are completely normal, you will receive your results only by: [place] (if you have MyChart) OR A paper copy in the mail If you have any lab test that is abnormal or we need to change your treatment, we will call you to review the results.   Testing/Procedures: NONE   Follow-Up: At [facility], you and your health needs are our priority.  As part of our continuing mission to provide you with exceptional heart care, we have created designated Provider Care Teams.  These Care Teams include your primary Cardiologist (physician) and Advanced Practice Providers (APPs -  Physician Assistants and Nurse Practitioners)  who all work together to provide you with the care you need, when you need it.  We recommend signing up for the patient portal called "MyChart".  Sign up information is provided on this After Visit Summary.  MyChart is used to connect with patients for Virtual Visits (Telemedicine).  Patients are able to view lab/test results, encounter notes, upcoming appointments, etc.  Non-urgent messages can be sent to your provider as well.   To learn more about what you can do with MyChart, go to [email or web address].    Your next appointment:   1 year(s)  The format for your next appointment:   In Person  Provider:   Berniece Salines, DO      Adopting a Healthy Lifestyle.  Know what a healthy weight is for you (roughly BMI <25) and aim to maintain this   Aim for 7+ servings of fruits and vegetables daily   65-80+ fluid ounces of water or unsweet tea for healthy kidneys   Limit to max 1 drink of alcohol per day; avoid smoking/tobacco   Limit animal fats in diet for cholesterol and heart health - choose grass fed whenever available   Avoid highly processed foods, and foods high in saturated/trans fats   Aim for low stress - take time to unwind and care for your mental health   Aim for 150 min of moderate intensity exercise weekly for heart health, and weights twice weekly for bone health   Aim for 7-9 hours of sleep daily   When it comes to diets, agreement about the perfect plan isnt easy to find, even among the experts. Experts at the [employer] developed an idea known as the Healthy Eating Plate. Just imagine a  plate divided into logical, healthy portions.   The emphasis is on diet quality:   Load up on vegetables and fruits - one-half of your plate: Aim for color and variety, and remember that potatoes dont count.   Go for whole grains - one-quarter of your plate: Whole wheat, barley, wheat berries, quinoa, oats, brown rice, and foods made with them. If you want  pasta, go with whole wheat pasta.   Protein power - one-quarter of your plate: Fish, chicken, beans, and nuts are all healthy, versatile protein sources. Limit red meat.   The diet, however, does go beyond the plate, offering a few other suggestions.   Use healthy plant oils, such as olive, canola, soy, corn, sunflower and peanut. Check the labels, and avoid partially hydrogenated oil, which have unhealthy trans fats.   If youre thirsty, drink water. Coffee and tea are good in moderation, but skip sugary drinks and limit milk and dairy products to one or two daily servings.   The type of carbohydrate in the diet is more important than the amount. Some sources of carbohydrates, such as vegetables, fruits, whole grains, and beans-are healthier than others.   Finally, stay active  Signed, Berniece Salines, DO  07/15/2022 9:48 PM    [facility]

## 2022-08-10 ENCOUNTER — Encounter: Payer: Self-pay | Admitting: Cardiology

## 2022-08-23 IMAGING — MG MM DIGITAL SCREENING BILAT W/ TOMO AND CAD
6 of 10 series · 6 of 30 positions shown · non-contrast
Comparison: Previous exam(s).

CLINICAL DATA: Screening.

EXAM:
DIGITAL SCREENING BILATERAL MAMMOGRAM WITH TOMOSYNTHESIS AND CAD
TECHNIQUE: Bilateral screening digital craniocaudal and mediolateral oblique
mammograms were obtained. Bilateral screening digital breast
tomosynthesis was performed. The images were evaluated with
computer-aided detection.

[L CC synth-2D]
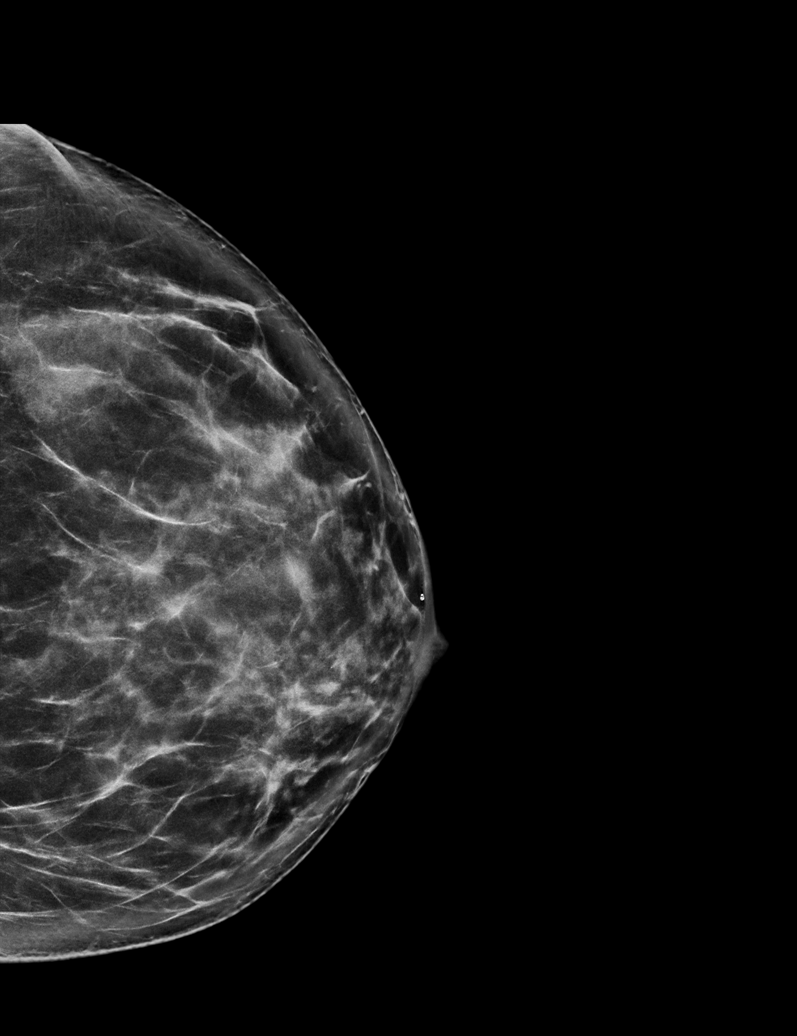

[R MLO synth-2D (1 of 2)]
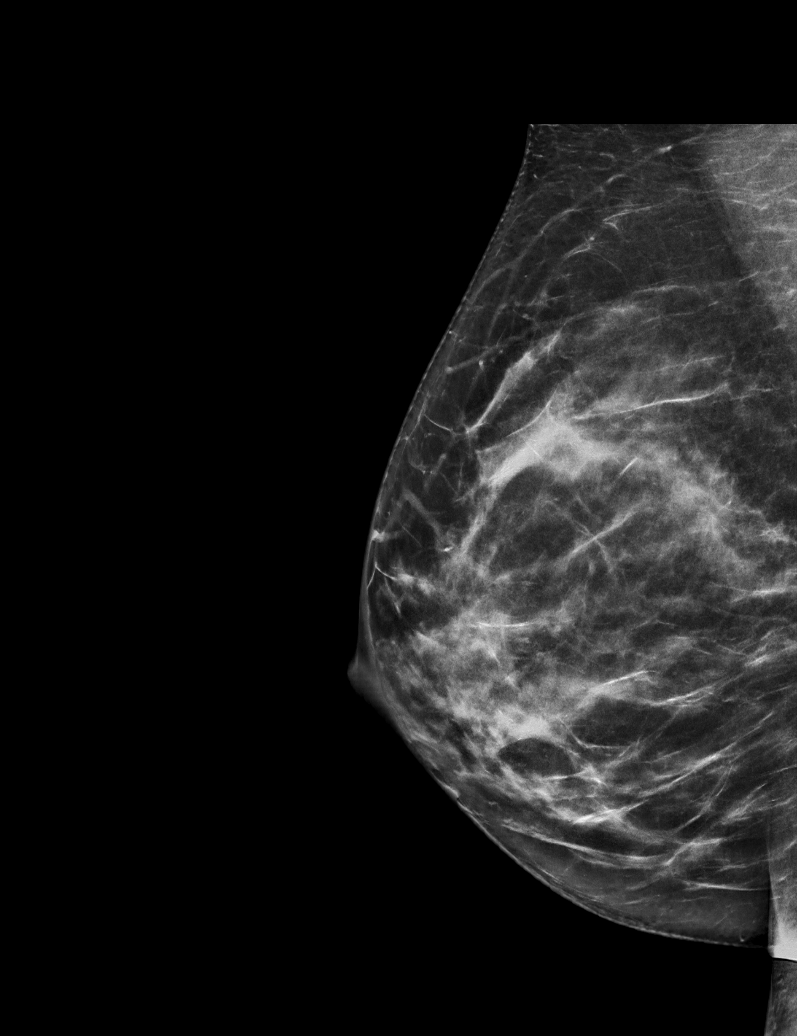

[L MLO synth-2D]
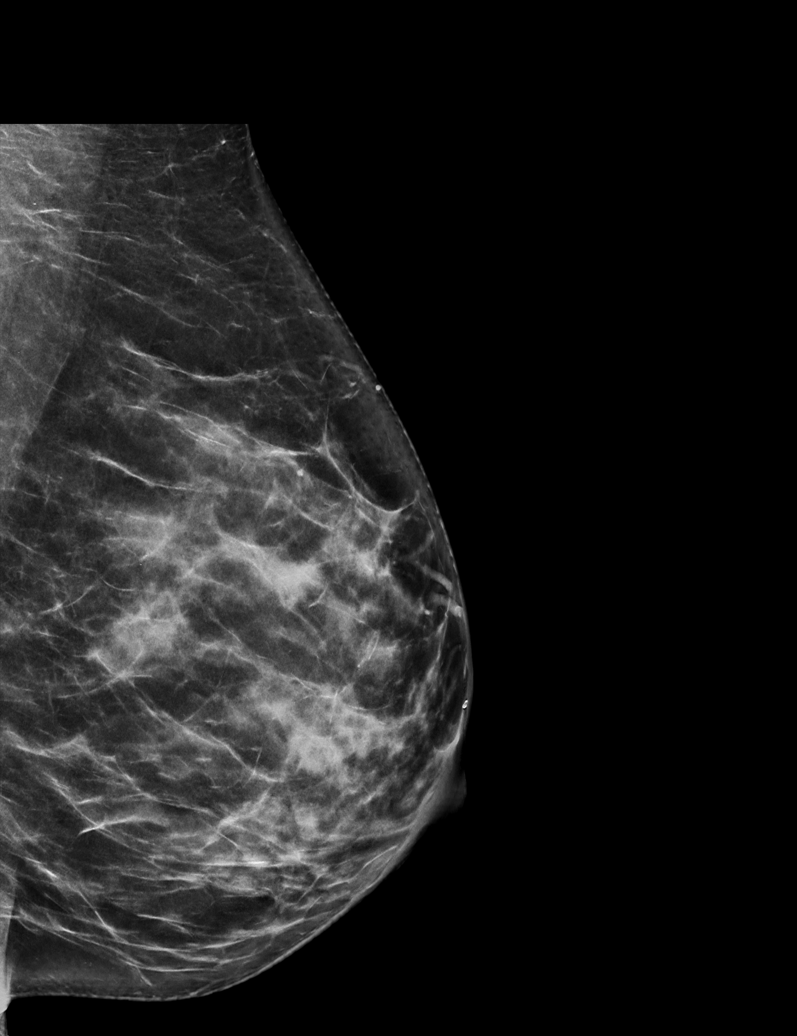

[R CC synth-2D]
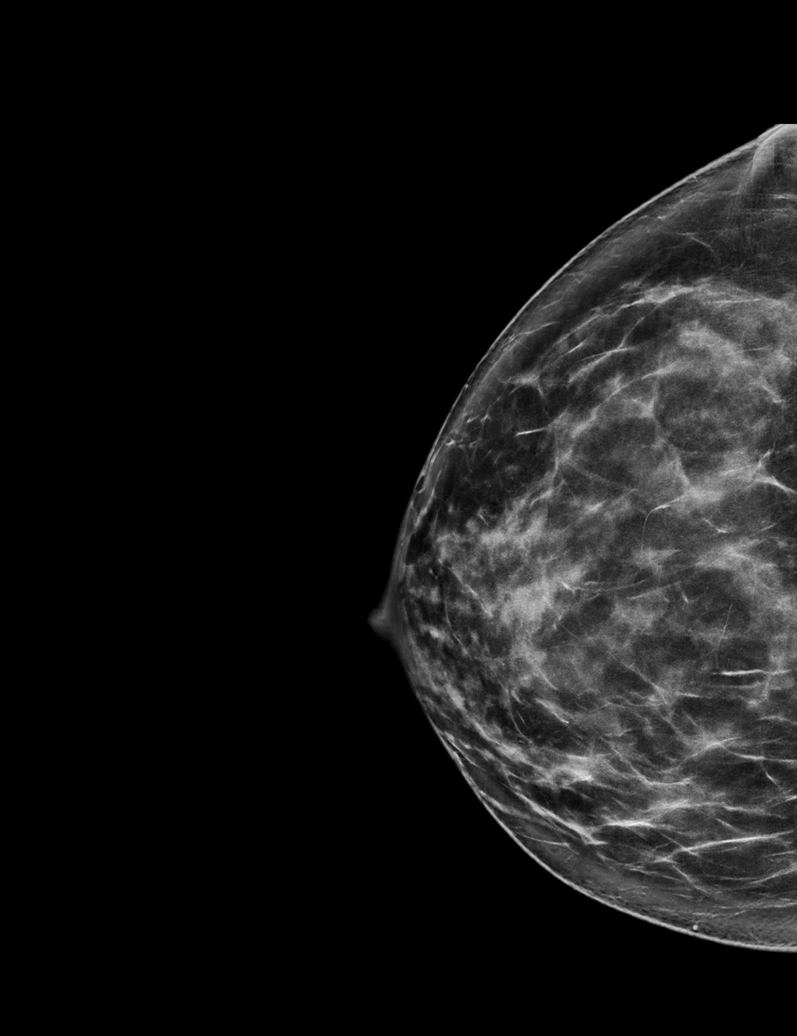

[R MLO synth-2D (2 of 2)]
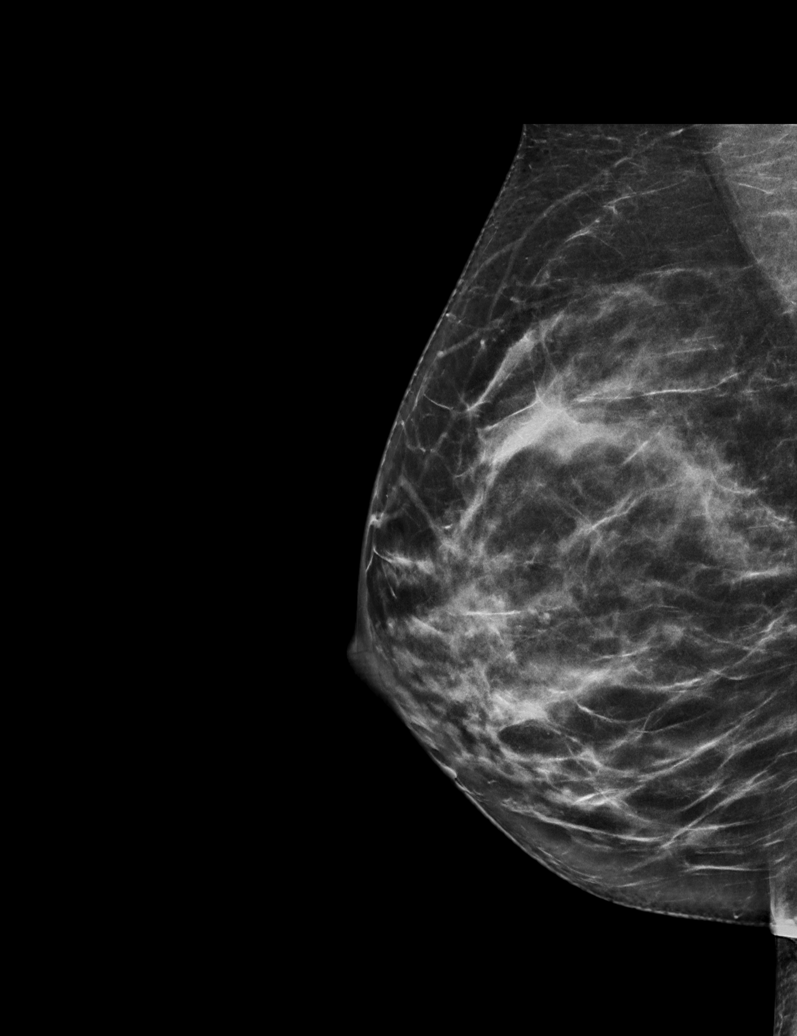

[R CC tomo · tomo slice 37/72.0]
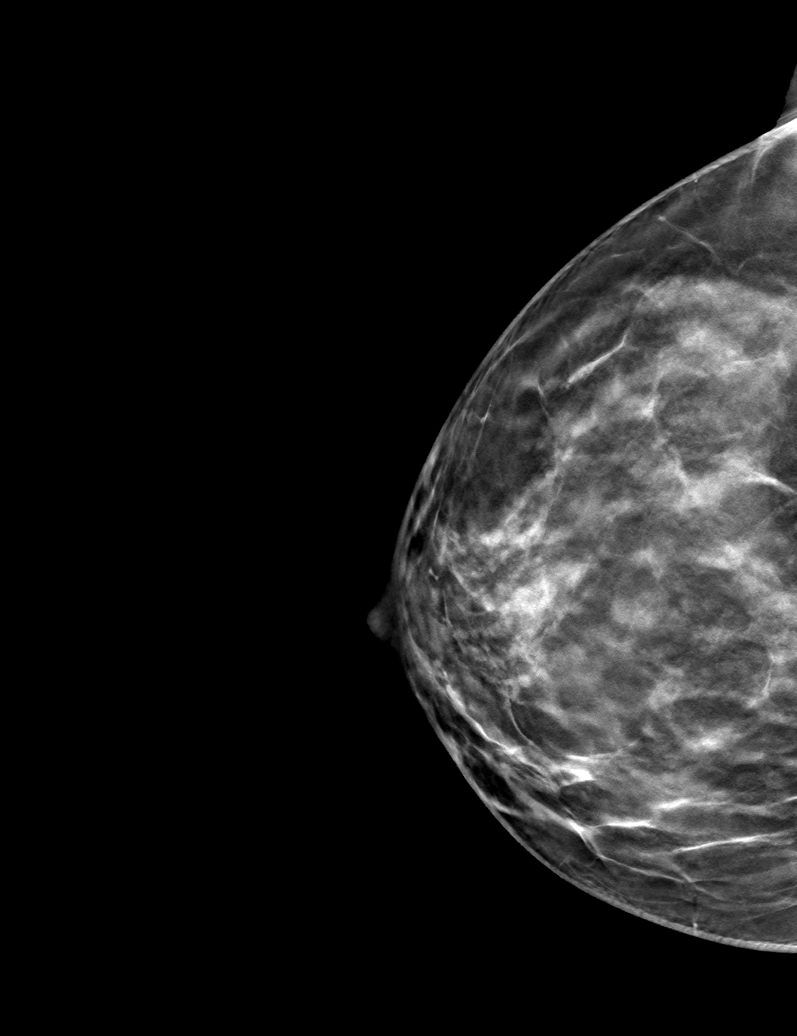

[6 of 30 positions shown; findings below may reference images not displayed]

ACR Breast Density Category c: The breast tissue is heterogeneously
dense, which may obscure small masses.
FINDINGS: There are no findings suspicious for malignancy.
IMPRESSION: No mammographic evidence of malignancy. A result letter of this
screening mammogram will be mailed directly to the patient.

RECOMMENDATION:
Screening mammogram in one year. (Code:[id number])

BI-RADS CATEGORY  1: Negative.

## 2022-08-26 ENCOUNTER — Other Ambulatory Visit: Payer: Self-pay | Admitting: Physician Assistant

## 2022-08-26 ENCOUNTER — Encounter: Payer: Self-pay | Admitting: *Deleted

## 2022-08-26 NOTE — Telephone Encounter (Signed)
Next Visit: 12/08/2021   Last Visit: 06/09/2022   Last Fill: 06/14/2022   Dx: Undifferentiated connective tissue disease   Current Dose per office note on 06/09/2022:  Plaquenil 200 mg 1 tablet by mouth twice daily M-F only    PLQ Eye Exam 05/26/2022 WNL   Labs: 04/05/2022 Glucose 101  Message sent to patient via my chart to advise patient she is due to update labs.    Okay to refill PLQ?

## 2022-08-29 DIAGNOSIS — Z79899 Other long term (current) drug therapy: Secondary | ICD-10-CM | POA: Diagnosis not present

## 2022-08-29 DIAGNOSIS — M545 Low back pain, unspecified: Secondary | ICD-10-CM | POA: Diagnosis not present

## 2022-08-29 DIAGNOSIS — G43009 Migraine without aura, not intractable, without status migrainosus: Secondary | ICD-10-CM | POA: Diagnosis not present

## 2022-09-08 ENCOUNTER — Other Ambulatory Visit: Payer: Self-pay | Admitting: *Deleted

## 2022-09-08 DIAGNOSIS — Z79899 Other long term (current) drug therapy: Secondary | ICD-10-CM | POA: Diagnosis not present

## 2022-09-08 DIAGNOSIS — E559 Vitamin D deficiency, unspecified: Secondary | ICD-10-CM

## 2022-09-08 DIAGNOSIS — M359 Systemic involvement of connective tissue, unspecified: Secondary | ICD-10-CM | POA: Diagnosis not present

## 2022-09-09 ENCOUNTER — Other Ambulatory Visit: Payer: Self-pay | Admitting: *Deleted

## 2022-09-09 DIAGNOSIS — E559 Vitamin D deficiency, unspecified: Secondary | ICD-10-CM

## 2022-09-09 LAB — CBC WITH DIFFERENTIAL/PLATELET
Absolute Monocytes: 269 cells/uL (ref 200–950)
Basophils Absolute: 58 cells/uL (ref 0–200)
Basophils Relative: 0.9 %
Eosinophils Absolute: 122 cells/uL (ref 15–500)
Eosinophils Relative: 1.9 %
HCT: 43.4 % (ref 35.0–45.0)
Hemoglobin: 14.9 g/dL (ref 11.7–15.5)
Lymphs Abs: 2528 cells/uL (ref 850–3900)
MCH: 31.8 pg (ref 27.0–33.0)
MCHC: 34.3 g/dL (ref 32.0–36.0)
MCV: 92.7 fL (ref 80.0–100.0)
MPV: 9.7 fL (ref 7.5–12.5)
Monocytes Relative: 4.2 %
Neutro Abs: 3424 cells/uL (ref 1500–7800)
Neutrophils Relative %: 53.5 %
Platelets: 305 10*3/uL (ref 140–400)
RBC: 4.68 10*6/uL (ref 3.80–5.10)
RDW: 11.8 % (ref 11.0–15.0)
Total Lymphocyte: 39.5 %
WBC: 6.4 10*3/uL (ref 3.8–10.8)

## 2022-09-09 LAB — COMPLETE METABOLIC PANEL WITH GFR
AG Ratio: 1.9 (calc) (ref 1.0–2.5)
ALT: 14 U/L (ref 6–29)
AST: 18 U/L (ref 10–30)
Albumin: 4.9 g/dL (ref 3.6–5.1)
Alkaline phosphatase (APISO): 40 U/L (ref 31–125)
BUN: 12 mg/dL (ref 7–25)
CO2: 22 mmol/L (ref 20–32)
Calcium: 9.8 mg/dL (ref 8.6–10.2)
Chloride: 104 mmol/L (ref 98–110)
Creat: 0.64 mg/dL (ref 0.50–0.99)
Globulin: 2.6 g/dL (calc) (ref 1.9–3.7)
Glucose, Bld: 82 mg/dL (ref 65–99)
Potassium: 4 mmol/L (ref 3.5–5.3)
Sodium: 138 mmol/L (ref 135–146)
Total Bilirubin: 0.5 mg/dL (ref 0.2–1.2)
Total Protein: 7.5 g/dL (ref 6.1–8.1)
eGFR: 115 mL/min/{1.73_m2} (ref >=60)

## 2022-09-09 LAB — C3 AND C4
C3 Complement: 114 mg/dL (ref 83–193)
C4 Complement: 27 mg/dL (ref 15–57)

## 2022-09-09 LAB — PROTEIN / CREATININE RATIO, URINE
Creatinine, Urine: 39 mg/dL (ref 20–275)
Total Protein, Urine: <4 mg/dL — ABNORMAL LOW (ref 5–24)

## 2022-09-09 LAB — VITAMIN D 25 HYDROXY (VIT D DEFICIENCY, FRACTURES): Vit D, 25-Hydroxy: 25 ng/mL — ABNORMAL LOW (ref 30–100)

## 2022-09-09 LAB — ANTI-DNA ANTIBODY, DOUBLE-STRANDED: ds DNA Ab: <1 IU/mL

## 2022-09-09 LAB — SEDIMENTATION RATE: Sed Rate: 9 mm/h (ref 0–20)

## 2022-09-09 LAB — ANA: Anti Nuclear Antibody (ANA): NEGATIVE

## 2022-09-09 MED ORDER — VITAMIN D (ERGOCALCIFEROL) 1.25 MG (50000 UNIT) PO CAPS: 50000.0000 [IU] | ORAL_CAPSULE | ORAL | 0 refills | Status: DC

## 2022-09-09 NOTE — Telephone Encounter (Signed)
-----  Message from Ofilia Neas, PA-C sent at 09/09/2022 11:55 AM EST ----- CBC and CMP WNL.  Vitamin D is low-25. Please notify the patient and send in vitamin D 50,000 units once weekly x3 months. Recheck in 3 months.  No proteinuria.  ESR WNL.  Complements WNL.

## 2022-09-26 ENCOUNTER — Other Ambulatory Visit: Payer: Self-pay | Admitting: *Deleted

## 2022-09-26 MED ORDER — HYDROXYCHLOROQUINE SULFATE 200 MG PO TABS: ORAL_TABLET | ORAL | 0 refills | Status: DC

## 2022-09-26 NOTE — Telephone Encounter (Signed)
Refill request received via fax from [facility] for [place].  Next Visit: 12/13/2022  Last Visit: 06/09/2022  Labs: 09/08/2022 CBC and CMP WNL.   Eye exam: 05/26/2022 WNL    Current Dose per office note 06/09/2022: Plaquenil 200 mg 1 tablet by mouth twice daily M-F only   [id number] connective tissue disease   Last Fill: 08/26/2022  Okay to refill Plaquenil?

## 2022-10-16 ENCOUNTER — Encounter (HOSPITAL_COMMUNITY): Payer: Self-pay | Admitting: Pharmacy Technician

## 2022-10-16 ENCOUNTER — Emergency Department (HOSPITAL_COMMUNITY): Payer: BC Managed Care – PPO

## 2022-10-16 ENCOUNTER — Emergency Department (HOSPITAL_COMMUNITY)
Admission: EM | Admit: 2022-10-16 | Discharge: 2022-10-17 | Disposition: A | Discharge status: Home / Self Care | Payer: BC Managed Care – PPO | Attending: Emergency Medicine | Admitting: Emergency Medicine

## 2022-10-16 ENCOUNTER — Other Ambulatory Visit: Payer: Self-pay

## 2022-10-16 DIAGNOSIS — K5730 Diverticulosis of large intestine without perforation or abscess without bleeding: Secondary | ICD-10-CM | POA: Diagnosis not present

## 2022-10-16 DIAGNOSIS — Z7982 Long term (current) use of aspirin: Secondary | ICD-10-CM | POA: Diagnosis not present

## 2022-10-16 DIAGNOSIS — R1084 Generalized abdominal pain: Secondary | ICD-10-CM

## 2022-10-16 DIAGNOSIS — R1011 Right upper quadrant pain: Secondary | ICD-10-CM | POA: Diagnosis not present

## 2022-10-16 DIAGNOSIS — R1031 Right lower quadrant pain: Secondary | ICD-10-CM | POA: Diagnosis not present

## 2022-10-16 LAB — COMPREHENSIVE METABOLIC PANEL
ALT: 21 U/L (ref 0–44)
AST: 23 U/L (ref 15–41)
Albumin: 4.4 g/dL (ref 3.5–5.0)
Alkaline Phosphatase: 39 U/L (ref 38–126)
Anion gap: 11 (ref 5–15)
BUN: 6 mg/dL (ref 6–20)
CO2: 23 mmol/L (ref 22–32)
Calcium: 9.7 mg/dL (ref 8.9–10.3)
Chloride: 102 mmol/L (ref 98–111)
Creatinine, Ser: 0.7 mg/dL (ref 0.44–1.00)
GFR, Estimated: >60 mL/min (ref >60)
Glucose, Bld: 90 mg/dL (ref 70–99)
Potassium: 3.6 mmol/L (ref 3.5–5.1)
Sodium: 136 mmol/L (ref 135–145)
Total Bilirubin: 0.5 mg/dL (ref 0.3–1.2)
Total Protein: 6.7 g/dL (ref 6.5–8.1)

## 2022-10-16 LAB — CBC WITH DIFFERENTIAL/PLATELET
Abs Immature Granulocytes: 0.02 10*3/uL (ref 0.00–0.07)
Basophils Absolute: 0.1 10*3/uL (ref 0.0–0.1)
Basophils Relative: 1 %
Eosinophils Absolute: 0.1 10*3/uL (ref 0.0–0.5)
Eosinophils Relative: 2 %
HCT: 42.1 % (ref 36.0–46.0)
Hemoglobin: 14.2 g/dL (ref 12.0–15.0)
Immature Granulocytes: 0 %
Lymphocytes Relative: 34 %
Lymphs Abs: 2.6 10*3/uL (ref 0.7–4.0)
MCH: 31.5 pg (ref 26.0–34.0)
MCHC: 33.7 g/dL (ref 30.0–36.0)
MCV: 93.3 fL (ref 80.0–100.0)
Monocytes Absolute: 0.5 10*3/uL (ref 0.1–1.0)
Monocytes Relative: 6 %
Neutro Abs: 4.3 10*3/uL (ref 1.7–7.7)
Neutrophils Relative %: 57 %
Platelets: 349 10*3/uL (ref 150–400)
RBC: 4.51 MIL/uL (ref 3.87–5.11)
RDW: 12 % (ref 11.5–15.5)
WBC: 7.5 10*3/uL (ref 4.0–10.5)
nRBC: 0 % (ref 0.0–0.2)

## 2022-10-16 LAB — URINALYSIS, ROUTINE W REFLEX MICROSCOPIC
Bilirubin Urine: NEGATIVE
Glucose, UA: NEGATIVE mg/dL
Hgb urine dipstick: NEGATIVE
Ketones, ur: NEGATIVE mg/dL
Leukocytes,Ua: NEGATIVE
Nitrite: NEGATIVE
Protein, ur: NEGATIVE mg/dL
Specific Gravity, Urine: 1.004 — ABNORMAL LOW (ref 1.005–1.030)
pH: 6 (ref 5.0–8.0)

## 2022-10-16 LAB — I-STAT BETA HCG BLOOD, ED (MC, WL, AP ONLY): I-stat hCG, quantitative: <5 m[IU]/mL (ref <5)

## 2022-10-16 LAB — LIPASE, BLOOD: Lipase: 38 U/L (ref 11–51)

## 2022-10-16 NOTE — ED Provider Triage Note (Signed)
Emergency Medicine Provider Triage Evaluation Note  Sharon Hartman , a 42 y.o. female  was evaluated in triage.  Pt complains of right lower abdominal pain seems that her symptoms came on yesterday and have progressively worsened she endorses some nausea but no vomiting.  Review of Systems  Positive: Abdominal pain, nausea Negative: Fever  Physical Exam  BP 123/70   Pulse (!) 105   Temp 98.1 F (36.7 C)   Resp 16   SpO2 97%  Gen:   Awake, no distress   Resp:  Normal effort  MSK:   Moves extremities without difficulty  Other:  Right lower quadrant and right upper quadrant abdominal tenderness.  Medical Decision Making  Medically screening exam initiated at 11:28 PM.  Appropriate orders placed.  Sharon Hartman was informed that the remainder of the evaluation will be completed by another provider, this initial triage assessment does not replace that evaluation, and the importance of remaining in the ED until their evaluation is complete.  CT abdomen pelvis added   Tedd Sias, [place] 10/16/22 2330

## 2022-10-16 NOTE — ED Triage Notes (Signed)
Pt with nausea, fatigue, no appetite onset yesterday. Today pt with progressively worsening abdominal/groin pain and bloating.

## 2022-10-16 NOTE — ED Provider Triage Note (Signed)
Emergency Medicine Provider Triage Evaluation Note  Sharon Hartman , a 42 y.o. female  was evaluated in triage.  Pt complains of worsening intermittent abdominal pain with nausea over the last 3 days.  Pain located near the right lower quadrant.  Discoloration of urine, though recently started B vitamins.  Denies dysuria, changes in bowel habits, or vomiting.  Denies fevers.  IUD in place.  Review of Systems  Positive:  Negative: See above  Physical Exam  BP 132/74 (BP Location: Left Arm)   Pulse (!) 107   Temp 98.1 F (36.7 C)   Resp 17   SpO2 100%  Gen:   Awake, no distress   Resp:  Normal effort  MSK:   Moves extremities without difficulty  Other:  RLQ tenderness, abdomen soft, nondistended.  Sitting comfortably.  Medical Decision Making  Medically screening exam initiated at 6:46 PM.  Appropriate orders placed.  Sharon Hartman was informed that the remainder of the evaluation will be completed by another provider, this initial triage assessment does not replace that evaluation, and the importance of remaining in the ED until their evaluation is complete.     Prince Rome, PA-C 83/41/96 [id number]

## 2022-10-17 DIAGNOSIS — R1011 Right upper quadrant pain: Secondary | ICD-10-CM | POA: Diagnosis not present

## 2022-10-17 DIAGNOSIS — K5730 Diverticulosis of large intestine without perforation or abscess without bleeding: Secondary | ICD-10-CM | POA: Diagnosis not present

## 2022-10-17 DIAGNOSIS — R1031 Right lower quadrant pain: Secondary | ICD-10-CM | POA: Diagnosis not present

## 2022-10-17 MED ORDER — ONDANSETRON HCL 4 MG/2ML IJ SOLN
4.0000 mg | Freq: Once | INTRAMUSCULAR | Status: AC
  Administered 2022-10-17: 4 mg via INTRAVENOUS
  Filled 2022-10-17: qty 2

## 2022-10-17 MED ORDER — IOHEXOL 350 MG/ML SOLN
75.0000 mL | Freq: Once | INTRAVENOUS | Status: AC | PRN
  Administered 2022-10-17: 75 mL via INTRAVENOUS

## 2022-10-17 NOTE — ED Notes (Signed)
Patient called for vitals, will obtain when pt returns from CT

## 2022-10-17 NOTE — ED Provider Notes (Signed)
[facility] EMERGENCY DEPARTMENT Provider Note   CSN: [id number] Arrival date & time: 10/16/22  1827     History  Chief Complaint  Patient presents with   Abdominal Pain    Sharon Hartman is a 42 y.o. female.  The history is provided by the patient.  Abdominal Pain Pain location:  RLQ Pain quality: bloating   Pain severity:  Moderate Onset quality:  Gradual Duration:  1 day Timing:  Constant Progression:  Unchanged Chronicity:  New Worsened by:  Movement Associated symptoms: no constipation, no diarrhea, no dysuria, no fever, no hematochezia and no vomiting   Patient with history of lupus, anxiety, ADD presents with abdominal pain and fatigue.  Patient reports over the past day she has had generalized abdominal bloating and right lower quadrant abdominal pain.  She reports nausea without vomiting.  No diarrhea, no constipation or change in her bowel movements.  No dysuria.  No pelvic complaints.  She reports she feels full and bloated.    Past Medical History:  Diagnosis Date   ADD (attention deficit disorder)    Anxiety    Constipation    DDD (degenerative disc disease), lumbar    Depression    Fatty liver    Galactorrhea    bilateral nipple discharge   History of basal cell carcinoma (BCC) excision    back 2017 excision   History of cervical dysplasia    CIN II  s/p LEEP 06/ 2005   History of suicide attempt    2003; 2006   History of syncope    02/ 2015  w/ palpitations and pregnant--- evaluation by cardiology, dr Debara Pickett, note in epic 03/ 2015, work-up done w/ normal echo and event monitor showed SR with isolated PVCs/ PACs   Hyperlipidemia    Inappropriate sinus tachycardia    Low testosterone    Low vitamin D level    Lupus ([place])    Migraine    Numbness    bilateral hands and feet due to raynaurd's disease   Polyarthralgia    PONV (postoperative nausea and vomiting)    also lightweight when it comes to anesthesia   Raynaud's disease     rheumotology--- dr Estanislado Pandy--  with bilateral hand and feet numbness, and nipples per pt;  takes norvasc   Right lower quadrant pain    Scoliosis    thoracic   Sicca syndrome ([place])    Tremor, unspecified    intermittant left hand/ wrist tremor with dorsiflexion  ( neurology evaluation by dr tat note in epic 08-15-2019, felt to be clonus)    Home Medications Prior to Admission medications   Medication Sig Start Date End Date Taking? Authorizing Provider  albuterol (VENTOLIN HFA) 108 (90 Base) MCG/ACT inhaler albuterol sulfate HFA 90 mcg/actuation aerosol inhaler    [provider]  amLODipine (NORVASC) 5 MG tablet TAKE 1 TABLET BY MOUTH EVERYDAY AT BEDTIME 06/14/22   Ofilia Neas, PA-C  amphetamine-dextroamphetamine (ADDERALL) 10 MG tablet Take 10 mg by mouth 2 (two) times daily.    [provider]  aspirin EC 81 MG tablet Take 81 mg by mouth daily.    [provider]  buPROPion (WELLBUTRIN SR) 150 MG 12 hr tablet Take 150 mg by mouth daily.    [provider]  desvenlafaxine (PRISTIQ) 50 MG 24 hr tablet Take 50 mg by mouth daily. 03/26/20   [provider]  EMGALITY 120 MG/ML SOAJ Inject 1 mL into the skin every 30 (thirty)  days. 06/30/22   [provider]  gabapentin (NEURONTIN) 300 MG capsule Take 300 mg by mouth daily. 06/15/22   [provider]  hydroxychloroquine (PLAQUENIL) 200 MG tablet TAKE 1 TABLET BY MOUTH TWICE DAILY MONDAY THROUGH FRIDAY 09/26/22   Ofilia Neas, PA-C  ibuprofen (ADVIL) 800 MG tablet Take 1 tablet (800 mg total) by mouth every 8 (eight) hours as needed for cramping. 08/23/19   Servando Salina, MD  loratadine (CLARITIN) 10 MG tablet Take 10 mg by mouth daily.    [provider]  LORazepam (ATIVAN) 0.5 MG tablet as needed.    [provider]  Magnesium 250 MG TABS Take 250 mg by mouth daily.     [provider]  midodrine (PROAMATINE) 5 MG tablet Take 1 tablet (5 mg  total) by mouth 3 (three) times daily with meals. Patient taking differently: Take 5 mg by mouth as needed. 04/29/22   Tobb, Kardie, DO  Multiple Vitamins-Minerals (MULTIVITAMIN ADULT PO) Take 1 tablet by mouth daily.     [provider]  norethindrone-ethinyl estradiol (LOESTRIN) 1-20 MG-MCG tablet Take 1 tablet by mouth daily. 04/02/20   [provider]  ondansetron (ZOFRAN) 4 MG tablet as needed. 10/07/19   [provider]  Probiotic Product (PROBIOTIC PO) Take 1 capsule by mouth daily.     [provider]  propranolol (INDERAL) 10 MG tablet Take 1 tablet (10 mg total) by mouth every 8 (eight) hours as needed (if heart rate is over 150 bpm for over 15 minutes). 03/22/22   Tobb, Kardie, DO  Riboflavin 400 MG TABS Take 400 mg by mouth daily.     [provider]  SENNA PO Take by mouth daily.    [provider]  sertraline (ZOLOFT) 100 MG tablet Take 100 mg by mouth daily.    [provider]  Vitamin D, Ergocalciferol, (DRISDOL) 1.25 MG (50000 UNIT) CAPS capsule Take 1 capsule (50,000 Units total) by mouth every 7 (seven) days. 09/09/22   Ofilia Neas, PA-C      Allergies    Patient has no known allergies.    Review of Systems   Review of Systems  Constitutional:  Negative for fever.  Gastrointestinal:  Positive for abdominal pain. Negative for blood in stool, constipation, diarrhea, hematochezia and vomiting.  Genitourinary:  Negative for dysuria.    Physical Exam Updated Vital Signs BP 121/64 (BP Location: Right Arm)   Pulse 100   Temp 98.1 F (36.7 C) (Oral)   Resp 16   Ht 1.676 m ('5\' 6"'$ )   Wt 72 kg   SpO2 100%   BMI 25.62 kg/m  Physical Exam CONSTITUTIONAL: Well developed/well nourished, no distress HEAD: Normocephalic/atraumatic EYES: EOMI/PERRL ENMT: Mucous membranes moist NECK: supple no meningeal signs CV: S1/S2 noted, no murmurs/rubs/gallops noted LUNGS: Lungs are clear to auscultation bilaterally, no  apparent distress ABDOMEN: soft, mild diffuse tenderness, no rebound or guarding, bowel sounds noted throughout abdomen NEURO: Pt is awake/alert/appropriate, moves all extremitiesx4.  No facial droop.   SKIN: warm, color normal PSYCH: no abnormalities of mood noted, alert and oriented to situation  ED Results / Procedures / Treatments   Labs (all labs ordered are listed, but only abnormal results are displayed) Labs Reviewed  URINALYSIS, ROUTINE W REFLEX MICROSCOPIC - Abnormal; Notable for the following components:      Result Value   Color, Urine STRAW (*)    Specific Gravity, Urine 1.004 (*)    All other components within normal  limits  CBC WITH DIFFERENTIAL/PLATELET  COMPREHENSIVE METABOLIC PANEL  LIPASE, BLOOD  I-STAT BETA HCG BLOOD, ED ([facility], [facility], [facility] ONLY)    EKG None  Radiology CT ABDOMEN PELVIS W CONTRAST  Result Date: 10/17/2022 CLINICAL DATA:  RLQ abdominal pain RLQ and RUQ TTP some abd distention EXAM: CT ABDOMEN AND PELVIS WITH CONTRAST TECHNIQUE: Multidetector CT imaging of the abdomen and pelvis was performed using the standard protocol following bolus administration of intravenous contrast. RADIATION DOSE REDUCTION: This exam was performed according to the departmental dose-optimization program which includes automated exposure control, adjustment of the mA and/or kV according to patient size and/or use of iterative reconstruction technique. CONTRAST:  45m OMNIPAQUE IOHEXOL 350 MG/ML SOLN COMPARISON:  CT abdomen pelvis 12/13/2014 FINDINGS: Lower chest: No acute abnormality. Hepatobiliary: No focal liver abnormality. No gallstones, gallbladder wall thickening, or pericholecystic fluid. No biliary dilatation. Pancreas: No focal lesion. Normal pancreatic contour. No surrounding inflammatory changes. No main pancreatic ductal dilatation. Spleen: Normal in size without focal abnormality. Adrenals/Urinary Tract: No adrenal nodule bilaterally. Bilateral kidneys enhance symmetrically.  No hydronephrosis. No hydroureter. The urinary bladder is unremarkable. Stomach/Bowel: Stomach is within normal limits. No evidence of bowel wall thickening or dilatation. Stool throughout the ascending colon as well as within the transverse colon. Colonic diverticulosis. Appendix appears normal. Vascular/Lymphatic: No abdominal aorta or iliac aneurysm. No abdominal, pelvic, or inguinal lymphadenopathy. Reproductive: T-shaped intrauterine device in appropriate position. Uterus and bilateral adnexa are unremarkable. Other: No intraperitoneal free fluid. No intraperitoneal free gas. No organized fluid collection. Musculoskeletal: No abdominal wall hernia or abnormality. No suspicious lytic or blastic osseous lesions. No acute displaced fracture. IMPRESSION: 1. No acute intra-abdominal or  intrapelvic abnormality. 2. Colonic diverticulosis with no acute diverticulitis. 3. T-shaped intrauterine device in appropriate position. Electronically Signed   By: MIven FinnM.D.   On: 10/17/2022 00:17    Procedures Procedures    Medications Ordered in ED Medications  iohexol (OMNIPAQUE) 350 MG/ML injection 75 mL (75 mLs Intravenous Contrast Given 10/17/22 0000)  ondansetron (ZOFRAN) injection 4 mg (4 mg Intravenous Given 10/17/22 0143)    ED Course/ Medical Decision Making/ A&P Clinical Course as of 10/17/22 0229  Mon Oct 17, 2022  0229 Patient resting comfortably, no acute distress, using her phone.  Reports nausea is improved.  We discussed all findings.  I did offer pelvic ultrasound for further exploration of her abdominal pain.  Patient declines, will prefer to follow-up as an outpatient.  She is in no acute distress.  We discussed strict return precautions.  She is already known to a lDatabase administrator [DW]    Clinical Course User Index [DW] WRipley Fraise MD                           Medical Decision Making Risk Prescription drug management.   This patient presents to the ED for concern  of abdominal pain, this involves an extensive number of treatment options, and is a complaint that carries with it a high risk of complications and morbidity.  The differential diagnosis includes but is not limited to cholecystitis, cholelithiasis, pancreatitis, gastritis, peptic ulcer disease, appendicitis, bowel obstruction, bowel perforation, diverticulitis, AAA, ischemic bowel, ectopic pregnancy, TOA, PID    Comorbidities that complicate the patient evaluation: Patient's presentation is complicated by their history of anxiety, lupus   Additional history obtained: Records reviewed  outpatient records reviewed  Lab Tests: I Ordered, and personally interpreted labs.  The pertinent results include: Labs  overall unremarkable  Imaging Studies ordered: I ordered imaging studies including CT scan abdomen pelvis   I independently visualized and interpreted imaging which showed no acute findings I agree with the radiologist interpretation   Medicines ordered and prescription drug management: I ordered medication including Zofran for nausea Reevaluation of the patient after these medicines showed that the patient    improved   Reevaluation: After the interventions noted above, I reevaluated the patient and found that they have :improved  Complexity of problems addressed: Patient's presentation is most consistent with  acute presentation with potential threat to life or bodily function  Disposition: After consideration of the diagnostic results and the patient's response to treatment,  I feel that the patent would benefit from discharge   .           Final Clinical Impression(s) / ED Diagnoses Final diagnoses:  Generalized abdominal pain    Rx / DC Orders ED Discharge Orders     None         Ripley Fraise, MD 10/17/22 0230

## 2022-10-17 NOTE — Discharge Instructions (Signed)

## 2022-10-21 DIAGNOSIS — R1031 Right lower quadrant pain: Secondary | ICD-10-CM | POA: Diagnosis not present

## 2022-10-24 DIAGNOSIS — K580 Irritable bowel syndrome with diarrhea: Secondary | ICD-10-CM | POA: Diagnosis not present

## 2022-10-24 DIAGNOSIS — K5900 Constipation, unspecified: Secondary | ICD-10-CM | POA: Diagnosis not present

## 2022-10-24 DIAGNOSIS — R140 Abdominal distension (gaseous): Secondary | ICD-10-CM | POA: Diagnosis not present

## 2022-11-02 DIAGNOSIS — L738 Other specified follicular disorders: Secondary | ICD-10-CM | POA: Diagnosis not present

## 2022-11-02 DIAGNOSIS — Z85828 Personal history of other malignant neoplasm of skin: Secondary | ICD-10-CM | POA: Diagnosis not present

## 2022-11-02 DIAGNOSIS — D225 Melanocytic nevi of trunk: Secondary | ICD-10-CM | POA: Diagnosis not present

## 2022-11-29 NOTE — Progress Notes (Unsigned)
Office Visit Note  Patient: Sharon Hartman             Date of Birth: [birth year]           MRN: [id number]             PCP: Carol Ada, MD Referring: Carol Ada, MD Visit Date: 12/13/2022 Occupation: '@GUAROCC'$ @  Subjective:  Medication monitoring   History of Present Illness: Sharon Hartman is a 42 y.o. female with history of undifferentiated connective tissue disease.  Patient is taking plaquenil 200 mg 1 tablet by mouth twice daily Monday through Friday.  She is tolerating Plaquenil without any side effects and has not missed any doses recently.  She denies any signs or symptoms of a flare.  She continues to have occasional arthralgias and joint stiffness but denies any joint swelling.  Her energy level has been stable overall.  She has had less frequent facial rashes and has symptoms of Raynaud's phenomenon in her feet intermittently.  She denies any digital ulcerations.  She has not had any oral or nasal ulcerations.  She continues to have symptoms of dry mouth but denies any eye dryness recently.  She has noticed some increased hair loss. She has been under the care of Dr. Benson Norway for management of SIBO.  She is considering proceeding with a colonoscopy soon.   She has been taking vitamin D 5000 units once weekly.   Activities of Daily Living:  Patient reports morning stiffness for 15 minutes.   Patient Reports nocturnal pain.  Difficulty dressing/grooming: Denies Difficulty climbing stairs: Denies Difficulty getting out of chair: Denies Difficulty using hands for taps, buttons, cutlery, and/or writing: Denies  Review of Systems  Constitutional:  Positive for fatigue.  HENT:  Positive for mouth dryness. Negative for mouth sores.   Eyes: Negative.  Negative for dryness.  Respiratory: Negative.  Negative for shortness of breath.   Cardiovascular:  Positive for palpitations. Negative for chest pain.  Gastrointestinal:  Positive for blood in stool, constipation and diarrhea.   Endocrine: Negative.  Negative for increased urination.  Genitourinary: Negative.  Negative for involuntary urination.  Musculoskeletal:  Positive for joint pain, joint pain and morning stiffness. Negative for gait problem, joint swelling, myalgias, muscle weakness, muscle tenderness and myalgias.  Skin:  Positive for hair loss. Negative for color change, rash and sensitivity to sunlight.  Allergic/Immunologic: Negative.  Negative for susceptible to infections.  Neurological:  Positive for dizziness and headaches.  Hematological: Negative.  Negative for swollen glands.  Psychiatric/Behavioral:  Positive for depressed mood. Negative for sleep disturbance. The patient is nervous/anxious.     PMFS History:  Patient Active Problem List   Diagnosis Date Noted   Tremor 09/12/2019   Chronic migraine 09/12/2019   Postpartum care following vaginal delivery (9/9) 06/18/2014   Post-dates pregnancy 06/17/2014   Palpitations 11/22/2013   Syncope 11/22/2013   Orthostatic hypotension 11/22/2013   ADHD (attention deficit hyperactivity disorder) 12/04/2011    Past Medical History:  Diagnosis Date   ADD (attention deficit disorder)    Anxiety    Constipation    DDD (degenerative disc disease), lumbar    Depression    Fatty liver    Galactorrhea    bilateral nipple discharge   History of basal cell carcinoma (BCC) excision    back 2017 excision   History of cervical dysplasia    CIN II  s/p LEEP 06/ 2005   History of suicide attempt    2003; 2006  History of syncope    02/ 2015  w/ palpitations and pregnant--- evaluation by cardiology, dr Debara Pickett, note in epic 03/ 2015, work-up done w/ normal echo and event monitor showed SR with isolated PVCs/ PACs   Hyperlipidemia    Inappropriate sinus tachycardia    Low testosterone    Low vitamin D level    Lupus ([place])    Migraine    Numbness    bilateral hands and feet due to raynaurd's disease   Polyarthralgia    PONV (postoperative nausea and  vomiting)    also lightweight when it comes to anesthesia   Raynaud's disease    rheumotology--- dr Estanislado Pandy--  with bilateral hand and feet numbness, and nipples per pt;  takes norvasc   Right lower quadrant pain    Scoliosis    thoracic   Sicca syndrome ([facility])    Tremor, unspecified    intermittant left hand/ wrist tremor with dorsiflexion  ( neurology evaluation by dr tat note in epic 08-15-2019, felt to be clonus)    Family History  Problem Relation Age of Onset   Heart murmur Mother    Diabetes Mother    Hepatitis Mother        auto immune    Breast cancer Paternal Grandmother    Hypertension Paternal Grandmother    Hyperlipidemia Paternal Grandmother    Heart disease Paternal Grandmother    Hypertension Paternal Grandfather    Heart disease Paternal Grandfather        CABG   Heart attack Paternal Grandfather        X4   Hyperlipidemia Paternal Grandfather    Hypertension Father    Hyperlipidemia Father    Hashimoto's thyroiditis Sister    Depression Sister    Breast cancer Sister    Anxiety disorder Sister    Scoliosis Brother    Drug abuse Brother    Autism Son    Past Surgical History:  Procedure Laterality Date   DILATATION & CURETTAGE/HYSTEROSCOPY WITH MYOSURE N/A 08/23/2019   Procedure: DILATATION & CURETTAGE/HYSTEROSCOPY WITH MYOSURE;  Surgeon: Servando Salina, MD;  Location: [place];  Service: Gynecology;  Laterality: N/A;   DILATION AND CURETTAGE OF UTERUS  1999   GANGLION CYST EXCISION Right 2011   foot   LAPAROSCOPIC LYSIS OF ADHESIONS N/A 12/23/2019   Procedure: DIAGNOSTIC LAPAROSCOPY WITH PERITONEAL BIOPSY;  Surgeon: Servando Salina, MD;  Location: [place];  Service: Gynecology;  Laterality: N/A;  2 1/2 hrs.   LEEP  03-31-2004   '@[facility]'$    CIN II   TONSILLECTOMY  1998   WISDOM TOOTH EXTRACTION  1999   Social History   Social History Narrative   Lives at home with husband and son.   Right-handed.   No  caffeine use.   Immunization History  Administered Date(s) Administered   PFIZER(Purple Top)SARS-COV-2 Vaccination 10/22/2019, 11/12/2019, 05/28/2020   PPD Test 02/13/2012, 11/14/2017, 12/25/2017   Tdap 02/13/2012     Objective: Vital Signs: BP 104/72 (BP Location: Left Arm, Patient Position: Sitting, Cuff Size: Normal)   Pulse (!) 102   Resp 16   Ht '5\' 6"'$  (1.676 m)   Wt 154 lb (69.9 kg)   BMI 24.86 kg/m    Physical Exam Vitals and nursing note reviewed.  Constitutional:      Appearance: She is well-developed.  HENT:     Head: Normocephalic and atraumatic.  Eyes:     Conjunctiva/sclera: Conjunctivae normal.  Cardiovascular:     Rate and  Rhythm: Normal rate and regular rhythm.     Heart sounds: Normal heart sounds.  Pulmonary:     Effort: Pulmonary effort is normal.     Breath sounds: Normal breath sounds.  Abdominal:     General: Bowel sounds are normal.     Palpations: Abdomen is soft.  Musculoskeletal:     Cervical back: Normal range of motion.  Lymphadenopathy:     Cervical: No cervical adenopathy.  Skin:    General: Skin is warm and dry.     Capillary Refill: Capillary refill takes less than 2 seconds.  Neurological:     Mental Status: She is alert and oriented to person, place, and time.  Psychiatric:        Behavior: Behavior normal.      Musculoskeletal Exam: C-spine has good ROM with no discomfort. Midline spinal tenderness in the lumbar region.  Tenderness over both SI joints.  Shoulder joints, elbow joints, wrist joints, MCPs, PIPs, and DIPs good ROM with no synovitis. Complete fist formation bilaterally.  Hip joints, knee joints, ankle joints have good ROM with no discomfort.  No warmth or effusion of knee joints.  No tenderness or swelling of ankle joints.   CDAI Exam: CDAI Score: -- Patient Global: --; Provider Global: -- Swollen: --; Tender: -- Joint Exam 12/13/2022   No joint exam has been documented for this visit   There is currently no  information documented on the homunculus. Go to the Rheumatology activity and complete the homunculus joint exam.  Investigation: No additional findings.  Imaging: No results found.  Recent Labs: Lab Results  Component Value Date   WBC 7.5 10/16/2022   HGB 14.2 10/16/2022   PLT 349 10/16/2022   NA 136 10/16/2022   K 3.6 10/16/2022   CL 102 10/16/2022   CO2 23 10/16/2022   GLUCOSE 90 10/16/2022   BUN 6 10/16/2022   CREATININE 0.70 10/16/2022   BILITOT 0.5 10/16/2022   ALKPHOS 39 10/16/2022   AST 23 10/16/2022   ALT 21 10/16/2022   PROT 6.7 10/16/2022   ALBUMIN 4.4 10/16/2022   CALCIUM 9.7 10/16/2022   GFRAA 127 12/22/2020    Speciality Comments: PLQ Eye Exam 05/26/2022 WNL @ [facility] Follow up in 1 year  Procedures:  No procedures performed Allergies: Patient has no known allergies.     Assessment / Plan:     Visit Diagnoses: Undifferentiated connective tissue disease ([place]) - - Lupus anticoagulant detected on 04/27/2020, later negative.  AVISE index negative: -2.8.  Anti-C1q IgG is positive, rest of panel negative on 05/15/20: She has not had any signs or symptoms of a flare.  She has clinically been doing well taking Plaquenil 200 mg 1 tablet by mouth twice daily Monday through Friday. Lab work from 09/08/22 was reviewed today in the office: ANA negative, dsDNA negative, complements WNL, ESR WNL, and no proteinuria.  Her labs were not consistent with active disease or a flare.  She continues to have intermittent fatigue, dry mouth, and arthralgias.  Overall her symptoms have been manageable and stable.  No medication changes will be made at this time.  The following lab work will be obtained today for further evaluation.  She was advised to notify us if she develops signs or symptoms of a flare.  She will follow-up in the office in 5 months or sooner if needed. - Plan: CBC with Differential/Platelet, Protein / creatinine ratio, urine, COMPLETE METABOLIC PANEL WITH  GFR, Anti-DNA antibody, double-stranded, C3 and C4,  Sedimentation rate  High risk medication use - Plaquenil 200 mg 1 tablet by mouth twice daily M-F only (Started at the end of July 2021).  CBC and CMP updated on 10/16/22.  Orders for CBC and CMP were released today. PLQ Eye Exam 05/26/2022 WNL @ [facility] Follow up in 1 year   - Plan: CBC with Differential/Platelet, COMPLETE METABOLIC PANEL WITH GFR  Raynaud's disease without gangrene - She remains on amlodipine 5 mg 1 tablet by mouth daily.  She has had infrequent symptoms of Raynaud's phenomenon in her feet.  No ulcerations.  Blood pressure has remained well-controlled taking amlodipine as prescribed.  Sicca syndrome [facility]): She continues to have symptoms of chronic dry mouth.  Discussed the use of a humidifier as well as over the counter products including Biotene oral spray and XyliMelts.  I also discussed the importance of taking small sips of water frequently throughout the day.  No cervical lymphadenopathy was palpable today.    Other fatigue: Stable.  Vitamin D deficiency: Vitamin D was 25 on 09/08/22.  She has been taking vitamin D 5000 units once weekly.  Encourage patient to increase vitamin D to 5000 units twice weekly.   Trochanteric bursitis of both hips: She has ongoing tenderness over bilateral trochanteric bursa.  No groin pain currently.  Other idiopathic scoliosis, thoracic region  DDD (degenerative disc disease), lumbar: She continues to experience intermittent discomfort and stiffness in her lower back.  She has some midline spinal tenderness in the lumbar region and tenderness over both SI joints.  No symptoms of radiculopathy.   Other medical conditions are listed as follows:   Orthostatic hypotension  Anxiety and depression  History of ADHD  Essential tremor  Fatty liver  Orders: Orders Placed This Encounter  Procedures   CBC with Differential/Platelet   Protein / creatinine ratio, urine    COMPLETE METABOLIC PANEL WITH GFR   Anti-DNA antibody, double-stranded   C3 and C4   Sedimentation rate   No orders of the defined types were placed in this encounter.    Follow-Up Instructions: Return in about 5 months (around 05/15/2023) for Undifferentiated connective tissue disease.   Ofilia Neas, PA-C  Note - This record has been created using Dragon software.  Chart creation errors have been sought, but may not always  have been located. Such creation errors do not reflect on  the standard of medical care.

## 2022-12-02 ENCOUNTER — Other Ambulatory Visit: Payer: Self-pay | Admitting: Physician Assistant

## 2022-12-02 NOTE — Telephone Encounter (Signed)
Next Visit: 12/13/2022  Last Visit: 06/09/2022  Labs: 10/16/2022 WNL  Eye exam: 05/26/2022 WNL    Current Dose per office note 06/09/2022: Plaquenil 200 mg 1 tablet by mouth twice daily M-F only    DX: Undifferentiated connective tissue disease    Last Fill: 09/26/2022  Okay to refill Plaquenil?

## 2022-12-05 DIAGNOSIS — R140 Abdominal distension (gaseous): Secondary | ICD-10-CM | POA: Diagnosis not present

## 2022-12-05 DIAGNOSIS — K5900 Constipation, unspecified: Secondary | ICD-10-CM | POA: Diagnosis not present

## 2022-12-09 ENCOUNTER — Ambulatory Visit: Payer: Managed Care, Other (non HMO) | Admitting: Physician Assistant

## 2022-12-13 ENCOUNTER — Ambulatory Visit: Payer: BC Managed Care – PPO | Attending: Physician Assistant | Admitting: Physician Assistant

## 2022-12-13 ENCOUNTER — Encounter: Payer: Self-pay | Admitting: Physician Assistant

## 2022-12-13 VITALS — BP 104/72 | HR 102 | Resp 16 | Ht 66.0 in | Wt 154.0 lb

## 2022-12-13 DIAGNOSIS — G250 Essential tremor: Secondary | ICD-10-CM

## 2022-12-13 DIAGNOSIS — R5383 Other fatigue: Secondary | ICD-10-CM

## 2022-12-13 DIAGNOSIS — I7300 Raynaud's syndrome without gangrene: Secondary | ICD-10-CM

## 2022-12-13 DIAGNOSIS — F419 Anxiety disorder, unspecified: Secondary | ICD-10-CM

## 2022-12-13 DIAGNOSIS — M7062 Trochanteric bursitis, left hip: Secondary | ICD-10-CM

## 2022-12-13 DIAGNOSIS — I951 Orthostatic hypotension: Secondary | ICD-10-CM

## 2022-12-13 DIAGNOSIS — Z79899 Other long term (current) drug therapy: Secondary | ICD-10-CM | POA: Diagnosis not present

## 2022-12-13 DIAGNOSIS — F32A Depression, unspecified: Secondary | ICD-10-CM

## 2022-12-13 DIAGNOSIS — M5136 Other intervertebral disc degeneration, lumbar region: Secondary | ICD-10-CM

## 2022-12-13 DIAGNOSIS — M359 Systemic involvement of connective tissue, unspecified: Secondary | ICD-10-CM | POA: Diagnosis not present

## 2022-12-13 DIAGNOSIS — M7061 Trochanteric bursitis, right hip: Secondary | ICD-10-CM

## 2022-12-13 DIAGNOSIS — E559 Vitamin D deficiency, unspecified: Secondary | ICD-10-CM

## 2022-12-13 DIAGNOSIS — M3500 Sicca syndrome, unspecified: Secondary | ICD-10-CM | POA: Diagnosis not present

## 2022-12-13 DIAGNOSIS — K760 Fatty (change of) liver, not elsewhere classified: Secondary | ICD-10-CM

## 2022-12-13 DIAGNOSIS — M4124 Other idiopathic scoliosis, thoracic region: Secondary | ICD-10-CM

## 2022-12-13 DIAGNOSIS — Z8659 Personal history of other mental and behavioral disorders: Secondary | ICD-10-CM

## 2022-12-15 LAB — COMPLETE METABOLIC PANEL WITH GFR
AG Ratio: 2 (calc) (ref 1.0–2.5)
ALT: 11 U/L (ref 6–29)
AST: 15 U/L (ref 10–30)
Albumin: 4.3 g/dL (ref 3.6–5.1)
Alkaline phosphatase (APISO): 33 U/L (ref 31–125)
BUN/Creatinine Ratio: 9 (calc) (ref 6–22)
BUN: 6 mg/dL — ABNORMAL LOW (ref 7–25)
CO2: 26 mmol/L (ref 20–32)
Calcium: 9.2 mg/dL (ref 8.6–10.2)
Chloride: 105 mmol/L (ref 98–110)
Creat: 0.7 mg/dL (ref 0.50–0.99)
Globulin: 2.1 g/dL (calc) (ref 1.9–3.7)
Glucose, Bld: 88 mg/dL (ref 65–99)
Potassium: 3.2 mmol/L — ABNORMAL LOW (ref 3.5–5.3)
Sodium: 139 mmol/L (ref 135–146)
Total Bilirubin: 0.4 mg/dL (ref 0.2–1.2)
Total Protein: 6.4 g/dL (ref 6.1–8.1)
eGFR: 111 mL/min/{1.73_m2} (ref >=60)

## 2022-12-15 LAB — CBC WITH DIFFERENTIAL/PLATELET
Absolute Monocytes: 315 cells/uL (ref 200–950)
Basophils Absolute: 54 cells/uL (ref 0–200)
Basophils Relative: 0.8 %
Eosinophils Absolute: 60 cells/uL (ref 15–500)
Eosinophils Relative: 0.9 %
HCT: 40.8 % (ref 35.0–45.0)
Hemoglobin: 13.7 g/dL (ref 11.7–15.5)
Lymphs Abs: 2432 cells/uL (ref 850–3900)
MCH: 31.3 pg (ref 27.0–33.0)
MCHC: 33.6 g/dL (ref 32.0–36.0)
MCV: 93.2 fL (ref 80.0–100.0)
MPV: 9.8 fL (ref 7.5–12.5)
Monocytes Relative: 4.7 %
Neutro Abs: 3839 cells/uL (ref 1500–7800)
Neutrophils Relative %: 57.3 %
Platelets: 301 10*3/uL (ref 140–400)
RBC: 4.38 10*6/uL (ref 3.80–5.10)
RDW: 11.9 % (ref 11.0–15.0)
Total Lymphocyte: 36.3 %
WBC: 6.7 10*3/uL (ref 3.8–10.8)

## 2022-12-15 LAB — ANTI-DNA ANTIBODY, DOUBLE-STRANDED: ds DNA Ab: <1 IU/mL

## 2022-12-15 LAB — PROTEIN / CREATININE RATIO, URINE
Creatinine, Urine: 36 mg/dL (ref 20–275)
Total Protein, Urine: <4 mg/dL — ABNORMAL LOW (ref 5–24)

## 2022-12-15 LAB — SEDIMENTATION RATE: Sed Rate: 2 mm/h (ref 0–20)

## 2022-12-15 LAB — C3 AND C4
C3 Complement: 82 mg/dL — ABNORMAL LOW (ref 83–193)
C4 Complement: 24 mg/dL (ref 15–57)

## 2022-12-15 NOTE — Progress Notes (Signed)
Urine protein creatinine ratio normal, potassium is low at 3.2, complement C3 is low CBC normal, sed rate normal, double-stranded DNA negative.  Labs do not indicate autoimmune disease flare.  Potassium is low.  Please notify patient and forward results to her PCP.  She should contact her PCP for the treatment of potassium deficiency.

## 2022-12-26 DIAGNOSIS — Z01419 Encounter for gynecological examination (general) (routine) without abnormal findings: Secondary | ICD-10-CM | POA: Diagnosis not present

## 2022-12-26 DIAGNOSIS — Z1231 Encounter for screening mammogram for malignant neoplasm of breast: Secondary | ICD-10-CM | POA: Diagnosis not present

## 2022-12-26 DIAGNOSIS — Z124 Encounter for screening for malignant neoplasm of cervix: Secondary | ICD-10-CM | POA: Diagnosis not present

## 2023-01-16 DIAGNOSIS — K580 Irritable bowel syndrome with diarrhea: Secondary | ICD-10-CM | POA: Diagnosis not present

## 2023-01-16 DIAGNOSIS — F900 Attention-deficit hyperactivity disorder, predominantly inattentive type: Secondary | ICD-10-CM | POA: Diagnosis not present

## 2023-01-16 DIAGNOSIS — F322 Major depressive disorder, single episode, severe without psychotic features: Secondary | ICD-10-CM | POA: Diagnosis not present

## 2023-01-16 DIAGNOSIS — K921 Melena: Secondary | ICD-10-CM | POA: Diagnosis not present

## 2023-01-16 DIAGNOSIS — F419 Anxiety disorder, unspecified: Secondary | ICD-10-CM | POA: Diagnosis not present

## 2023-01-16 DIAGNOSIS — G43909 Migraine, unspecified, not intractable, without status migrainosus: Secondary | ICD-10-CM | POA: Diagnosis not present

## 2023-01-16 DIAGNOSIS — R159 Full incontinence of feces: Secondary | ICD-10-CM | POA: Diagnosis not present

## 2023-01-24 DIAGNOSIS — K6389 Other specified diseases of intestine: Secondary | ICD-10-CM | POA: Diagnosis not present

## 2023-01-24 DIAGNOSIS — K5730 Diverticulosis of large intestine without perforation or abscess without bleeding: Secondary | ICD-10-CM | POA: Diagnosis not present

## 2023-01-24 DIAGNOSIS — R194 Change in bowel habit: Secondary | ICD-10-CM | POA: Diagnosis not present

## 2023-03-29 ENCOUNTER — Other Ambulatory Visit: Payer: Self-pay | Admitting: *Deleted

## 2023-03-29 MED ORDER — HYDROXYCHLOROQUINE SULFATE 200 MG PO TABS: ORAL_TABLET | ORAL | 0 refills | Status: DC

## 2023-03-29 NOTE — Telephone Encounter (Signed)
Refill request received via fax from [facility]. for PLQ   Last Fill: 12/02/2022  Eye exam: 05/26/2022 WNL   Labs: 12/13/2022 Urine protein creatinine ratio normal, potassium is low at 3.2, complement C3 is low CBC normal, sed rate normal, double-stranded DNA negative.  Labs do not indicate autoimmune disease flare.  Potassium is low.   Next Visit: 05/17/2023  Last Visit: 12/13/2022  [id number] connective tissue disease   Current Dose per office note 12/13/2022: Plaquenil 200 mg 1 tablet by mouth twice daily M-F only   Okay to refill Plaquenil?

## 2023-05-04 NOTE — Progress Notes (Signed)
Office Visit Note  Patient: Sharon Hartman             Date of Birth: [birth year]           MRN: [id number]             PCP: Merri Brunette, MD Referring: Merri Brunette, MD Visit Date: 05/17/2023 Occupation: @GUAROCC @  Subjective:  Fatigue and joint pain  History of Present Illness: Sharon Hartman is a 43 y.o. female with undifferentiated connective tissue disease.  She states she has been having increased fatigue, occasional nasal ulcers, dry mouth, dry eyes, arthralgias, hair loss and intermittent rash.  She states the rash is mostly on her knuckles on her face.  She continues to have Raynaud's phenomenon in her feet.  She has been taking hydroxychloroquine 200 mg 1 tablet by mouth twice a day Monday through Friday without any interruption.    Activities of Daily Living:  Patient reports morning stiffness for less than 30 minutes.   Patient Reports nocturnal pain.  Difficulty dressing/grooming: Denies Difficulty climbing stairs: Denies Difficulty getting out of chair: Denies Difficulty using hands for taps, buttons, cutlery, and/or writing: Denies  Review of Systems  Constitutional:  Positive for fatigue.  HENT:  Positive for mouth dryness. Negative for mouth sores.        Nose sores  Eyes:  Positive for dryness.  Respiratory:  Negative for shortness of breath.   Cardiovascular:  Negative for chest pain and palpitations.  Gastrointestinal:  Negative for blood in stool, constipation and diarrhea.  Endocrine: Negative for increased urination.  Genitourinary:  Negative for involuntary urination.  Musculoskeletal:  Positive for joint pain, joint pain, myalgias, morning stiffness, muscle tenderness and myalgias. Negative for gait problem, joint swelling and muscle weakness.  Skin:  Positive for color change, rash and hair loss. Negative for sensitivity to sunlight.  Allergic/Immunologic: Negative for susceptible to infections.  Neurological:  Positive for numbness. Negative for  dizziness and headaches.  Hematological:  Negative for swollen glands.  Psychiatric/Behavioral:  Negative for depressed mood and sleep disturbance. The patient is not nervous/anxious.     PMFS History:  Patient Active Problem List   Diagnosis Date Noted   Tremor 09/12/2019   Chronic migraine 09/12/2019   Postpartum care following vaginal delivery (9/9) 06/18/2014   Post-dates pregnancy 06/17/2014   Palpitations 11/22/2013   Syncope 11/22/2013   Orthostatic hypotension 11/22/2013   ADHD (attention deficit hyperactivity disorder) 12/04/2011    Past Medical History:  Diagnosis Date   ADD (attention deficit disorder)    Anxiety    Constipation    DDD (degenerative disc disease), lumbar    Depression    Fatty liver    Galactorrhea    bilateral nipple discharge   History of basal cell carcinoma (BCC) excision    back 2017 excision   History of cervical dysplasia    CIN II  s/p LEEP 06/ 2005   History of suicide attempt    2003; 2006   History of syncope    02/ 2015  w/ palpitations and pregnant--- evaluation by cardiology, dr Rennis Golden, note in epic 03/ 2015, work-up done w/ normal echo and event monitor showed SR with isolated PVCs/ PACs   Hyperlipidemia    Inappropriate sinus tachycardia    Low testosterone    Low vitamin D level    Lupus (HCC)    Migraine    Numbness    bilateral hands and feet due to raynaurd's disease   Polyarthralgia  PONV (postoperative nausea and vomiting)    also lightweight when it comes to anesthesia   Raynaud's disease    rheumotology--- dr Corliss Skains--  with bilateral hand and feet numbness, and nipples per pt;  takes norvasc   Right lower quadrant pain    Scoliosis    thoracic   Sicca syndrome (HCC)    Tremor, unspecified    intermittant left hand/ wrist tremor with dorsiflexion  ( neurology evaluation by dr tat note in epic 08-15-2019, felt to be clonus)    Family History  Problem Relation Age of Onset   Heart murmur Mother    Diabetes  Mother    Hepatitis Mother        auto immune    Breast cancer Paternal Grandmother    Hypertension Paternal Grandmother    Hyperlipidemia Paternal Grandmother    Heart disease Paternal Grandmother    Hypertension Paternal Grandfather    Heart disease Paternal Grandfather        CABG   Heart attack Paternal Grandfather        X4   Hyperlipidemia Paternal Grandfather    Hypertension Father    Hyperlipidemia Father    Hashimoto's thyroiditis Sister    Depression Sister    Breast cancer Sister    Anxiety disorder Sister    Scoliosis Brother    Drug abuse Brother    Autism Son    Past Surgical History:  Procedure Laterality Date   DILATATION & CURETTAGE/HYSTEROSCOPY WITH MYOSURE N/A 08/23/2019   Procedure: DILATATION & CURETTAGE/HYSTEROSCOPY WITH MYOSURE;  Surgeon: Maxie Better, MD;  Location: [facility];  Service: Gynecology;  Laterality: N/A;   DILATION AND CURETTAGE OF UTERUS  1999   GANGLION CYST EXCISION Right 2011   foot   LAPAROSCOPIC LYSIS OF ADHESIONS N/A 12/23/2019   Procedure: DIAGNOSTIC LAPAROSCOPY WITH PERITONEAL BIOPSY;  Surgeon: Maxie Better, MD;  Location: [facility] [place];  Service: Gynecology;  Laterality: N/A;  2 1/2 hrs.   LEEP  03-31-2004   @[facility]    CIN II   TONSILLECTOMY  1998   WISDOM TOOTH EXTRACTION  1999   Social History   Social History Narrative   Lives at home with husband and son.   Right-handed.   No caffeine use.   Immunization History  Administered Date(s) Administered   PFIZER(Purple Top)SARS-COV-2 Vaccination 10/22/2019, 11/12/2019, 05/28/2020   PPD Test 02/13/2012, 11/14/2017, 12/25/2017   Tdap 02/13/2012     Objective: Vital Signs: BP 117/83 (BP Location: Left Arm, Patient Position: Sitting, Cuff Size: Normal)   Pulse (!) 103   Resp 16   Ht 5\' 6"  (1.676 m)   Wt 133 lb 3.2 oz (60.4 kg)   BMI 21.50 kg/m    Physical Exam Vitals and nursing note reviewed.  Constitutional:      Appearance:  She is well-developed.  HENT:     Head: Normocephalic and atraumatic.  Eyes:     Conjunctiva/sclera: Conjunctivae normal.  Cardiovascular:     Rate and Rhythm: Normal rate and regular rhythm.     Heart sounds: Normal heart sounds.  Pulmonary:     Effort: Pulmonary effort is normal.     Breath sounds: Normal breath sounds.  Abdominal:     General: Bowel sounds are normal.     Palpations: Abdomen is soft.  Musculoskeletal:     Cervical back: Normal range of motion.  Lymphadenopathy:     Cervical: No cervical adenopathy.  Skin:    General: Skin is warm and  dry.     Capillary Refill: Capillary refill takes less than 2 seconds.  Neurological:     Mental Status: She is alert and oriented to person, place, and time.  Psychiatric:        Behavior: Behavior normal.      Musculoskeletal Exam: Cervical, thoracic and lumbar spine 1 good range of motion.  She has no difficulty reaching her toes.  Shoulders, elbow joints, wrist joints, MCPs PIPs and DIPs with good range of motion with no synovitis.  Hip joints, knee joints, ankles, MTPs and PIPs with good range of motion with no synovitis.  CDAI Exam: CDAI Score: -- Patient Global: --; Provider Global: -- Swollen: --; Tender: -- Joint Exam 05/17/2023   No joint exam has been documented for this visit   There is currently no information documented on the homunculus. Go to the Rheumatology activity and complete the homunculus joint exam.  Investigation: No additional findings.  Imaging: No results found.  Recent Labs: Lab Results  Component Value Date   WBC 6.7 12/13/2022   HGB 13.7 12/13/2022   PLT 301 12/13/2022   NA 139 12/13/2022   K 3.2 (L) 12/13/2022   CL 105 12/13/2022   CO2 26 12/13/2022   GLUCOSE 88 12/13/2022   BUN 6 (L) 12/13/2022   CREATININE 0.70 12/13/2022   BILITOT 0.4 12/13/2022   ALKPHOS 39 10/16/2022   AST 15 12/13/2022   ALT 11 12/13/2022   PROT 6.4 12/13/2022   ALBUMIN 4.4 10/16/2022   CALCIUM 9.2  12/13/2022   GFRAA 127 12/22/2020    Speciality Comments: PLQ Eye Exam 05/26/2022 WNL @ [facility] Follow up in 1 year  Procedures:  No procedures performed Allergies: Patient has no known allergies.   Assessment / Plan:     Visit Diagnoses: Undifferentiated connective tissue disease (HCC) - Lupus anticoagulant detected on 04/27/2020, later negative.  AVISE index negative: -2.8.  Anti-C1q IgG is positive, rest of panel negative on 05/15/20: Patient gives history of fatigue, arthralgias, nasal sores, rash, Raynaud's, hair loss and sicca symptoms.  She continues to have ongoing symptoms.  She has been taking Plaquenil 200 mg p.o. twice daily without any interruption.  Labs obtained on December 13, 2022 double-stranded DNA was negative, sed rate normal, C3 was mildly decreased.  Will check autoimmune labs today.  High risk medication use - Plaquenil 200 mg 1 tablet by mouth twice daily M-F only (Started at the end of July 2021). PLQ Eye Exam 05/26/2022.  Will get labs today.  Informational immunization was placed in the AVS.  Raynaud's disease without gangrene -she continues to have some Raynaud's in her feet.  She remains on amlodipine 5 mg 1 tablet by mouth daily.  Keeping core temperature warm and warm for thing was discussed.  Sicca syndrome (HCC)-she complains of dry mouth and dry eyes.  Over-the-counter products were discussed.  Other fatigue-she has had chronic fatigue.  Vitamin D deficiency-she history of vitamin D deficiency.  Vitamin D was 25 on September 08, 2022.  Will check vitamin D level again today.  Trochanteric bursitis of both hips-she has intermittent pain.  Other idiopathic scoliosis, thoracic region  DDD (degenerative disc disease), lumbar-history of intermittent discomfort.  She states she has had increased discomfort recently.  A handout on exercises was given.  Orthostatic hypotension-blood pressure was normal at 117/83 today.  Other medical problems listed as  follows:  History of ADHD  Anxiety and depression  Fatty liver  Essential tremor  Orders: Orders Placed  This Encounter  Procedures   Protein / creatinine ratio, urine   CBC with Differential/Platelet   COMPLETE METABOLIC PANEL WITH GFR   Anti-DNA antibody, double-stranded   C3 and C4   Sedimentation rate   VITAMIN D 25 Hydroxy (Vit-D Deficiency, Fractures)   No orders of the defined types were placed in this encounter.    Follow-Up Instructions: Return in about 5 months (around 10/17/2023) for UCTD.   Pollyann Savoy, MD  Note - This record has been created using Animal nutritionist.  Chart creation errors have been sought, but may not always  have been located. Such creation errors do not reflect on  the standard of medical care.

## 2023-05-17 ENCOUNTER — Ambulatory Visit: Payer: BC Managed Care – PPO | Attending: Rheumatology | Admitting: Rheumatology

## 2023-05-17 ENCOUNTER — Encounter: Payer: Self-pay | Admitting: Rheumatology

## 2023-05-17 VITALS — BP 117/83 | HR 103 | Resp 16 | Ht 66.0 in | Wt 133.2 lb

## 2023-05-17 DIAGNOSIS — I7300 Raynaud's syndrome without gangrene: Secondary | ICD-10-CM

## 2023-05-17 DIAGNOSIS — M359 Systemic involvement of connective tissue, unspecified: Secondary | ICD-10-CM | POA: Diagnosis not present

## 2023-05-17 DIAGNOSIS — M3500 Sicca syndrome, unspecified: Secondary | ICD-10-CM | POA: Diagnosis not present

## 2023-05-17 DIAGNOSIS — M7061 Trochanteric bursitis, right hip: Secondary | ICD-10-CM

## 2023-05-17 DIAGNOSIS — Z79899 Other long term (current) drug therapy: Secondary | ICD-10-CM

## 2023-05-17 DIAGNOSIS — M51369 Other intervertebral disc degeneration, lumbar region without mention of lumbar back pain or lower extremity pain: Secondary | ICD-10-CM

## 2023-05-17 DIAGNOSIS — F419 Anxiety disorder, unspecified: Secondary | ICD-10-CM

## 2023-05-17 DIAGNOSIS — G250 Essential tremor: Secondary | ICD-10-CM

## 2023-05-17 DIAGNOSIS — R5383 Other fatigue: Secondary | ICD-10-CM

## 2023-05-17 DIAGNOSIS — M5136 Other intervertebral disc degeneration, lumbar region: Secondary | ICD-10-CM

## 2023-05-17 DIAGNOSIS — Z8659 Personal history of other mental and behavioral disorders: Secondary | ICD-10-CM

## 2023-05-17 DIAGNOSIS — M4124 Other idiopathic scoliosis, thoracic region: Secondary | ICD-10-CM

## 2023-05-17 DIAGNOSIS — I951 Orthostatic hypotension: Secondary | ICD-10-CM

## 2023-05-17 DIAGNOSIS — E559 Vitamin D deficiency, unspecified: Secondary | ICD-10-CM | POA: Diagnosis not present

## 2023-05-17 DIAGNOSIS — F32A Depression, unspecified: Secondary | ICD-10-CM

## 2023-05-17 DIAGNOSIS — M7062 Trochanteric bursitis, left hip: Secondary | ICD-10-CM

## 2023-05-17 DIAGNOSIS — K760 Fatty (change of) liver, not elsewhere classified: Secondary | ICD-10-CM

## 2023-05-17 LAB — CBC WITH DIFFERENTIAL/PLATELET
Absolute Monocytes: 416 cells/uL (ref 200–950)
Basophils Absolute: 40 cells/uL (ref 0–200)
Basophils Relative: 0.5 %
Eosinophils Absolute: 16 cells/uL (ref 15–500)
Eosinophils Relative: 0.2 %
HCT: 45.1 % — ABNORMAL HIGH (ref 35.0–45.0)
Hemoglobin: 15.3 g/dL (ref 11.7–15.5)
Lymphs Abs: 2472 cells/uL (ref 850–3900)
MCH: 32.1 pg (ref 27.0–33.0)
MCHC: 33.9 g/dL (ref 32.0–36.0)
MCV: 94.7 fL (ref 80.0–100.0)
MPV: 9.5 fL (ref 7.5–12.5)
Monocytes Relative: 5.2 %
Neutro Abs: 5056 cells/uL (ref 1500–7800)
Neutrophils Relative %: 63.2 %
Platelets: 372 10*3/uL (ref 140–400)
RBC: 4.76 10*6/uL (ref 3.80–5.10)
RDW: 12.5 % (ref 11.0–15.0)
Total Lymphocyte: 30.9 %
WBC: 8 10*3/uL (ref 3.8–10.8)

## 2023-05-17 LAB — SEDIMENTATION RATE: Sed Rate: 2 mm/h (ref 0–20)

## 2023-05-17 NOTE — Patient Instructions (Addendum)
Vaccines You are taking a medication(s) that can suppress your immune system.  The following immunizations are recommended: Flu annually Covid-19  RSV Td/Tdap (tetanus, diphtheria, pertussis) every 10 years Pneumonia (Prevnar 15 then Pneumovax 23 at least 1 year apart.  Alternatively, can take Prevnar 20 without needing additional dose) Shingrix: 2 doses from 4 weeks to 6 months apart  Please check with your PCP to make sure you are up to date.  Low Back Sprain or Strain Rehab Ask your health care provider which exercises are safe for you. Do exercises exactly as told by your health care provider and adjust them as directed. It is normal to feel mild stretching, pulling, tightness, or discomfort as you do these exercises. Stop right away if you feel sudden pain or your pain gets worse. Do not begin these exercises until told by your health care provider. Stretching and range-of-motion exercises These exercises warm up your muscles and joints and improve the movement and flexibility of your back. These exercises also help to relieve pain, numbness, and tingling. Lumbar rotation  Lie on your back on a firm bed or the floor with your knees bent. Straighten your arms out to your sides so each arm forms a 90-degree angle (right angle) with a side of your body. Slowly move (rotate) both of your knees to one side of your body until you feel a stretch in your lower back (lumbar). Try not to let your shoulders lift off the floor. Hold this position for __________ seconds. Tense your abdominal muscles and slowly move your knees back to the starting position. Repeat this exercise on the other side of your body. Repeat __________ times. Complete this exercise __________ times a day. Single knee to chest  Lie on your back on a firm bed or the floor with both legs straight. Bend one of your knees. Use your hands to move your knee up toward your chest until you feel a gentle stretch in your lower back  and buttock. Hold your leg in this position by holding on to the front of your knee. Keep your other leg as straight as possible. Hold this position for __________ seconds. Slowly return to the starting position. Repeat with your other leg. Repeat __________ times. Complete this exercise __________ times a day. Prone extension on elbows  Lie on your abdomen on a firm bed or the floor (prone position). Prop yourself up on your elbows. Use your arms to help lift your chest up until you feel a gentle stretch in your abdomen and your lower back. This will place some of your body weight on your elbows. If this is uncomfortable, try stacking pillows under your chest. Your hips should stay down, against the surface that you are lying on. Keep your hip and back muscles relaxed. Hold this position for __________ seconds. Slowly relax your upper body and return to the starting position. Repeat __________ times. Complete this exercise __________ times a day. Strengthening exercises These exercises build strength and endurance in your back. Endurance is the ability to use your muscles for a long time, even after they get tired. Pelvic tilt This exercise strengthens the muscles that lie deep in the abdomen. Lie on your back on a firm bed or the floor with your legs extended. Bend your knees so they are pointing toward the ceiling and your feet are flat on the floor. Tighten your lower abdominal muscles to press your lower back against the floor. This motion will tilt your pelvis so  your tailbone points up toward the ceiling instead of pointing to your feet or the floor. To help with this exercise, you may place a small towel under your lower back and try to push your back into the towel. Hold this position for __________ seconds. Let your muscles relax completely before you repeat this exercise. Repeat __________ times. Complete this exercise __________ times a day. Alternating arm and leg  raises  Get on your hands and knees on a firm surface. If you are on a hard floor, you may want to use padding, such as an exercise mat, to cushion your knees. Line up your arms and legs. Your hands should be directly below your shoulders, and your knees should be directly below your hips. Lift your left leg behind you. At the same time, raise your right arm and straighten it in front of you. Do not lift your leg higher than your hip. Do not lift your arm higher than your shoulder. Keep your abdominal and back muscles tight. Keep your hips facing the ground. Do not arch your back. Keep your balance carefully, and do not hold your breath. Hold this position for __________ seconds. Slowly return to the starting position. Repeat with your right leg and your left arm. Repeat __________ times. Complete this exercise __________ times a day. Abdominal set with straight leg raise  Lie on your back on a firm bed or the floor. Bend one of your knees and keep your other leg straight. Tense your abdominal muscles and lift your straight leg up, 4-6 inches (10-15 cm) off the ground. Keep your abdominal muscles tight and hold this position for __________ seconds. Do not hold your breath. Do not arch your back. Keep it flat against the ground. Keep your abdominal muscles tense as you slowly lower your leg back to the starting position. Repeat with your other leg. Repeat __________ times. Complete this exercise __________ times a day. Single leg lower with bent knees Lie on your back on a firm bed or the floor. Tense your abdominal muscles and lift your feet off the floor, one foot at a time, so your knees and hips are bent in 90-degree angles (right angles). Your knees should be over your hips and your lower legs should be parallel to the floor. Keeping your abdominal muscles tense and your knee bent, slowly lower one of your legs so your toe touches the ground. Lift your leg back up to return to the  starting position. Do not hold your breath. Do not let your back arch. Keep your back flat against the ground. Repeat with your other leg. Repeat __________ times. Complete this exercise __________ times a day. Posture and body mechanics Good posture and healthy body mechanics can help to relieve stress in your body's tissues and joints. Body mechanics refers to the movements and positions of your body while you do your daily activities. Posture is part of body mechanics. Good posture means: Your spine is in its natural S-curve position (neutral). Your shoulders are pulled back slightly. Your head is not tipped forward (neutral). Follow these guidelines to improve your posture and body mechanics in your everyday activities. Standing  When standing, keep your spine neutral and your feet about hip-width apart. Keep a slight bend in your knees. Your ears, shoulders, and hips should line up. When you do a task in which you stand in one place for a long time, place one foot up on a stable object that is 2-4 inches (5-10  cm) high, such as a footstool. This helps keep your spine neutral. Sitting  When sitting, keep your spine neutral and keep your feet flat on the floor. Use a footrest, if necessary, and keep your thighs parallel to the floor. Avoid rounding your shoulders, and avoid tilting your head forward. When working at a desk or a computer, keep your desk at a height where your hands are slightly lower than your elbows. Slide your chair under your desk so you are close enough to maintain good posture. When working at a computer, place your monitor at a height where you are looking straight ahead and you do not have to tilt your head forward or downward to look at the screen. Resting When lying down and resting, avoid positions that are most painful for you. If you have pain with activities such as sitting, bending, stooping, or squatting, lie in a position in which your body does not bend very  much. For example, avoid curling up on your side with your arms and knees near your chest (fetal position). If you have pain with activities such as standing for a long time or reaching with your arms, lie with your spine in a neutral position and bend your knees slightly. Try the following positions: Lying on your side with a pillow between your knees. Lying on your back with a pillow under your knees. Lifting  When lifting objects, keep your feet at least shoulder-width apart and tighten your abdominal muscles. Bend your knees and hips and keep your spine neutral. It is important to lift using the strength of your legs, not your back. Do not lock your knees straight out. Always ask for help to lift heavy or awkward objects. This information is not intended to replace advice given to you by your health care provider. Make sure you discuss any questions you have with your health care provider. Document Revised: 01/30/2023 Document Reviewed: 12/14/2020 Elsevier Patient Education  2024 [employer].

## 2023-05-19 NOTE — Progress Notes (Signed)
Urine protein creatinine ratio normal, CBC with differential is normal, CMP was normal, double-stranded DNA negative, complements normal, sed rate 2, vitamin D 39.  Labs do not indicate an autoimmune disease flare.  No change in treatment advised.

## 2023-07-04 ENCOUNTER — Telehealth: Payer: Self-pay | Admitting: Cardiology

## 2023-07-04 NOTE — Telephone Encounter (Signed)
Left vm to schedule f/u appt w/Dr. Servando Salina per [facility].  JB, 07-04-23

## 2023-07-18 ENCOUNTER — Other Ambulatory Visit: Payer: Self-pay | Admitting: *Deleted

## 2023-07-18 MED ORDER — HYDROXYCHLOROQUINE SULFATE 200 MG PO TABS: ORAL_TABLET | ORAL | 0 refills | Status: DC

## 2023-07-18 NOTE — Telephone Encounter (Addendum)
Refill request received via fax from [facility] [address]  for Plaquenil   Last Fill: 03/29/2023  Eye exam: 05/26/2022 WNL   Labs: 05/17/2023 CBC with differential is normal, CMP was normal,   Next Visit: 10/18/2023  Last Visit: 05/17/2023  DX: Undifferentiated connective tissue disease   Current Dose per office note 05/17/2023: Plaquenil 200 mg 1 tablet by mouth twice daily M-F only   Left message to advise patient she is due to update her PLQ eye exam.   Okay to refill Plaquenil?

## 2023-08-10 DIAGNOSIS — Z23 Encounter for immunization: Secondary | ICD-10-CM | POA: Diagnosis not present

## 2023-08-10 DIAGNOSIS — F322 Major depressive disorder, single episode, severe without psychotic features: Secondary | ICD-10-CM | POA: Diagnosis not present

## 2023-08-10 DIAGNOSIS — G43909 Migraine, unspecified, not intractable, without status migrainosus: Secondary | ICD-10-CM | POA: Diagnosis not present

## 2023-08-10 DIAGNOSIS — E7800 Pure hypercholesterolemia, unspecified: Secondary | ICD-10-CM | POA: Diagnosis not present

## 2023-08-10 DIAGNOSIS — Z79899 Other long term (current) drug therapy: Secondary | ICD-10-CM | POA: Diagnosis not present

## 2023-08-10 DIAGNOSIS — F900 Attention-deficit hyperactivity disorder, predominantly inattentive type: Secondary | ICD-10-CM | POA: Diagnosis not present

## 2023-08-10 DIAGNOSIS — F419 Anxiety disorder, unspecified: Secondary | ICD-10-CM | POA: Diagnosis not present

## 2023-08-28 DIAGNOSIS — M25511 Pain in right shoulder: Secondary | ICD-10-CM | POA: Diagnosis not present

## 2023-08-30 DIAGNOSIS — F4310 Post-traumatic stress disorder, unspecified: Secondary | ICD-10-CM | POA: Diagnosis not present

## 2023-08-30 DIAGNOSIS — F332 Major depressive disorder, recurrent severe without psychotic features: Secondary | ICD-10-CM | POA: Diagnosis not present

## 2023-09-05 DIAGNOSIS — Z79899 Other long term (current) drug therapy: Secondary | ICD-10-CM | POA: Diagnosis not present

## 2023-09-05 DIAGNOSIS — M329 Systemic lupus erythematosus, unspecified: Secondary | ICD-10-CM | POA: Diagnosis not present

## 2023-09-06 DIAGNOSIS — F332 Major depressive disorder, recurrent severe without psychotic features: Secondary | ICD-10-CM | POA: Diagnosis not present

## 2023-09-13 DIAGNOSIS — F4310 Post-traumatic stress disorder, unspecified: Secondary | ICD-10-CM | POA: Diagnosis not present

## 2023-09-13 DIAGNOSIS — F332 Major depressive disorder, recurrent severe without psychotic features: Secondary | ICD-10-CM | POA: Diagnosis not present

## 2023-09-27 DIAGNOSIS — F4310 Post-traumatic stress disorder, unspecified: Secondary | ICD-10-CM | POA: Diagnosis not present

## 2023-09-27 DIAGNOSIS — F332 Major depressive disorder, recurrent severe without psychotic features: Secondary | ICD-10-CM | POA: Diagnosis not present

## 2023-10-06 ENCOUNTER — Other Ambulatory Visit: Payer: Self-pay | Admitting: *Deleted

## 2023-10-06 MED ORDER — HYDROXYCHLOROQUINE SULFATE 200 MG PO TABS: ORAL_TABLET | ORAL | 0 refills | Status: DC

## 2023-10-06 NOTE — Progress Notes (Deleted)
 Office Visit Note  Patient: Sharon Hartman             Date of Birth: [birth year]           MRN: [id number]             PCP: Claudene Pellet, MD Referring: Claudene Pellet, MD Visit Date: 10/18/2023 Occupation: @GUAROCC @  Subjective:    History of Present Illness: Sharon Hartman is a 42 y.o. female with history of undifferentiated connective tissue disease.  Patient remains on Plaquenil  200 mg 1 tablet by mouth twice daily M-F only   Lab work from 05/17/23 was reviewed today in the office: vitamin D  WNL, dsDNA negative, complements WNL, and ESR WNL.    CBC and CMP updated on 05/17/23.  Orders for CBC and CMP released today.   Activities of Daily Living:  Patient reports morning stiffness for *** {minute/hour:19697}.   Patient {ACTIONS;DENIES/REPORTS:21021675::Denies} nocturnal pain.  Difficulty dressing/grooming: {ACTIONS;DENIES/REPORTS:21021675::Denies} Difficulty climbing stairs: {ACTIONS;DENIES/REPORTS:21021675::Denies} Difficulty getting out of chair: {ACTIONS;DENIES/REPORTS:21021675::Denies} Difficulty using hands for taps, buttons, cutlery, and/or writing: {ACTIONS;DENIES/REPORTS:21021675::Denies}  No Rheumatology ROS completed.   PMFS History:  Patient Active Problem List   Diagnosis Date Noted   Tremor 09/12/2019   Chronic migraine 09/12/2019   Postpartum care following vaginal delivery (9/9) 06/18/2014   Post-dates pregnancy 06/17/2014   Palpitations 11/22/2013   Syncope 11/22/2013   Orthostatic hypotension 11/22/2013   ADHD (attention deficit hyperactivity disorder) 12/04/2011    Past Medical History:  Diagnosis Date   ADD (attention deficit disorder)    Anxiety    Constipation    DDD (degenerative disc disease), lumbar    Depression    Fatty liver    Galactorrhea    bilateral nipple discharge   History of basal cell carcinoma (BCC) excision    back 2017 excision   History of cervical dysplasia    CIN II  s/p LEEP 06/ 2005   History of suicide  attempt    2003; 2006   History of syncope    02/ 2015  w/ palpitations and pregnant--- evaluation by cardiology, dr mona, note in epic 03/ 2015, work-up done w/ normal echo and event monitor showed SR with isolated PVCs/ PACs   Hyperlipidemia    Inappropriate sinus tachycardia    Low testosterone    Low vitamin D  level    Lupus (HCC)    Migraine    Numbness    bilateral hands and feet due to raynaurd's disease   Polyarthralgia    PONV (postoperative nausea and vomiting)    also lightweight when it comes to anesthesia   Raynaud's disease    rheumotology--- dr dolphus--  with bilateral hand and feet numbness, and nipples per pt;  takes norvasc    Right lower quadrant pain    Scoliosis    thoracic   Sicca syndrome (HCC)    Tremor, unspecified    intermittant left hand/ wrist tremor with dorsiflexion  ( neurology evaluation by dr tat note in epic 08-15-2019, felt to be clonus)    Family History  Problem Relation Age of Onset   Heart murmur Mother    Diabetes Mother    Hepatitis Mother        auto immune    Breast cancer Paternal Grandmother    Hypertension Paternal Grandmother    Hyperlipidemia Paternal Grandmother    Heart disease Paternal Grandmother    Hypertension Paternal Grandfather    Heart disease Paternal Grandfather        CABG  Heart attack Paternal Grandfather        X4   Hyperlipidemia Paternal Grandfather    Hypertension Father    Hyperlipidemia Father    Hashimoto's thyroiditis Sister    Depression Sister    Breast cancer Sister    Anxiety disorder Sister    Scoliosis Brother    Drug abuse Brother    Autism Son    Past Surgical History:  Procedure Laterality Date   DILATATION & CURETTAGE/HYSTEROSCOPY WITH MYOSURE N/A 08/23/2019   Procedure: DILATATION & CURETTAGE/HYSTEROSCOPY WITH MYOSURE;  Surgeon: Rutherford Gain, MD;  Location: [facility];  Service: Gynecology;  Laterality: N/A;   DILATION AND CURETTAGE OF UTERUS  1999    GANGLION CYST EXCISION Right 2011   foot   LAPAROSCOPIC LYSIS OF ADHESIONS N/A 12/23/2019   Procedure: DIAGNOSTIC LAPAROSCOPY WITH PERITONEAL BIOPSY;  Surgeon: Rutherford Gain, MD;  Location: [facility];  Service: Gynecology;  Laterality: N/A;  2 1/2 hrs.   LEEP  03-31-2004   @[facility]    CIN II   TONSILLECTOMY  1998   WISDOM TOOTH EXTRACTION  1999   Social History   Social History Narrative   Lives at home with husband and son.   Right-handed.   No caffeine use.   Immunization History  Administered Date(s) Administered   PFIZER(Purple Top)SARS-COV-2 Vaccination 10/22/2019, 11/12/2019, 05/28/2020   PPD Test 02/13/2012, 11/14/2017, 12/25/2017   Tdap 02/13/2012     Objective: Vital Signs: There were no vitals taken for this visit.   Physical Exam Vitals and nursing note reviewed.  Constitutional:      Appearance: She is well-developed.  HENT:     Head: Normocephalic and atraumatic.  Eyes:     Conjunctiva/sclera: Conjunctivae normal.  Cardiovascular:     Rate and Rhythm: Normal rate and regular rhythm.     Heart sounds: Normal heart sounds.  Pulmonary:     Effort: Pulmonary effort is normal.     Breath sounds: Normal breath sounds.  Abdominal:     General: Bowel sounds are normal.     Palpations: Abdomen is soft.  Musculoskeletal:     Cervical back: Normal range of motion.  Lymphadenopathy:     Cervical: No cervical adenopathy.  Skin:    General: Skin is warm and dry.     Capillary Refill: Capillary refill takes less than 2 seconds.  Neurological:     Mental Status: She is alert and oriented to person, place, and time.  Psychiatric:        Behavior: Behavior normal.      Musculoskeletal Exam: ***  CDAI Exam: CDAI Score: -- Patient Global: --; Provider Global: -- Swollen: --; Tender: -- Joint Exam 10/18/2023   No joint exam has been documented for this visit   There is currently no information documented on the homunculus. Go to the  Rheumatology activity and complete the homunculus joint exam.  Investigation: No additional findings.  Imaging: No results found.  Recent Labs: Lab Results  Component Value Date   WBC 8.0 05/17/2023   HGB 15.3 05/17/2023   PLT 372 05/17/2023   NA 136 05/17/2023   K 3.6 05/17/2023   CL 101 05/17/2023   CO2 23 05/17/2023   GLUCOSE 79 05/17/2023   BUN 6 (L) 05/17/2023   CREATININE 0.60 05/17/2023   BILITOT 0.5 05/17/2023   ALKPHOS 39 10/16/2022   AST 16 05/17/2023   ALT 11 05/17/2023   PROT 7.0 05/17/2023   ALBUMIN 4.4 10/16/2022   CALCIUM   9.9 05/17/2023   GFRAA 127 12/22/2020    Speciality Comments: PLQ Eye Exam 05/26/2022 WNL @ [facility] Follow up in 1 year  PLQ Eye Exam scheduled for 09/05/2023 @ 9:00 am  Procedures:  No procedures performed Allergies: Patient has no known allergies.   Assessment / Plan:     Visit Diagnoses: Undifferentiated connective tissue disease (HCC)  High risk medication use  Raynaud's disease without gangrene  Sicca syndrome (HCC)  Other fatigue  Vitamin D  deficiency  Trochanteric bursitis of both hips  Other idiopathic scoliosis, thoracic region  Degeneration of intervertebral disc of lumbar region without discogenic back pain or lower extremity pain  Orthostatic hypotension  History of ADHD  Anxiety and depression  Fatty liver  Essential tremor  Orders: No orders of the defined types were placed in this encounter.  No orders of the defined types were placed in this encounter.   Face-to-face time spent with patient was *** minutes. Greater than 50% of time was spent in counseling and coordination of care.  Follow-Up Instructions: No follow-ups on file.   Waddell CHRISTELLA Craze, PA-C  Note - This record has been created using Dragon software.  Chart creation errors have been sought, but may not always  have been located. Such creation errors do not reflect on  the standard of medical care.

## 2023-10-06 NOTE — Telephone Encounter (Signed)
Refill request received via fax from [facility] [address]  for Plaquenil  Last Fill: 07/18/2023  Eye exam: 05/26/2022 WNL Patient states she has updated  her PLQ eye exam. Patient states she will call eye doctor and have them fax results.    Labs: 05/17/2023 CBC with differential is normal, CMP was normal   Next Visit: 10/18/2023  Last Visit: 05/17/2023  DX: Undifferentiated connective tissue disease   Current Dose per office note 05/17/2023:  Plaquenil 200 mg 1 tablet by mouth twice daily M-F only   Okay to refill Plaquenil?

## 2023-10-18 ENCOUNTER — Ambulatory Visit: Payer: BC Managed Care – PPO | Admitting: Physician Assistant

## 2023-10-18 DIAGNOSIS — M3500 Sicca syndrome, unspecified: Secondary | ICD-10-CM

## 2023-10-18 DIAGNOSIS — M51369 Other intervertebral disc degeneration, lumbar region without mention of lumbar back pain or lower extremity pain: Secondary | ICD-10-CM

## 2023-10-18 DIAGNOSIS — F419 Anxiety disorder, unspecified: Secondary | ICD-10-CM

## 2023-10-18 DIAGNOSIS — M359 Systemic involvement of connective tissue, unspecified: Secondary | ICD-10-CM

## 2023-10-18 DIAGNOSIS — M7061 Trochanteric bursitis, right hip: Secondary | ICD-10-CM

## 2023-10-18 DIAGNOSIS — G250 Essential tremor: Secondary | ICD-10-CM

## 2023-10-18 DIAGNOSIS — M4124 Other idiopathic scoliosis, thoracic region: Secondary | ICD-10-CM

## 2023-10-18 DIAGNOSIS — K760 Fatty (change of) liver, not elsewhere classified: Secondary | ICD-10-CM

## 2023-10-18 DIAGNOSIS — I7300 Raynaud's syndrome without gangrene: Secondary | ICD-10-CM

## 2023-10-18 DIAGNOSIS — I951 Orthostatic hypotension: Secondary | ICD-10-CM

## 2023-10-18 DIAGNOSIS — R5383 Other fatigue: Secondary | ICD-10-CM

## 2023-10-18 DIAGNOSIS — E559 Vitamin D deficiency, unspecified: Secondary | ICD-10-CM

## 2023-10-18 DIAGNOSIS — Z79899 Other long term (current) drug therapy: Secondary | ICD-10-CM

## 2023-10-18 DIAGNOSIS — Z8659 Personal history of other mental and behavioral disorders: Secondary | ICD-10-CM

## 2023-10-23 NOTE — Progress Notes (Signed)
Office Visit Note  Patient: Sharon Hartman             Date of Birth: [birth year]           MRN: [id number]             PCP: Merri Brunette, MD Referring: Merri Brunette, MD Visit Date: 11/03/2023 Occupation: @GUAROCC @  Subjective:  Raynaud's phenomenon   History of Present Illness: TELIYAH Hartman is a 43 y.o. female with history of undifferentiated connective tissue disease.  Patient remains on Plaquenil 200 mg 1 tablet by mouth twice daily M-F only. She continues to tolerate plaquenil without any side effects and has not had any recent gaps in therapy.  She denies any increased joint pain or joint swelling at this time.  She has not had any morning stiffness, nocturnal pain, or difficulty with ADLs.  Patient continues to have chronic mouth dryness as well as intermittent sores in her nose.  Is not been using any over-the-counter products for symptomatic relief.  She has also had an increase frequency of symptoms of Raynaud's phenomena especially in her toes during the winter months.  Sometimes these episodes last for several days. She remains on amlodipine 5 mg daily and requested a refill to be sent to the pharmacy today.  Patient denies any recent rashes. She takes vitamin D 5000 units twice weekly.   Activities of Daily Living:  Patient reports morning stiffness for 0 minutes.   Patient Denies nocturnal pain.  Difficulty dressing/grooming: Denies Difficulty climbing stairs: Denies Difficulty getting out of chair: Denies Difficulty using hands for taps, buttons, cutlery, and/or writing: Denies  Review of Systems  Constitutional:  Positive for fatigue.  HENT:  Positive for mouth sores, mouth dryness and nose dryness.        Nose sores   Eyes:  Negative for pain, visual disturbance and dryness.  Respiratory:  Negative for shortness of breath and difficulty breathing.   Cardiovascular:  Negative for chest pain, palpitations and swelling in legs/feet.  Gastrointestinal:  Negative  for blood in stool, constipation and diarrhea.  Endocrine: Negative for increased urination.  Genitourinary:  Negative for painful urination and involuntary urination.  Musculoskeletal:  Positive for joint pain, joint pain, myalgias, muscle tenderness and myalgias. Negative for gait problem, joint swelling, muscle weakness and morning stiffness.  Skin:  Positive for color change, rash and sensitivity to sunlight. Negative for hair loss.  Allergic/Immunologic: Negative for susceptible to infections.  Neurological:  Positive for numbness. Negative for dizziness and headaches.  Hematological:  Negative for swollen glands.  Psychiatric/Behavioral:  Negative for depressed mood and sleep disturbance. The patient is not nervous/anxious.     PMFS History:  Patient Active Problem List   Diagnosis Date Noted   Tremor 09/12/2019   Chronic migraine 09/12/2019   Postpartum care following vaginal delivery (9/9) 06/18/2014   Post-dates pregnancy 06/17/2014   Palpitations 11/22/2013   Syncope 11/22/2013   Orthostatic hypotension 11/22/2013   ADHD (attention deficit hyperactivity disorder) 12/04/2011    Past Medical History:  Diagnosis Date   ADD (attention deficit disorder)    Anxiety    Constipation    DDD (degenerative disc disease), lumbar    Depression    Fatty liver    Galactorrhea    bilateral nipple discharge   History of basal cell carcinoma (BCC) excision    back 2017 excision   History of cervical dysplasia    CIN II  s/p LEEP 06/ 2005   History of  suicide attempt    2003; 2006   History of syncope    02/ 2015  w/ palpitations and pregnant--- evaluation by cardiology, dr Rennis Golden, note in epic 03/ 2015, work-up done w/ normal echo and event monitor showed SR with isolated PVCs/ PACs   Hyperlipidemia    Inappropriate sinus tachycardia (HCC)    Low testosterone    Low vitamin D level    Lupus    Migraine    Numbness    bilateral hands and feet due to raynaurd's disease    Polyarthralgia    PONV (postoperative nausea and vomiting)    also lightweight when it comes to anesthesia   Raynaud's disease    rheumotology--- dr Corliss Skains--  with bilateral hand and feet numbness, and nipples per pt;  takes norvasc   Right lower quadrant pain    Scoliosis    thoracic   Sicca syndrome (HCC)    Tremor, unspecified    intermittant left hand/ wrist tremor with dorsiflexion  ( neurology evaluation by dr tat note in epic 08-15-2019, felt to be clonus)    Family History  Problem Relation Age of Onset   Heart murmur Mother    Diabetes Mother    Hepatitis Mother        auto immune    Breast cancer Paternal Grandmother    Hypertension Paternal Grandmother    Hyperlipidemia Paternal Grandmother    Heart disease Paternal Grandmother    Hypertension Paternal Grandfather    Heart disease Paternal Grandfather        CABG   Heart attack Paternal Grandfather        X4   Hyperlipidemia Paternal Grandfather    Hypertension Father    Hyperlipidemia Father    Hashimoto's thyroiditis Sister    Depression Sister    Breast cancer Sister    Anxiety disorder Sister    Scoliosis Brother    Drug abuse Brother    Autism Son    Past Surgical History:  Procedure Laterality Date   DILATATION & CURETTAGE/HYSTEROSCOPY WITH MYOSURE N/A 08/23/2019   Procedure: DILATATION & CURETTAGE/HYSTEROSCOPY WITH MYOSURE;  Surgeon: Maxie Better, MD;  Location: [facility];  Service: Gynecology;  Laterality: N/A;   DILATION AND CURETTAGE OF UTERUS  1999   GANGLION CYST EXCISION Right 2011   foot   LAPAROSCOPIC LYSIS OF ADHESIONS N/A 12/23/2019   Procedure: DIAGNOSTIC LAPAROSCOPY WITH PERITONEAL BIOPSY;  Surgeon: Maxie Better, MD;  Location: [facility] ;  Service: Gynecology;  Laterality: N/A;  2 1/2 hrs.   LEEP  03-31-2004   @[facility]    CIN II   TONSILLECTOMY  1998   WISDOM TOOTH EXTRACTION  1999   Social History   Social History Narrative   Lives at  home with husband and son.   Right-handed.   No caffeine use.   Immunization History  Administered Date(s) Administered   PFIZER(Purple Top)SARS-COV-2 Vaccination 10/22/2019, 11/12/2019, 05/28/2020   PPD Test 02/13/2012, 11/14/2017, 12/25/2017   Tdap 02/13/2012     Objective: Vital Signs: BP 92/68 (BP Location: Left Arm, Patient Position: Sitting, Cuff Size: Normal)   Pulse 92   Resp 14   Ht 5\' 6"  (1.676 m)   Wt 134 lb 9.6 oz (61.1 kg)   BMI 21.73 kg/m    Physical Exam Vitals and nursing note reviewed.  Constitutional:      Appearance: She is well-developed.  HENT:     Head: Normocephalic and atraumatic.  Eyes:     Conjunctiva/sclera:  Conjunctivae normal.  Cardiovascular:     Rate and Rhythm: Normal rate and regular rhythm.     Heart sounds: Normal heart sounds.  Pulmonary:     Effort: Pulmonary effort is normal.     Breath sounds: Normal breath sounds.  Abdominal:     General: Bowel sounds are normal.     Palpations: Abdomen is soft.  Musculoskeletal:     Cervical back: Normal range of motion.  Lymphadenopathy:     Cervical: No cervical adenopathy.  Skin:    General: Skin is warm and dry.     Capillary Refill: Capillary refill takes less than 2 seconds.  Neurological:     Mental Status: She is alert and oriented to person, place, and time.  Psychiatric:        Behavior: Behavior normal.      Musculoskeletal Exam: C-spine, thoracic spine, and lumbar spine good ROM.  Shoulder joints, elbow joints, wrist joints, MCPs, PIPs, and DIPs good ROM with no synovitis.  Complete fist formation bilaterally.  Hip joints have good ROM with no groin pain.  Knee joints have good ROM with no warmth or effusion.  Ankle joints have good ROM with no tenderness or joint swelling.    CDAI Exam: CDAI Score: -- Patient Global: --; Provider Global: -- Swollen: --; Tender: -- Joint Exam 11/03/2023   No joint exam has been documented for this visit   There is currently no  information documented on the homunculus. Go to the Rheumatology activity and complete the homunculus joint exam.  Investigation: No additional findings.  Imaging: No results found.  Recent Labs: Lab Results  Component Value Date   WBC 8.0 05/17/2023   HGB 15.3 05/17/2023   PLT 372 05/17/2023   NA 136 05/17/2023   K 3.6 05/17/2023   CL 101 05/17/2023   CO2 23 05/17/2023   GLUCOSE 79 05/17/2023   BUN 6 (L) 05/17/2023   CREATININE 0.60 05/17/2023   BILITOT 0.5 05/17/2023   ALKPHOS 39 10/16/2022   AST 16 05/17/2023   ALT 11 05/17/2023   PROT 7.0 05/17/2023   ALBUMIN 4.4 10/16/2022   CALCIUM 9.9 05/17/2023   GFRAA 127 12/22/2020    Speciality Comments: PLQ Eye Exam 09/05/2023 WNL @ [facility] Follow up in 1 year  Procedures:  No procedures performed Allergies: Patient has no known allergies.    Assessment / Plan:     Visit Diagnoses: Undifferentiated connective tissue disease (HCC) - Lupus anticoagulant detected on 04/27/2020, later negative.  AVISE index negative: -2.8.  Anti-C1q IgG is positive, rest of panel negative on 05/15/20: She has not had any signs or symptoms of a flare.  She has clinically been doing well taking Plaquenil 200 mg 1 tablet by mouth twice daily Monday through Friday.  She is tolerating Plaquenil without any side effects and has not had any recent gaps in therapy.  She has no synovitis on examination today.  Her energy level has been stable.  She continues to have chronic mouth dryness and has intermittent sores in her nose.  She has not needed to use any over-the-counter products for symptomatic relief.  She has been experiencing an increased frequency of symptoms of Raynaud's phenomena especially in her feet.  She remains on amlodipine 5 mg daily.  Her blood pressure was only 92/68 today in the office.  Discussed possibly reducing amlodipine to 2.5 mg daily but she is hesitant due to worsening symptoms of Raynaud's phenomenon.  Discussed the option of using  nitroglycerin  ointment and then reducing the dose of amlodipine to 2.5 mg daily.  Patient plans on monitoring her blood pressure closely but would like to keep the dose of amlodipine as is for now. Lab work from 05/17/23 was reviewed today in the office: vitamin D WNL, dsDNA negative, complements WNL, and ESR WNL.  Patient has had a change of insurance with her new job role and states that her new insurance will take effect in March 2025.  Patient requested to return for updated lab work in [facility] orders were placed today.  She will remain on plaquenil as prescribed.  She was advised to notify us if she develops signs or symptoms of a flare.  She will follow up in 5 months or sooner if needed.  - Plan: COMPLETE METABOLIC PANEL WITH GFR, CBC with Differential/Platelet, Protein / creatinine ratio, urine, Anti-DNA antibody, double-stranded, Sedimentation rate, C3 and C4  High risk medication use - Plaquenil 200 mg 1 tablet by mouth twice daily M-F only (Started at the end of July 2021).  CBC and CMP updated on 05/17/23.  She will be due to returning for updated lab work March 2025 once she has insurance-future orders placed.  PLQ Eye Exam 09/05/2023 WNL @ [facility] Follow up in 1 year  - Plan: COMPLETE METABOLIC PANEL WITH GFR, CBC with Differential/Platelet  Raynaud's disease without gangrene - She has been experiencing increased frequency of symptoms of Raynaud's phenomenon with the cooler weather temperatures.  She continues to take amlodipine 5 mg 1 tablet by mouth daily. Patient would like to try nitroglycerin ointment topically for symptomatic relief.  She will notify us if she cannot tolerate using nitroglycerin ointment. Patient was advised to monitor blood pressure closely while taking amlodipine.   Sicca syndrome [facility]): Patient continues to have chronic mouth dryness but has not needed to use any over-the-counter products for symptomatic relief. She tries to drink fluids throughout the day which  has been helpful.  She is not currently experiencing the eye dryness.  Other fatigue: Chronic, stable.  Vitamin D deficiency: Vitamin D was 39 on 05/17/2023.  She is taking Vitamin D 5000 units twice weekly.  Trochanteric bursitis of both hips: Not currently symptomatic.   Other idiopathic scoliosis, thoracic region  Degeneration of intervertebral disc of lumbar region without discogenic back pain or lower extremity pain  Other medical conditions are listed as follows:  Orthostatic hypotension  History of ADHD  Anxiety and depression  Fatty liver  Essential tremor  Orders: Orders Placed This Encounter  Procedures   COMPLETE METABOLIC PANEL WITH GFR   CBC with Differential/Platelet   Protein / creatinine ratio, urine   Anti-DNA antibody, double-stranded   Sedimentation rate   C3 and C4   Meds ordered this encounter  Medications   nitroGLYCERIN (NITROGLYN) 2 % ointment    Sig: Apply 1/4 inch to fingertips three times daily.    Dispense:  30 g    Refill:  0   amLODipine (NORVASC) 5 MG tablet    Sig: TAKE 1 TABLET BY MOUTH EVERYDAY AT BEDTIME    Dispense:  90 tablet    Refill:  0     Follow-Up Instructions: Return in about 5 months (around 04/02/2024) for UCTD.   Gearldine Bienenstock, PA-C  Note - This record has been created using Dragon software.  Chart creation errors have been sought, but may not always  have been located. Such creation errors do not reflect on  the standard of medical care.

## 2023-11-03 ENCOUNTER — Encounter: Payer: Self-pay | Admitting: Physician Assistant

## 2023-11-03 ENCOUNTER — Ambulatory Visit: Payer: Self-pay | Attending: Physician Assistant | Admitting: Physician Assistant

## 2023-11-03 VITALS — BP 92/68 | HR 92 | Resp 14 | Ht 66.0 in | Wt 134.6 lb

## 2023-11-03 DIAGNOSIS — G250 Essential tremor: Secondary | ICD-10-CM

## 2023-11-03 DIAGNOSIS — E559 Vitamin D deficiency, unspecified: Secondary | ICD-10-CM

## 2023-11-03 DIAGNOSIS — M359 Systemic involvement of connective tissue, unspecified: Secondary | ICD-10-CM

## 2023-11-03 DIAGNOSIS — M7061 Trochanteric bursitis, right hip: Secondary | ICD-10-CM

## 2023-11-03 DIAGNOSIS — R5383 Other fatigue: Secondary | ICD-10-CM

## 2023-11-03 DIAGNOSIS — I951 Orthostatic hypotension: Secondary | ICD-10-CM

## 2023-11-03 DIAGNOSIS — M4124 Other idiopathic scoliosis, thoracic region: Secondary | ICD-10-CM

## 2023-11-03 DIAGNOSIS — I7300 Raynaud's syndrome without gangrene: Secondary | ICD-10-CM

## 2023-11-03 DIAGNOSIS — F419 Anxiety disorder, unspecified: Secondary | ICD-10-CM

## 2023-11-03 DIAGNOSIS — M51369 Other intervertebral disc degeneration, lumbar region without mention of lumbar back pain or lower extremity pain: Secondary | ICD-10-CM

## 2023-11-03 DIAGNOSIS — K760 Fatty (change of) liver, not elsewhere classified: Secondary | ICD-10-CM

## 2023-11-03 DIAGNOSIS — Z79899 Other long term (current) drug therapy: Secondary | ICD-10-CM

## 2023-11-03 DIAGNOSIS — M3500 Sicca syndrome, unspecified: Secondary | ICD-10-CM

## 2023-11-03 DIAGNOSIS — F32A Depression, unspecified: Secondary | ICD-10-CM

## 2023-11-03 DIAGNOSIS — Z8659 Personal history of other mental and behavioral disorders: Secondary | ICD-10-CM

## 2023-11-03 DIAGNOSIS — M7062 Trochanteric bursitis, left hip: Secondary | ICD-10-CM

## 2023-11-03 MED ORDER — AMLODIPINE BESYLATE 5 MG PO TABS: ORAL_TABLET | ORAL | 0 refills | Status: AC

## 2023-11-03 MED ORDER — NITROGLYCERIN 2 % TD OINT: TOPICAL_OINTMENT | TRANSDERMAL | 0 refills | Status: DC

## 2024-01-19 ENCOUNTER — Other Ambulatory Visit: Payer: Self-pay | Admitting: *Deleted

## 2024-01-19 MED ORDER — HYDROXYCHLOROQUINE SULFATE 200 MG PO TABS: ORAL_TABLET | ORAL | 0 refills | Status: DC

## 2024-01-19 NOTE — Telephone Encounter (Signed)
 Refill request received via fax from [facility] [address]  for Plaquenil   Last Fill: 10/06/2023  Eye exam: 09/05/2023 WNL   Labs: 05/17/2023 Urine protein creatinine ratio normal, CBC with differential is normal, CMP was normal, double-stranded DNA negative, complements normal, sed rate 2, vitamin D 39.  Labs do not indicate an autoimmune disease flare.  No change in treatment advised.   Next Visit: 04/17/2024  Last Visit: 11/03/2023  DX: Undifferentiated connective tissue disease   Current Dose per office note 11/03/2023: Plaquenil 200 mg 1 tablet by mouth twice daily M-F only   Left message to advise patient she is due to update labs.   Okay to refill Plaquenil?

## 2024-02-13 ENCOUNTER — Ambulatory Visit: Payer: Self-pay | Admitting: Physical Therapy

## 2024-02-29 ENCOUNTER — Other Ambulatory Visit: Payer: Self-pay | Admitting: *Deleted

## 2024-02-29 DIAGNOSIS — M359 Systemic involvement of connective tissue, unspecified: Secondary | ICD-10-CM

## 2024-02-29 DIAGNOSIS — Z79899 Other long term (current) drug therapy: Secondary | ICD-10-CM

## 2024-03-01 ENCOUNTER — Ambulatory Visit: Payer: Self-pay | Admitting: Physician Assistant

## 2024-03-01 ENCOUNTER — Encounter: Payer: Self-pay | Admitting: Rheumatology

## 2024-03-01 DIAGNOSIS — M359 Systemic involvement of connective tissue, unspecified: Secondary | ICD-10-CM

## 2024-03-01 DIAGNOSIS — D582 Other hemoglobinopathies: Secondary | ICD-10-CM

## 2024-03-01 LAB — CBC WITH DIFFERENTIAL/PLATELET
Absolute Lymphocytes: 2421 {cells}/uL (ref 850–3900)
Absolute Monocytes: 351 {cells}/uL (ref 200–950)
Basophils Absolute: 36 {cells}/uL (ref 0–200)
Basophils Relative: 0.4 %
Eosinophils Absolute: 81 {cells}/uL (ref 15–500)
Eosinophils Relative: 0.9 %
HCT: 47 % — ABNORMAL HIGH (ref 35.0–45.0)
Hemoglobin: 15.7 g/dL — ABNORMAL HIGH (ref 11.7–15.5)
MCH: 31.2 pg (ref 27.0–33.0)
MCHC: 33.4 g/dL (ref 32.0–36.0)
MCV: 93.3 fL (ref 80.0–100.0)
MPV: 9.3 fL (ref 7.5–12.5)
Monocytes Relative: 3.9 %
Neutro Abs: 6111 {cells}/uL (ref 1500–7800)
Neutrophils Relative %: 67.9 %
Platelets: 375 10*3/uL (ref 140–400)
RBC: 5.04 10*6/uL (ref 3.80–5.10)
RDW: 11.8 % (ref 11.0–15.0)
Total Lymphocyte: 26.9 %
WBC: 9 10*3/uL (ref 3.8–10.8)

## 2024-03-01 LAB — COMPLETE METABOLIC PANEL WITHOUT GFR
AG Ratio: 2 (calc) (ref 1.0–2.5)
ALT: 12 U/L (ref 6–29)
AST: 19 U/L (ref 10–30)
Albumin: 5.2 g/dL — ABNORMAL HIGH (ref 3.6–5.1)
Alkaline phosphatase (APISO): 32 U/L (ref 31–125)
BUN: 12 mg/dL (ref 7–25)
CO2: 28 mmol/L (ref 20–32)
Calcium: 10.3 mg/dL — ABNORMAL HIGH (ref 8.6–10.2)
Chloride: 100 mmol/L (ref 98–110)
Creat: 0.68 mg/dL (ref 0.50–0.99)
Globulin: 2.6 g/dL (ref 1.9–3.7)
Glucose, Bld: 96 mg/dL (ref 65–99)
Potassium: 4.3 mmol/L (ref 3.5–5.3)
Sodium: 137 mmol/L (ref 135–146)
Total Bilirubin: 0.5 mg/dL (ref 0.2–1.2)
Total Protein: 7.8 g/dL (ref 6.1–8.1)

## 2024-03-01 LAB — PROTEIN / CREATININE RATIO, URINE
Creatinine, Urine: 49 mg/dL (ref 20–275)
Protein/Creat Ratio: 82 mg/g{creat} (ref 24–184)
Protein/Creatinine Ratio: 0.082 mg/mg{creat} (ref 0.024–0.184)
Total Protein, Urine: 4 mg/dL — ABNORMAL LOW (ref 5–24)

## 2024-03-01 LAB — ANTI-DNA ANTIBODY, DOUBLE-STRANDED: ds DNA Ab: <1 [IU]/mL

## 2024-03-01 LAB — SEDIMENTATION RATE: Sed Rate: 2 mm/h (ref 0–20)

## 2024-03-01 LAB — C3 AND C4
C3 Complement: 101 mg/dL (ref 83–193)
C4 Complement: 21 mg/dL (ref 15–57)

## 2024-03-01 NOTE — Progress Notes (Signed)
 Urine protein creatinine ratio WNL.  Complements WNL ESR WNL Hgb and hct are slightly elevated-rest of CBC Wnl.  We will continue to monitor.   Calcium  is elevated-10.3--please clarify if she is taking a calcium  or vitamin D  supplement?

## 2024-03-01 NOTE — Progress Notes (Signed)
 Order signed

## 2024-03-01 NOTE — Telephone Encounter (Signed)
 Referral was placed to Hematology.

## 2024-03-01 NOTE — Telephone Encounter (Signed)
 Please pend hematology referral

## 2024-03-05 NOTE — Progress Notes (Signed)
dsDNA is negative.  Labs are not consistent with a flare.

## 2024-03-22 ENCOUNTER — Other Ambulatory Visit: Payer: Self-pay | Admitting: Rheumatology

## 2024-03-22 NOTE — Telephone Encounter (Signed)
 Last Fill: 01/09/2024 (30 day supply)  Eye exam: 09/05/2023 WNL    Labs: 02/29/2024 Urine protein creatinine ratio WNL.  Complements WNL  ESR WNL  Hgb and hct are slightly elevated-rest of CBC Wnl. Calcium  is elevated-10.3-   Next Visit: 04/17/2024  Last Visit: 11/03/2023  DX: Undifferentiated connective tissue disease   Current Dose per office note 11/03/2023: Plaquenil  200 mg 1 tablet by mouth twice daily M-F only   Okay to refill Plaquenil ?

## 2024-03-22 NOTE — Telephone Encounter (Signed)
 Last Fill: 01/19/2024  Eye exam: 09/05/2023 WNL   Labs: 03/01/2024 Urine protein creatinine ratio WNL.  Complements WNL  ESR WNL  Hgb and hct are slightly elevated-rest of CBC Wnl.  We will continue to monitor.   Calcium  is elevated-10.3   Next Visit: 04/17/2024  Last Visit: 11/03/2023  DX: Undifferentiated connective tissue disease   Current Dose per office note 11/03/2023: Plaquenil  200 mg 1 tablet by mouth twice daily Monday through Friday   Okay to refill Plaquenil ?

## 2024-04-05 ENCOUNTER — Inpatient Hospital Stay: Attending: Oncology | Admitting: Oncology

## 2024-04-05 ENCOUNTER — Inpatient Hospital Stay

## 2024-04-05 ENCOUNTER — Encounter: Payer: Self-pay | Admitting: Oncology

## 2024-04-05 VITALS — BP 122/83 | HR 111 | Temp 97.8°F | Resp 20 | Ht 66.0 in | Wt 131.5 lb

## 2024-04-05 DIAGNOSIS — D751 Secondary polycythemia: Secondary | ICD-10-CM

## 2024-04-05 DIAGNOSIS — Z87891 Personal history of nicotine dependence: Secondary | ICD-10-CM | POA: Insufficient documentation

## 2024-04-05 DIAGNOSIS — M359 Systemic involvement of connective tissue, unspecified: Secondary | ICD-10-CM | POA: Insufficient documentation

## 2024-04-05 DIAGNOSIS — Z79899 Other long term (current) drug therapy: Secondary | ICD-10-CM | POA: Diagnosis not present

## 2024-04-05 DIAGNOSIS — Z803 Family history of malignant neoplasm of breast: Secondary | ICD-10-CM | POA: Insufficient documentation

## 2024-04-05 DIAGNOSIS — I7300 Raynaud's syndrome without gangrene: Secondary | ICD-10-CM | POA: Insufficient documentation

## 2024-04-05 LAB — CBC WITH DIFFERENTIAL (CANCER CENTER ONLY)
Abs Immature Granulocytes: 0.02 10*3/uL (ref 0.00–0.07)
Basophils Absolute: 0.1 10*3/uL (ref 0.0–0.1)
Basophils Relative: 1 %
Eosinophils Absolute: 0 10*3/uL (ref 0.0–0.5)
Eosinophils Relative: 1 %
HCT: 38.6 % (ref 36.0–46.0)
Hemoglobin: 13.9 g/dL (ref 12.0–15.0)
Immature Granulocytes: 0 %
Lymphocytes Relative: 51 %
Lymphs Abs: 2.6 10*3/uL (ref 0.7–4.0)
MCH: 31.2 pg (ref 26.0–34.0)
MCHC: 36 g/dL (ref 30.0–36.0)
MCV: 86.7 fL (ref 80.0–100.0)
Monocytes Absolute: 0.3 10*3/uL (ref 0.1–1.0)
Monocytes Relative: 6 %
Neutro Abs: 2.2 10*3/uL (ref 1.7–7.7)
Neutrophils Relative %: 41 %
Platelet Count: 278 10*3/uL (ref 150–400)
RBC: 4.45 MIL/uL (ref 3.87–5.11)
RDW: 12.1 % (ref 11.5–15.5)
WBC Count: 5.2 10*3/uL (ref 4.0–10.5)
nRBC: 0 % (ref 0.0–0.2)

## 2024-04-05 LAB — CMP (CANCER CENTER ONLY)
ALT: 18 U/L (ref 0–44)
AST: 19 U/L (ref 15–41)
Albumin: 4.8 g/dL (ref 3.5–5.0)
Alkaline Phosphatase: 40 U/L (ref 38–126)
Anion gap: 7 (ref 5–15)
BUN: 7 mg/dL (ref 6–20)
CO2: 29 mmol/L (ref 22–32)
Calcium: 9.8 mg/dL (ref 8.9–10.3)
Chloride: 104 mmol/L (ref 98–111)
Creatinine: 0.69 mg/dL (ref 0.44–1.00)
GFR, Estimated: >60 mL/min (ref >60)
Glucose, Bld: 87 mg/dL (ref 70–99)
Potassium: 3.4 mmol/L — ABNORMAL LOW (ref 3.5–5.1)
Sodium: 140 mmol/L (ref 135–145)
Total Bilirubin: 0.4 mg/dL (ref 0.0–1.2)
Total Protein: 7.3 g/dL (ref 6.5–8.1)

## 2024-04-05 LAB — FERRITIN: Ferritin: 85 ng/mL (ref 11–307)

## 2024-04-05 LAB — IRON AND IRON BINDING CAPACITY (CC-WL,HP ONLY)
Iron: 92 ug/dL (ref 28–170)
Saturation Ratios: 26 % (ref 10.4–31.8)
TIBC: 349 ug/dL (ref 250–450)
UIBC: 257 ug/dL (ref 148–442)

## 2024-04-05 NOTE — Progress Notes (Signed)
 [facility]  HEMATOLOGY CLINIC CONSULTATION NOTE    PATIENT NAME: Sharon Hartman   MR#: [id number] DOB: Dec 27, 1980  DATE OF SERVICE: 04/05/2024   REFERRING PHYSICIAN  Waddell Cheryl RIGGERS, Rheumatology   Patient Care Team: Claudene Pellet, MD as PCP - General (Family Medicine) Sheena Pugh, DO as PCP - Cardiology (Cardiology) Cheryl Waddell CHRISTELLA, PA-C as Physician Assistant (Physician Assistant) Dolphus Reiter, MD as Consulting Physician (Rheumatology)    REASON FOR CONSULTATION/ CHIEF COMPLAINT: Polycythemia  ASSESSMENT & PLAN:  Sharon Hartman is a 43 y.o. lady with past medical history of undifferentiated connective tissue disease, currently on Plaquenil , Raynaud's disease, dyslipidemia, anxiety, was referred to our clinic for evaluation of polycythemia.  Polycythemia Polycythemia with hemoglobin of 15.7 and hematocrit of 47 on 02/29/2024, slightly above normal range.   Differential includes primary polycythemia and secondary causes such as dehydration. No smoking or sleep apnea reported.  Labs today actually showed much improved numbers with hemoglobin of 13.9, hematocrit 38.6.  White count and platelet count are within normal limits.  CMP grossly unremarkable.  Iron  studies within normal limits.  We did check erythropoietin, JAK2 mutation analysis and hemoglobin electrophoresis to rule out other etiologies for polycythemia.  - Follow-up phone call in two weeks to discuss test results  - Tentative in-person follow-up in three months, adjust based on test results  Clinical picture does not suggest primary polycythemia.  Hence goal would be to maintain hematocrit below 55 with therapeutic phlebotomies as needed.  Currently no indication for therapeutic phlebotomy.  Undifferentiated connective tissue disease (HCC) Undifferentiated connective tissue disease, managed with Plaquenil  200 mg twice daily. Reports increased Raynaud's symptoms and numbness in feet,  likely related to the underlying disease. - Continue Plaquenil  200 mg twice daily - She occasionally uses steroids if flares occur -Continue follow-up with rheumatology  Raynauds syndrome Raynaud's syndrome with increased frequency of symptoms, particularly numbness in feet.  She has been on amlodipine  as blood pressure permits.  Lately she has not been taking it.  She is reconsidering to start taking it.    I reviewed lab results and outside records for this visit and discussed relevant results with the patient. Diagnosis, plan of care and treatment options were also discussed in detail with the patient. Opportunity provided to ask questions and answers provided to her apparent satisfaction. Provided instructions to call our clinic with any problems, questions or concerns prior to return visit. I recommended to continue follow-up with PCP and sub-specialists. She verbalized understanding and agreed with the plan. No barriers to learning was detected.  Chinita Patten, MD  04/05/2024 3:01 PM  [facility] - A DEPT OF [facility] [address] [place] [address] Dept: [phone] Dept Fax: [phone]   HISTORY OF PRESENTING ILLNESS:   Discussed the use of AI scribe software for clinical note transcription with the patient, who gave verbal consent to proceed.  History of Present Illness Sharon Hartman is a 43 year old female with mixed connective tissue disease who presents with concerns about elevated hemoglobin and hematocrit levels.  She has a history of mixed connective tissue disease, previously diagnosed as lupus, and is positive for lupus anticoagulant. Her current medications include Plaquenil  200 mg twice daily for five days a week and amlodipine  for Raynaud's phenomenon, although she has not picked up her prescription recently. She occasionally uses steroids during flares but has not needed them recently. She  takes an 81 mg aspirin daily for lupus anticoagulant.  Her hemoglobin was 15.7 and hematocrit was 47 on Feb 29, 2024, which are slightly above the normal range for women. Previously labs in August 2024 showed hemoglobin of 15.3, hematocrit 45.1. She noted difficulty with blood draw despite being well-hydrated.  She has experienced increased flares of Raynaud's phenomenon, with more frequent numbness in her feet. She has also had two recent episodes of epistaxis, which is unusual for her. She experiences dizziness but denies chest congestion, trouble breathing, or lightheadedness.  Her calcium  levels have been slightly elevated, with a reading of 10.3, but she does not take calcium  supplements. She mentions that her calcium  levels have been on the higher side of normal in the past.  She does not smoke and denies any issues with sleep apnea, stating she does not snore. She has not donated blood recently due to typically low blood pressure.     MEDICAL HISTORY:  Past Medical History:  Diagnosis Date   ADD (attention deficit disorder)    Anxiety    Constipation    DDD (degenerative disc disease), lumbar    Depression    Fatty liver    Galactorrhea    bilateral nipple discharge   History of basal cell carcinoma (BCC) excision    back 2017 excision   History of cervical dysplasia    CIN II  s/p LEEP 06/ 2005   History of suicide attempt    2003; 2006   History of syncope    02/ 2015  w/ palpitations and pregnant--- evaluation by cardiology, dr mona, note in epic 03/ 2015, work-up done w/ normal echo and event monitor showed SR with isolated PVCs/ PACs   Hyperlipidemia    Inappropriate sinus tachycardia (HCC)    Low testosterone    Low vitamin D  level    Lupus    Migraine    Numbness    bilateral hands and feet due to raynaurd's disease   Polyarthralgia    PONV (postoperative nausea and vomiting)    also lightweight when it comes to anesthesia   Raynaud's disease     rheumotology--- dr dolphus--  with bilateral hand and feet numbness, and nipples per pt;  takes norvasc    Right lower quadrant pain    Scoliosis    thoracic   Sicca syndrome (HCC)    Tremor, unspecified    intermittant left hand/ wrist tremor with dorsiflexion  ( neurology evaluation by dr tat note in epic 08-15-2019, felt to be clonus)    SURGICAL HISTORY: Past Surgical History:  Procedure Laterality Date   DILATATION & CURETTAGE/HYSTEROSCOPY WITH MYOSURE N/A 08/23/2019   Procedure: DILATATION & CURETTAGE/HYSTEROSCOPY WITH MYOSURE;  Surgeon: Rutherford Gain, MD;  Location: [facility] [place];  Service: Gynecology;  Laterality: N/A;   DILATION AND CURETTAGE OF UTERUS  1999   GANGLION CYST EXCISION Right 2011   foot   LAPAROSCOPIC LYSIS OF ADHESIONS N/A 12/23/2019   Procedure: DIAGNOSTIC LAPAROSCOPY WITH PERITONEAL BIOPSY;  Surgeon: Rutherford Gain, MD;  Location: [facility] [place];  Service: Gynecology;  Laterality: N/A;  2 1/2 hrs.   LEEP  03-31-2004   @[facility]    CIN II   TONSILLECTOMY  1998   WISDOM TOOTH EXTRACTION  1999    SOCIAL HISTORY:  She reports that she quit smoking about 19 years ago. Her smoking use included cigarettes. She started smoking about 26 years ago. She has a 1.4 pack-year smoking history. She has been exposed to tobacco  smoke. She has never used smokeless tobacco. She reports current alcohol use. She reports that she does not use drugs.  Social History   Socioeconomic History   Marital status: Legally Separated    Spouse name: Not on file   Number of children: 1   Years of education: college   Highest education level: Not on file  Occupational History   Occupation: RN  Tobacco Use   Smoking status: Former    Current packs/day: 0.00    Average packs/day: 0.2 packs/day for 7.0 years (1.4 ttl pk-yrs)    Types: Cigarettes    Start date: 08/20/1997    Quit date: 08/20/2004    Years since quitting: 19.6    Passive exposure: Past    Smokeless tobacco: Never  Vaping Use   Vaping status: Never Used  Substance and Sexual Activity   Alcohol use: Yes    Comment: rarely   Drug use: Never   Sexual activity: Yes    Birth control/protection: None  Other Topics Concern   Not on file  Social History Narrative   Lives at home with husband and son.   Right-handed.   No caffeine use.   Social Drivers of Corporate investment banker Strain: Not on file  Food Insecurity: No Food Insecurity (04/05/2024)   Hunger Vital Sign    Worried About Running Out of Food in the Last Year: Never true    Ran Out of Food in the Last Year: Never true  Transportation Needs: No Transportation Needs (04/05/2024)   PRAPARE - Administrator, Civil Service (Medical): No    Lack of Transportation (Non-Medical): No  Physical Activity: Sufficiently Active (10/19/2017)   Exercise Vital Sign    Days of Exercise per Week: 4 days    Minutes of Exercise per Session: 40 min  Stress: Stress Concern Present (10/19/2017)   [employer] of Occupational Health - Occupational Stress Questionnaire    Feeling of Stress : Rather much  Social Connections: Unknown (02/21/2022)   Received from [facility]   Social Network    Social Network: Not on file  Intimate Partner Violence: Not At Risk (04/05/2024)   Humiliation, Afraid, Rape, and Kick questionnaire    Fear of Current or Ex-Partner: No    Emotionally Abused: No    Physically Abused: No    Sexually Abused: No    FAMILY HISTORY: Family History  Problem Relation Age of Onset   Heart murmur Mother    Diabetes Mother    Hepatitis Mother        auto immune    Breast cancer Paternal Grandmother    Hypertension Paternal Grandmother    Hyperlipidemia Paternal Grandmother    Heart disease Paternal Grandmother    Hypertension Paternal Grandfather    Heart disease Paternal Grandfather        CABG   Heart attack Paternal Grandfather        X4   Hyperlipidemia Paternal Grandfather     Hypertension Father    Hyperlipidemia Father    Hashimoto's thyroiditis Sister    Depression Sister    Breast cancer Sister    Anxiety disorder Sister    Scoliosis Brother    Drug abuse Brother    Autism Son     CURRENT MEDICATIONS   Current Outpatient Medications  Medication Instructions   albuterol (VENTOLIN HFA) 108 (90 Base) MCG/ACT inhaler albuterol sulfate HFA 90 mcg/actuation aerosol inhaler   amLODipine  (NORVASC ) 5 MG tablet TAKE 1 TABLET  BY MOUTH EVERYDAY AT BEDTIME   amphetamine-dextroamphetamine (ADDERALL) 10 MG tablet 10 mg, 2 times daily   aspirin EC 81 mg, Daily   buPROPion (WELLBUTRIN SR) 150 mg, Daily   cyclobenzaprine  (FLEXERIL ) 10 mg, 3 times daily PRN   EMGALITY 120 MG/ML SOAJ 1 mL, Every 30 days   hydroxychloroquine  (PLAQUENIL ) 200 MG tablet TAKE 1 TABLET BY MOUTH TWICE DAILY ON MONDAY THROUGH FRIDAY   ibuprofen  (ADVIL ) 800 mg, Oral, Every 8 hours PRN   levonorgestrel  (MIRENA ) 20 MCG/DAY IUD 1 each,  Once   loratadine (CLARITIN) 10 mg, Daily   LORazepam (ATIVAN) 0.5 MG tablet As needed   Multiple Vitamins-Minerals (MULTIVITAMIN ADULT PO) 1 tablet, Daily   nitroGLYCERIN  (NITROGLYN) 2 % ointment Apply 1/4 inch to fingertips three times daily.   propranolol  (INDERAL ) 10 mg, Oral, Every 8 hours PRN   SENNA PO As needed   sertraline (ZOLOFT) 100 mg, Daily     ALLERGIES  She has no known allergies.  REVIEW OF SYSTEMS:    Review of Systems - Oncology  All of the other pertinent systems were reviewed with the patient and were unremarkable except as mentioned above in HPI.  PHYSICAL EXAMINATION:    Onc Performance Status - 04/05/24 1139       ECOG Perf Status   ECOG Perf Status Restricted in physically strenuous activity but ambulatory and able to carry out work of a light or sedentary nature, e.g., light house work, office work      KPS SCALE   KPS % SCORE Able to carry on normal activity, minor s/s of disease          Vitals:   04/05/24 1117   BP: 122/83  Pulse: (!) 111  Resp: 20  Temp: 97.8 F (36.6 C)  SpO2: 100%   Filed Weights   04/05/24 1117  Weight: 131 lb 8 oz (59.6 kg)    Physical Exam Constitutional:      General: She is not in acute distress.    Appearance: Normal appearance. She is well-developed.  HENT:     Head: Normocephalic and atraumatic.     Mouth/Throat:     Mouth: Mucous membranes are moist.   Eyes:     General: No scleral icterus.   Cardiovascular:     Rate and Rhythm: Normal rate and regular rhythm.     Heart sounds: Normal heart sounds.  Pulmonary:     Effort: Pulmonary effort is normal. No respiratory distress.     Breath sounds: Normal breath sounds.  Abdominal:     Palpations: Abdomen is soft. There is no mass.     Tenderness: There is no abdominal tenderness.   Musculoskeletal:     Right lower leg: No edema.     Left lower leg: No edema.   Neurological:     General: No focal deficit present.     Mental Status: She is alert and oriented to person, place, and time.   Psychiatric:        Mood and Affect: Mood normal.        Behavior: Behavior normal.      LABORATORY DATA:   I have reviewed the data as listed.  Results for orders placed or performed in visit on 04/05/24  Iron  and Iron  Binding Capacity ([facility] only)  Result Value Ref Range   Iron  92 28 - 170 ug/dL   TIBC 650 749 - 549 ug/dL   Saturation Ratios 26 10.4 - 31.8 %   UIBC  257 148 - 442 ug/dL  CMP (Cancer Center only)  Result Value Ref Range   Sodium 140 135 - 145 mmol/L   Potassium 3.4 (L) 3.5 - 5.1 mmol/L   Chloride 104 98 - 111 mmol/L   CO2 29 22 - 32 mmol/L   Glucose, Bld 87 70 - 99 mg/dL   BUN 7 6 - 20 mg/dL   Creatinine 9.30 9.55 - 1.00 mg/dL   Calcium  9.8 8.9 - 10.3 mg/dL   Total Protein 7.3 6.5 - 8.1 g/dL   Albumin 4.8 3.5 - 5.0 g/dL   AST 19 15 - 41 U/L   ALT 18 0 - 44 U/L   Alkaline Phosphatase 40 38 - 126 U/L   Total Bilirubin 0.4 0.0 - 1.2 mg/dL   GFR, Estimated >39 >39 mL/min    Anion gap 7 5 - 15  CBC with Differential (Cancer Center Only)  Result Value Ref Range   WBC Count 5.2 4.0 - 10.5 K/uL   RBC 4.45 3.87 - 5.11 MIL/uL   Hemoglobin 13.9 12.0 - 15.0 g/dL   HCT 61.3 63.9 - 53.9 %   MCV 86.7 80.0 - 100.0 fL   MCH 31.2 26.0 - 34.0 pg   MCHC 36.0 30.0 - 36.0 g/dL   RDW 87.8 88.4 - 84.4 %   Platelet Count 278 150 - 400 K/uL   nRBC 0.0 0.0 - 0.2 %   Neutrophils Relative % 41 %   Neutro Abs 2.2 1.7 - 7.7 K/uL   Lymphocytes Relative 51 %   Lymphs Abs 2.6 0.7 - 4.0 K/uL   Monocytes Relative 6 %   Monocytes Absolute 0.3 0.1 - 1.0 K/uL   Eosinophils Relative 1 %   Eosinophils Absolute 0.0 0.0 - 0.5 K/uL   Basophils Relative 1 %   Basophils Absolute 0.1 0.0 - 0.1 K/uL   Immature Granulocytes 0 %   Abs Immature Granulocytes 0.02 0.00 - 0.07 K/uL     RADIOGRAPHIC STUDIES:  No recent pertinent imaging available to review.  Orders Placed This Encounter  Procedures   CBC with Differential (Cancer Center Only)    Standing Status:   Future    Number of Occurrences:   1    Expiration Date:   04/05/2025   CMP (Cancer Center only)    Standing Status:   Future    Number of Occurrences:   1    Expiration Date:   04/05/2025   Iron  and Iron  Binding Capacity ([facility] only)    Standing Status:   Future    Number of Occurrences:   1    Expiration Date:   04/05/2025   Ferritin    Standing Status:   Future    Number of Occurrences:   1    Expiration Date:   04/05/2025   Erythropoietin    Standing Status:   Future    Number of Occurrences:   1    Expiration Date:   04/05/2025   JAK2 V617F rfx CALR/MPL/E12-15    Standing Status:   Future    Number of Occurrences:   1    Expiration Date:   04/05/2025   Hgb Fractionation Cascade    Standing Status:   Future    Number of Occurrences:   1    Expiration Date:   04/05/2025    Future Appointments  Date Time Provider Department Center  04/17/2024  2:40 PM Dolphus Reiter, MD [facility] None  04/24/2024  3:15 PM Sariah Henkin,  Chinita, MD  [facility] None  07/05/2024 10:30 AM [facility] None  07/05/2024 11:00 AM Odilon Cass, MD [facility] None     I spent a total of 40 minutes during this encounter with the patient including review of chart and various tests results, discussions about plan of care and coordination of care plan.   This document was completed utilizing speech recognition software. Grammatical errors, random word insertions, pronoun errors, and incomplete sentences are an occasional consequence of this system due to software limitations, ambient noise, and hardware issues. Any formal questions or concerns about the content, text or information contained within the body of this dictation should be directly addressed to the provider for clarification.

## 2024-04-05 NOTE — Assessment & Plan Note (Addendum)
 Polycythemia with hemoglobin of 15.7 and hematocrit of 47 on 02/29/2024, slightly above normal range.   Differential includes primary polycythemia and secondary causes such as dehydration. No smoking or sleep apnea reported.  Labs today actually showed much improved numbers with hemoglobin of 13.9, hematocrit 38.6.  White count and platelet count are within normal limits.  CMP grossly unremarkable.  Iron  studies within normal limits.  We did check erythropoietin, JAK2 mutation analysis and hemoglobin electrophoresis to rule out other etiologies for polycythemia.  - Follow-up phone call in two weeks to discuss test results  - Tentative in-person follow-up in three months, adjust based on test results  Clinical picture does not suggest primary polycythemia.  Hence goal would be to maintain hematocrit below 55 with therapeutic phlebotomies as needed.  Currently no indication for therapeutic phlebotomy.

## 2024-04-05 NOTE — Assessment & Plan Note (Signed)
 Undifferentiated connective tissue disease, managed with Plaquenil  200 mg twice daily. Reports increased Raynaud's symptoms and numbness in feet, likely related to the underlying disease. - Continue Plaquenil  200 mg twice daily - She occasionally uses steroids if flares occur -Continue follow-up with rheumatology

## 2024-04-05 NOTE — Assessment & Plan Note (Signed)
 Raynaud's syndrome with increased frequency of symptoms, particularly numbness in feet.  She has been on amlodipine  as blood pressure permits.  Lately she has not been taking it.  She is reconsidering to start taking it.

## 2024-04-06 LAB — ERYTHROPOIETIN: Erythropoietin: 5.4 m[IU]/mL (ref 2.6–18.5)

## 2024-04-08 LAB — HGB FRACTIONATION CASCADE
Hgb A2: 2.5 % (ref 1.8–3.2)
Hgb A: 97.5 % (ref 96.4–98.8)
Hgb F: 0 % (ref 0.0–2.0)
Hgb S: 0 %

## 2024-04-09 NOTE — Progress Notes (Deleted)
 Office Visit Note  Patient: Sharon Hartman             Date of Birth: [birth year]           MRN: [id number]             PCP: Claudene Pellet, MD Referring: Claudene Pellet, MD Visit Date: 04/17/2024 Occupation: @GUAROCC @  Subjective:  No chief complaint on file.   History of Present Illness: Sharon Hartman is a 43 y.o. female ***     Activities of Daily Living:  Patient reports morning stiffness for *** {minute/hour:19697}.   Patient {ACTIONS;DENIES/REPORTS:21021675::Denies} nocturnal pain.  Difficulty dressing/grooming: {ACTIONS;DENIES/REPORTS:21021675::Denies} Difficulty climbing stairs: {ACTIONS;DENIES/REPORTS:21021675::Denies} Difficulty getting out of chair: {ACTIONS;DENIES/REPORTS:21021675::Denies} Difficulty using hands for taps, buttons, cutlery, and/or writing: {ACTIONS;DENIES/REPORTS:21021675::Denies}  No Rheumatology ROS completed.   PMFS History:  Patient Active Problem List   Diagnosis Date Noted   Polycythemia 04/05/2024   Undifferentiated connective tissue disease (HCC) 04/05/2024   Raynauds syndrome 04/05/2024   Tremor 09/12/2019   Chronic migraine 09/12/2019   Postpartum care following vaginal delivery (9/9) 06/18/2014   Post-dates pregnancy 06/17/2014   Palpitations 11/22/2013   Syncope 11/22/2013   Orthostatic hypotension 11/22/2013   ADHD (attention deficit hyperactivity disorder) 12/04/2011    Past Medical History:  Diagnosis Date   ADD (attention deficit disorder)    Anxiety    Constipation    DDD (degenerative disc disease), lumbar    Depression    Fatty liver    Galactorrhea    bilateral nipple discharge   History of basal cell carcinoma (BCC) excision    back 2017 excision   History of cervical dysplasia    CIN II  s/p LEEP 06/ 2005   History of suicide attempt    2003; 2006   History of syncope    02/ 2015  w/ palpitations and pregnant--- evaluation by cardiology, dr mona, note in epic 03/ 2015, work-up done w/ normal  echo and event monitor showed SR with isolated PVCs/ PACs   Hyperlipidemia    Inappropriate sinus tachycardia (HCC)    Low testosterone    Low vitamin D  level    Lupus    Migraine    Numbness    bilateral hands and feet due to raynaurd's disease   Polyarthralgia    PONV (postoperative nausea and vomiting)    also lightweight when it comes to anesthesia   Raynaud's disease    rheumotology--- dr dolphus--  with bilateral hand and feet numbness, and nipples per pt;  takes norvasc    Right lower quadrant pain    Scoliosis    thoracic   Sicca syndrome (HCC)    Tremor, unspecified    intermittant left hand/ wrist tremor with dorsiflexion  ( neurology evaluation by dr tat note in epic 08-15-2019, felt to be clonus)    Family History  Problem Relation Age of Onset   Heart murmur Mother    Diabetes Mother    Hepatitis Mother        auto immune    Breast cancer Paternal Grandmother    Hypertension Paternal Grandmother    Hyperlipidemia Paternal Grandmother    Heart disease Paternal Grandmother    Hypertension Paternal Grandfather    Heart disease Paternal Grandfather        CABG   Heart attack Paternal Grandfather        X4   Hyperlipidemia Paternal Grandfather    Hypertension Father    Hyperlipidemia Father    Hashimoto's thyroiditis Sister  Depression Sister    Breast cancer Sister    Anxiety disorder Sister    Scoliosis Brother    Drug abuse Brother    Autism Son    Past Surgical History:  Procedure Laterality Date   DILATATION & CURETTAGE/HYSTEROSCOPY WITH MYOSURE N/A 08/23/2019   Procedure: DILATATION & CURETTAGE/HYSTEROSCOPY WITH MYOSURE;  Surgeon: Rutherford Gain, MD;  Location: [facility];  Service: Gynecology;  Laterality: N/A;   DILATION AND CURETTAGE OF UTERUS  1999   GANGLION CYST EXCISION Right 2011   foot   LAPAROSCOPIC LYSIS OF ADHESIONS N/A 12/23/2019   Procedure: DIAGNOSTIC LAPAROSCOPY WITH PERITONEAL BIOPSY;  Surgeon: Rutherford Gain, MD;  Location: [facility];  Service: Gynecology;  Laterality: N/A;  2 1/2 hrs.   LEEP  03-31-2004   @[facility]    CIN II   TONSILLECTOMY  1998   WISDOM TOOTH EXTRACTION  1999   Social History   Social History Narrative   Lives at home with husband and son.   Right-handed.   No caffeine use.   Immunization History  Administered Date(s) Administered   PFIZER(Purple Top)SARS-COV-2 Vaccination 10/22/2019, 11/12/2019, 05/28/2020   PPD Test 02/13/2012, 11/14/2017, 12/25/2017   Tdap 02/13/2012     Objective: Vital Signs: There were no vitals taken for this visit.   Physical Exam   Musculoskeletal Exam: ***  CDAI Exam: CDAI Score: -- Patient Global: --; Provider Global: -- Swollen: --; Tender: -- Joint Exam 04/17/2024   No joint exam has been documented for this visit   There is currently no information documented on the homunculus. Go to the Rheumatology activity and complete the homunculus joint exam.  Investigation: No additional findings.  Imaging: No results found.  Recent Labs: Lab Results  Component Value Date   WBC 5.2 04/05/2024   HGB 13.9 04/05/2024   PLT 278 04/05/2024   NA 140 04/05/2024   K 3.4 (L) 04/05/2024   CL 104 04/05/2024   CO2 29 04/05/2024   GLUCOSE 87 04/05/2024   BUN 7 04/05/2024   CREATININE 0.69 04/05/2024   BILITOT 0.4 04/05/2024   ALKPHOS 40 04/05/2024   AST 19 04/05/2024   ALT 18 04/05/2024   PROT 7.3 04/05/2024   ALBUMIN 4.8 04/05/2024   CALCIUM  9.8 04/05/2024   GFRAA 127 12/22/2020    Speciality Comments: PLQ Eye Exam 09/05/2023 WNL @ [facility] Follow up in 1 year  Procedures:  No procedures performed Allergies: Patient has no known allergies.   Assessment / Plan:     Visit Diagnoses: Undifferentiated connective tissue disease (HCC)  High risk medication use  Raynaud's disease without gangrene  Sicca syndrome (HCC)  Other fatigue  Vitamin D  deficiency  Trochanteric bursitis of both  hips  Other idiopathic scoliosis, thoracic region  Degeneration of intervertebral disc of lumbar region without discogenic back pain or lower extremity pain  Orthostatic hypotension  History of ADHD  Anxiety and depression  Fatty liver  Essential tremor  Orders: No orders of the defined types were placed in this encounter.  No orders of the defined types were placed in this encounter.   Face-to-face time spent with patient was *** minutes. Greater than 50% of time was spent in counseling and coordination of care.  Follow-Up Instructions: No follow-ups on file.   Waddell CHRISTELLA Craze, PA-C  Note - This record has been created using Dragon software.  Chart creation errors have been sought, but may not always  have been located. Such creation errors do not reflect on  the standard of medical care.

## 2024-04-15 LAB — CALR +MPL + E12-E15  (REFLEX)

## 2024-04-15 LAB — JAK2 V617F RFX CALR/MPL/E12-15

## 2024-04-17 ENCOUNTER — Ambulatory Visit: Payer: BC Managed Care – PPO | Admitting: Rheumatology

## 2024-04-17 DIAGNOSIS — K760 Fatty (change of) liver, not elsewhere classified: Secondary | ICD-10-CM

## 2024-04-17 DIAGNOSIS — M7061 Trochanteric bursitis, right hip: Secondary | ICD-10-CM

## 2024-04-17 DIAGNOSIS — G250 Essential tremor: Secondary | ICD-10-CM

## 2024-04-17 DIAGNOSIS — Z8659 Personal history of other mental and behavioral disorders: Secondary | ICD-10-CM

## 2024-04-17 DIAGNOSIS — I7300 Raynaud's syndrome without gangrene: Secondary | ICD-10-CM

## 2024-04-17 DIAGNOSIS — Z79899 Other long term (current) drug therapy: Secondary | ICD-10-CM

## 2024-04-17 DIAGNOSIS — R5383 Other fatigue: Secondary | ICD-10-CM

## 2024-04-17 DIAGNOSIS — M4124 Other idiopathic scoliosis, thoracic region: Secondary | ICD-10-CM

## 2024-04-17 DIAGNOSIS — M359 Systemic involvement of connective tissue, unspecified: Secondary | ICD-10-CM

## 2024-04-17 DIAGNOSIS — I951 Orthostatic hypotension: Secondary | ICD-10-CM

## 2024-04-17 DIAGNOSIS — M51369 Other intervertebral disc degeneration, lumbar region without mention of lumbar back pain or lower extremity pain: Secondary | ICD-10-CM

## 2024-04-17 DIAGNOSIS — F419 Anxiety disorder, unspecified: Secondary | ICD-10-CM

## 2024-04-17 DIAGNOSIS — E559 Vitamin D deficiency, unspecified: Secondary | ICD-10-CM

## 2024-04-17 DIAGNOSIS — M3500 Sicca syndrome, unspecified: Secondary | ICD-10-CM

## 2024-04-24 ENCOUNTER — Encounter: Payer: Self-pay | Admitting: Oncology

## 2024-04-24 ENCOUNTER — Inpatient Hospital Stay: Attending: Oncology | Admitting: Oncology

## 2024-04-24 DIAGNOSIS — D751 Secondary polycythemia: Secondary | ICD-10-CM

## 2024-04-24 NOTE — Progress Notes (Signed)
 [facility]  HEMATOLOGY-ONCOLOGY ELECTRONIC VISIT PROGRESS NOTE  PATIENT NAME: Sharon Hartman   MR#: [id number] DOB: 05/24/1981  DATE OF SERVICE: 04/24/2024  Patient Care Team: Claudene Pellet, MD as PCP - General (Family Medicine) Tobb, Kardie, DO as PCP - Cardiology (Cardiology) Cheryl Waddell CHRISTELLA, PA-C as Physician Assistant (Physician Assistant) Dolphus Reiter, MD as Consulting Physician (Rheumatology)  I connected with the patient via telephone conference and verified that I am speaking with the correct person using two identifiers. The patient's location is at home and I am providing care from the [facility].  I discussed the limitations, risks, security and privacy concerns of performing an evaluation and management service by e-visits and the availability of in person appointments. I also discussed with the patient that there may be a patient responsible charge related to this service. The patient expressed understanding and agreed to proceed.   ASSESSMENT & PLAN:   AIRIKA Hartman is a 43 y.o. lady with past medical history of undifferentiated connective tissue disease, currently on Plaquenil , Raynaud's disease, dyslipidemia, anxiety, was referred to our clinic in June 2025 for evaluation of polycythemia.  Polycythemia was transient and likely secondary to dehydration.  Secondary polycythemia  Polycythemia with hemoglobin of 15.7 and hematocrit of 47 on 02/29/2024, slightly above normal range.    No smoking or sleep apnea reported.   On her consultation with us  on 04/05/2024, labs actually showed much improved numbers with hemoglobin of 13.9, hematocrit 38.6.  White count and platelet count were within normal limits.  CMP grossly unremarkable.  Iron  studies within normal limits.  Erythropoietin  was also normal.  Hemoglobin electrophoresis showed normal hemoglobin pattern.  JAK2 V617F mutation analysis, followed by reflex testing to include CALR, MPL, exon 12-15  mutations were all negative.  No evidence of primary myeloproliferative neoplasm.    Clinically picture is indicative of transient secondary polycythemia, likely reactionary versus dehydration related. Goal would be to maintain hematocrit below 55 with therapeutic phlebotomies as needed to avoid hyperviscosity related complications.  Currently no indication for therapeutic phlebotomy.  RTC in 3 months for follow-up.  If labs are stable, she can be discharged from our office after next visit.   I discussed the assessment and treatment plan with the patient. The patient was provided an opportunity to ask questions and all were answered. The patient agreed with the plan and demonstrated an understanding of the instructions. The patient was advised to call back or seek an in-person evaluation if the symptoms worsen or if the condition fails to improve as anticipated.    I spent 11 minutes over the phone with the patient reviewing test results, discuss management and coordination/planning of care.  Chinita Patten, MD 04/24/2024 11:25 PM [facility] - [facility] [address] [place] [address] Dept: [phone] Dept Fax: [phone]   INTERVAL HISTORY:  Please see above for problem oriented charting.  The purpose of today's discussion is to explain recent lab results and to formulate plan of care.  Discussed the use of AI scribe software for clinical note transcription with the patient, who gave verbal consent to proceed.  History of Present Illness Sharon Hartman is a 43 year old female who presents for follow-up of her hematological workup results.  She underwent a comprehensive hematological workup on April 05, 2024. Her hemoglobin level was 13.9, and hematocrit was 38.6, both within normal limits. White blood  cell count and platelet count were also normal. Iron  studies and erythropoietin  levels were normal.  Testing for the JAK2 mutation was negative. Hemoglobin pattern analysis showed normal results.  Her potassium level was slightly low at 3.4, with the normal range starting at 3.5. She reports no new symptoms and states 'I can't complain other than the heat.'   SUMMARY OF HEMATOLOGY HISTORY:  She has a history of mixed connective tissue disease, previously diagnosed as lupus, and is positive for lupus anticoagulant. Her current medications include Plaquenil  200 mg twice daily for five days a week and amlodipine  for Raynaud's phenomenon, although she has not picked up her prescription recently. She occasionally uses steroids during flares but has not needed them recently. She takes an 81 mg aspirin daily for lupus anticoagulant.   Her hemoglobin was 15.7 and hematocrit was 47 on Feb 29, 2024, which are slightly above the normal range for women. Previously labs in August 2024 showed hemoglobin of 15.3, hematocrit 45.1. She noted difficulty with blood draw despite being well-hydrated.   She has experienced increased flares of Raynaud's phenomenon, with more frequent numbness in her feet. She has also had two recent episodes of epistaxis, which is unusual for her. She experiences dizziness but denies chest congestion, trouble breathing, or lightheadedness.   Her calcium  levels have been slightly elevated, with a reading of 10.3, but she does not take calcium  supplements. She mentions that her calcium  levels have been on the higher side of normal in the past.   She does not smoke and denies any issues with sleep apnea, stating she does not snore. She has not donated blood recently due to typically low blood pressure.  Polycythemia with hemoglobin of 15.7 and hematocrit of 47 on 02/29/2024, slightly above normal range.    No smoking or sleep apnea reported.   On her consultation with us  on 04/05/2024, labs actually showed much improved numbers with hemoglobin of 13.9, hematocrit 38.6.  White count and  platelet count were within normal limits.  CMP grossly unremarkable.  Iron  studies within normal limits.  Hemoglobin electrophoresis showed normal hemoglobin pattern.  Erythropoietin  was also normal. JAK2 V617F mutation analysis, followed by reflex testing to include CALR, MPL, exon 12-15 mutations were all negative.  No evidence of primary myeloproliferative neoplasm.    Clinically picture is indicative of transient secondary polycythemia, likely reactionary versus dehydration related. Goal would be to maintain hematocrit below 55 with therapeutic phlebotomies as needed to avoid hyperviscosity related complications.  Currently no indication for therapeutic phlebotomy.    REVIEW OF SYSTEMS:    Review of Systems - Oncology  All other pertinent systems were reviewed with the patient and are negative.  I have reviewed the past medical history, past surgical history, social history and family history with the patient and they are unchanged from previous note.  ALLERGIES:  She has no known allergies.  MEDICATIONS:  Current Outpatient Medications  Medication Sig Dispense Refill   albuterol (VENTOLIN HFA) 108 (90 Base) MCG/ACT inhaler albuterol sulfate HFA 90 mcg/actuation aerosol inhaler     amLODipine  (NORVASC ) 5 MG tablet TAKE 1 TABLET BY MOUTH EVERYDAY AT BEDTIME 90 tablet 0   amphetamine-dextroamphetamine (ADDERALL) 10 MG tablet Take 10 mg by mouth 2 (two) times daily.     aspirin EC 81 MG tablet Take 81 mg by mouth daily.     buPROPion (WELLBUTRIN SR) 150 MG 12 hr tablet Take 150 mg by mouth daily.     cyclobenzaprine  (FLEXERIL ) 10 MG tablet  Take 10 mg by mouth 3 (three) times daily as needed for muscle spasms.     EMGALITY 120 MG/ML SOAJ Inject 1 mL into the skin every 30 (thirty) days.     hydroxychloroquine  (PLAQUENIL ) 200 MG tablet TAKE 1 TABLET BY MOUTH TWICE DAILY ON MONDAY THROUGH FRIDAY 120 tablet 0   ibuprofen  (ADVIL ) 800 MG tablet Take 1 tablet (800 mg total) by mouth every 8  (eight) hours as needed for cramping. 30 tablet 5   levonorgestrel  (MIRENA ) 20 MCG/DAY IUD 1 each by Intrauterine route once. 2023     loratadine (CLARITIN) 10 MG tablet Take 10 mg by mouth daily.     LORazepam (ATIVAN) 0.5 MG tablet as needed.     Multiple Vitamins-Minerals (MULTIVITAMIN ADULT PO) Take 1 tablet by mouth daily.      nitroGLYCERIN  (NITROGLYN) 2 % ointment Apply 1/4 inch to fingertips three times daily. 30 g 0   propranolol  (INDERAL ) 10 MG tablet Take 1 tablet (10 mg total) by mouth every 8 (eight) hours as needed (if heart rate is over 150 bpm for over 15 minutes). 150 tablet 3   SENNA PO Take by mouth as needed.     sertraline (ZOLOFT) 100 MG tablet Take 100 mg by mouth daily.     No current facility-administered medications for this visit.    PHYSICAL EXAMINATION:    Onc Performance Status - 04/24/24 2300       ECOG Perf Status   ECOG Perf Status Restricted in physically strenuous activity but ambulatory and able to carry out work of a light or sedentary nature, e.g., light house work, office work      KPS SCALE   KPS % SCORE Able to carry on normal activity, minor s/s of disease          LABORATORY DATA:   I have reviewed the data as listed.  Recent Results (from the past 2160 hours)  C3 and C4     Status: None   Collection Time: 02/29/24 12:05 PM  Result Value Ref Range   C3 Complement 101 83 - 193 mg/dL   C4 Complement 21 15 - 57 mg/dL  Sedimentation rate     Status: None   Collection Time: 02/29/24 12:05 PM  Result Value Ref Range   Sed Rate 2 0 - 20 mm/h  Anti-DNA antibody, double-stranded     Status: None   Collection Time: 02/29/24 12:05 PM  Result Value Ref Range   ds DNA Ab <1 IU/mL    Comment:                            IU/mL       Interpretation                            < or = 4    Negative                            5-9         Indeterminate                            > or = 10   Positive .   Protein / creatinine ratio, urine      Status: Abnormal   Collection Time: 02/29/24 12:05 PM  Result Value  Ref Range   Creatinine, Urine 49 20 - 275 mg/dL   Protein/Creat Ratio 82 24 - 184 mg/g creat   Protein/Creatinine Ratio 0.082 0.024 - 0.184 mg/mg creat   Total Protein, Urine 4 (L) 5 - 24 mg/dL  CBC with Differential/Platelet     Status: Abnormal   Collection Time: 02/29/24 12:05 PM  Result Value Ref Range   WBC 9.0 3.8 - 10.8 Thousand/uL   RBC 5.04 3.80 - 5.10 Million/uL   Hemoglobin 15.7 (H) 11.7 - 15.5 g/dL   HCT 52.9 (H) 64.9 - 54.9 %   MCV 93.3 80.0 - 100.0 fL   MCH 31.2 27.0 - 33.0 pg   MCHC 33.4 32.0 - 36.0 g/dL    Comment: For adults, a slight decrease in the calculated MCHC value (in the range of 30 to 32 g/dL) is most likely not clinically significant; however, it should be interpreted with caution in correlation with other red cell parameters and the patient's clinical condition.    RDW 11.8 11.0 - 15.0 %   Platelets 375 140 - 400 Thousand/uL   MPV 9.3 7.5 - 12.5 fL   Neutro Abs 6,111 1,500 - 7,800 cells/uL   Absolute Lymphocytes 2,421 850 - 3,900 cells/uL   Absolute Monocytes 351 200 - 950 cells/uL   Eosinophils Absolute 81 15 - 500 cells/uL   Basophils Absolute 36 0 - 200 cells/uL   Neutrophils Relative % 67.9 %   Total Lymphocyte 26.9 %   Monocytes Relative 3.9 %   Eosinophils Relative 0.9 %   Basophils Relative 0.4 %  COMPLETE METABOLIC PANEL WITH GFR     Status: Abnormal   Collection Time: 02/29/24 12:05 PM  Result Value Ref Range   Glucose, Bld 96 65 - 99 mg/dL    Comment: .            Fasting reference interval .    BUN 12 7 - 25 mg/dL   Creat 9.31 9.49 - 9.00 mg/dL   BUN/Creatinine Ratio SEE NOTE: 6 - 22 (calc)    Comment:    Not Reported: BUN and Creatinine are within    reference range. .    Sodium 137 135 - 146 mmol/L   Potassium 4.3 3.5 - 5.3 mmol/L   Chloride 100 98 - 110 mmol/L   CO2 28 20 - 32 mmol/L   Calcium  10.3 (H) 8.6 - 10.2 mg/dL   Total Protein 7.8 6.1 - 8.1  g/dL   Albumin 5.2 (H) 3.6 - 5.1 g/dL   Globulin 2.6 1.9 - 3.7 g/dL (calc)   AG Ratio 2.0 1.0 - 2.5 (calc)   Total Bilirubin 0.5 0.2 - 1.2 mg/dL   Alkaline phosphatase (APISO) 32 31 - 125 U/L   AST 19 10 - 30 U/L   ALT 12 6 - 29 U/L  Hgb Fractionation Cascade     Status: None   Collection Time: 04/05/24 12:10 PM  Result Value Ref Range   Hgb F 0.0 0.0 - 2.0 %   Hgb A 97.5 96.4 - 98.8 %   Hgb A2 2.5 1.8 - 3.2 %   Hgb S 0.0 0.0 %   Interpretation, Hgb Fract Comment     Comment: (NOTE) Normal hemoglobin present; no hemoglobin variant or beta thalassemia identified. Note: Alpha thalassemia may not be detected by the Hgb Fractionation Cascade panel. If alpha thalassemia is suspected, [facility] offers Alpha-Thalassemia DNA Analysis [phone]). Performed At: [facility] [address] [place], [place] [id number] Jennette Shorter MD  Ey:[phone]   JAK2 V617F rfx CALR/MPL/E12-15     Status: None   Collection Time: 04/05/24 12:10 PM  Result Value Ref Range   Specimen Type Comment:     Comment: NOT PROVIDED CORRECTED ON 07/07 AT 1136: PREVIOUSLY REPORTED AS [id number]/mpl    JAK2 V617F Result Comment     Comment: (NOTE) NEGATIVE The JAK2 V617F mutation is not detected in the provided specimen of this individual. Results should be interpreted in conjunction with clinical and other laboratory findings for the most accurate interpretation. This test was developed and its performance characteristics determined by [facility]. It has not been cleared or approved by the Food and Drug Administration.    Reflex Comment     Comment: (NOTE) Reflex to CALR Mutation Analysis, JAK2 Exon 12-15 Mutation Analysis, and MPL Mutation Analysis is indicated.    V617F Rfx CALR/MPL/E12-15 Bkgd Comment     Comment: (NOTE)    Molecular testing of blood or bone marrow is useful in the evaluation of suspected myeloproliferative neoplasms (MPN). Mutations in the JAK2, MPL, and CALR genes are present  in virtually all MPNs and their presence help distinguish benign reactive processes from clonal neoplasms. These mutations are generally considered mutually exclusive, although concurrent clones have been reported in rare patients. This test will assess for the JAK2V617F (exon 14) mutation first and will reflex to CALR mutation analysis, MPL mutation analysis, and JAK2 exon 12 to 15 mutation analysis if the JAK2V617F mutation is negative.    The JAK2 (Janus kinase 2) gene encodes for a non-receptor protein tyrosine kinase that activates cytokine and growth factor signaling. The V617F (c.1849 G>T) mutation results in constitutive activation of JAK2 and downstream STAT5 and ERK signaling. The V617F mutation is observed in approximately 95% of polycythemia vera (PV), 60% of essential thromboc ythemia (ET) and primary myelofibrosis (PMF). It is also infrequently present (3-5%) in myelodysplastic syndrome, chronic myelomonocytic leukemia, and other atypical chronic myeloid disorders. A small percentage of JAK2 mutation positive patients (3.3%) contain other non-V617F mutations within exons 12 to 15. In particular, mutations in exon 12 of JAK2 have been described in approximately 3% of patients with PV. JAK2 allele burden correlates with clinical phenotype, with low levels of mutant allele characterized by thrombocytosis, intermediate levels with erythrocytosis, and high mutant allele burden correlating with enhanced myelopoiesis of the BM, leukocytosis, increasing spleen size, and circulating CD34-positive cells.    The CALR (Calreticulin) gene encodes for a multifunctional calcium -binding protein involved in many cellular activities such as growth, proliferation, adhesion, and programmed cell death. Among patients with JAK2 negative MPNs, CALR are found in a pproximately 70% of patients with JAK2-negative essential thrombocythemia (ET) and 60-88% of patients with JAK2-negative primary  myelofibrosis(PMF). Only a minority of patients (approximately 8%) with myelodysplasia have mutations in the CALR gene. CALR mutations are rarely detected in patients with de novo acute myeloid leukemia, chronic myelogenous leukemia, lymphoid leukemia, or solid tumors. CALR mutations are not detected in polycythemia and generally appear to be mutually exclusive with JAK2 mutations and MPL mutations. The majority of mutational changes involve a variety of insertion deletion mutations in exon 9 of the calreticulin gene: approximately 53% of all CALR mutations are a 52 bp deletion (type-1) while the second most prevalent mutation (approximately 32%) contains a 5 bp insertion (type-2). Other mutations (non-type 1 or type 2) are seen in a small minority of cases. CALR mutations in PMF tend to be with a favorable prognosis compared to JAK2 V617F [employer], whereas primary  myelofibrosis negative for CALR, JAK2 V617F and MPL mutations (so-called triple negative) is associated with a poor prognosis and shorter survival.    The MPL (myeloproliferative leukemia virus oncogene) gene encodes the thrombopoietin receptor which regulates hematopoiesis and megakaryopoiesis. Activating MPL mutations are associated with a subset of myeloproliferative neoplasms and acute megakaryoblastic leukemia. MPL W515 mutations are present in approximately 5-8% of patients with primary myelofibrosis (PMF) and 1-4% of patients with essential thrombocythemia (ET). The S505 mutation is detected in patients with hereditary thrombocythemia.    Limitations    This assay has a sensitivity of approximately 1% VAF for JAK2 V617F, 2.5% VAF for other mutations in JAK2 exons 12 to 15, CALR mutations, and MPL mutations. Deletions in JAK2 up to 6 bp and insertions up to 34 bp have been detected in validation studies. Deletions in CALR up to 70 bp and i nsertions up to 12 bp have been detected in validation studies.    Method  based next generation sequencing.     Comment: Comment [employer]    References Comment     Comment: (NOTE) Alghasham N, Alnouri Y, Abalkhail H, Collier RAMAN. Detection of mutations in JAK2 exons 12-15 by [employer] sequencing. Int J Lab Hematol. 2016 Feb;38(1):34-41. doi: 10.1111/ijlh.87574. Epub 2015 Sep 11. PMID: 73638915. Glorine ISLE, Cheryll LABOR, Hasserjian R, Omega PARAS, Borowitz MJ, Ladora Campanile MM, Kingston CD, Cazzola M, Vardiman JW. The 2016 revision to the World Health Organization classification of myeloid neoplasms and acute leukemia. Blood. 2016 May 19;127(20):2391-405. doi: 10.1182/blood-2016-03-643544. Epub 2016 Apr 11. PMID: 72930745. Honor LELON Glennie VEAR Laurita OLEGARIO [facility], Zhang ZJ, [place] S, Albitar M. Mutation profile of JAK2 transcripts in patients with chronic myeloproliferative neoplasias. J Mol Diagn. 2009 Jan;11(1):49-53.doi: 10.2353/jmoldx.2009.080114. Epub 2008 Dec 12. PMID: 80925404; PMCID: EFR7392434. NCCN Clinical Practice Guidelines in Oncology (NCCN Guidelines) Myeloproliferative Neoplasms Version 3.2022 - May 20, 2021. Swerdlow SH, Programmer, multimedia. WHO classif ication of Tumours of Haematopoietic and Lymphoid Tissues. 4th edn. Epifanio, [place]: Geologist, engineering for [employer] on [employer]; 2017. Tefferi A. Primary myelofibrosis: 2021 update on diagnosis, risk-stratification and management. Am J Hematol. 2021 Jan;96(1):145-162. doi: 10.1002/ajh.26050. Epub 2020 Dec 2. PMID: 66802950. Vainchenker W, Kralovics R. Genetic basis and molecular pathophysiology of classical myeloproliferative neoplasms. Blood. 2017 Feb 9;129(6):667-679. doi: 10.1182/blood-2016-10-695940. Epub 2016 Dec 27. PMID: 71971970.    Director Review Comment     Comment: (NOTE) Technical Component performed at [employer] [facility] Professional Component performed by: Rolan Flatten, PhD, [facility] Director, Molecular Oncology [facility] [id number], [address] [place] [phone] Performed  At: [facility] [address] [place], [place] [id number] Loran Gales MDPhD Ey:[id number] Performed At: [facility] [address] [place], [place] [id number] Loran Gales MDPhD Ey:[id number]   Erythropoietin      Status: None   Collection Time: 04/05/24 12:10 PM  Result Value Ref Range   Erythropoietin  5.4 2.6 - 18.5 mIU/mL    Comment: (NOTE) [employer] Uni[employer]el DxI 800 Immunoassay System Values obtained with different assay methods or kits cannot be used interchangeably. Results cannot be interpreted as absolute evidence of the presence or absence of malignant disease. Performed At: [facility] [address] [place], [place] [id number] Jennette Shorter MD Ey:[phone]   Iron  and Iron  Binding Capacity ([facility] only)     Status: None   Collection Time: 04/05/24 12:10 PM  Result Value Ref Range   Iron  92 28 - 170 ug/dL   TIBC 650 749 - 549 ug/dL   Saturation  Ratios 26 10.4 - 31.8 %   UIBC 257 148 - 442 ug/dL    Comment: Performed at [facility], [address], [place], [place] [address]  CMP (Cancer Center only)     Status: Abnormal   Collection Time: 04/05/24 12:10 PM  Result Value Ref Range   Sodium 140 135 - 145 mmol/L   Potassium 3.4 (L) 3.5 - 5.1 mmol/L   Chloride 104 98 - 111 mmol/L   CO2 29 22 - 32 mmol/L   Glucose, Bld 87 70 - 99 mg/dL    Comment: Glucose reference range applies only to samples taken after fasting for at least 8 hours.   BUN 7 6 - 20 mg/dL   Creatinine 9.30 9.55 - 1.00 mg/dL   Calcium  9.8 8.9 - 10.3 mg/dL   Total Protein 7.3 6.5 - 8.1 g/dL   Albumin 4.8 3.5 - 5.0 g/dL   AST 19 15 - 41 U/L   ALT 18 0 - 44 U/L   Alkaline Phosphatase 40 38 - 126 U/L   Total Bilirubin 0.4 0.0 - 1.2 mg/dL   GFR, Estimated >39 >39 mL/min    Comment: (NOTE) Calculated using the CKD-EPI Creatinine Equation (2021)    Anion gap 7 5 - 15    Comment: Performed at [facility], [address]  [place], [place] [address]  CBC with Differential (Cancer Center Only)     Status: None   Collection Time: 04/05/24 12:10 PM  Result Value Ref Range   WBC Count 5.2 4.0 - 10.5 K/uL   RBC 4.45 3.87 - 5.11 MIL/uL   Hemoglobin 13.9 12.0 - 15.0 g/dL   HCT 61.3 63.9 - 53.9 %   MCV 86.7 80.0 - 100.0 fL   MCH 31.2 26.0 - 34.0 pg   MCHC 36.0 30.0 - 36.0 g/dL   RDW 87.8 88.4 - 84.4 %   Platelet Count 278 150 - 400 K/uL   nRBC 0.0 0.0 - 0.2 %   Neutrophils Relative % 41 %   Neutro Abs 2.2 1.7 - 7.7 K/uL   Lymphocytes Relative 51 %   Lymphs Abs 2.6 0.7 - 4.0 K/uL   Monocytes Relative 6 %   Monocytes Absolute 0.3 0.1 - 1.0 K/uL   Eosinophils Relative 1 %   Eosinophils Absolute 0.0 0.0 - 0.5 K/uL   Basophils Relative 1 %   Basophils Absolute 0.1 0.0 - 0.1 K/uL   Immature Granulocytes 0 %   Abs Immature Granulocytes 0.02 0.00 - 0.07 K/uL    Comment: Performed at [facility], [address] [place], [place] [address]  CALR +MPL + E12-E15 (reflexed)     Status: None   Collection Time: 04/05/24 12:10 PM  Result Value Ref Range   CALR Result Comment     Comment: (NOTE) NEGATIVE No insertions or deletions were detected within the analyzed region of the calreticulin (CALR) gene. A negative result does not entirely exclude the possibility of a clonal population carrying CALR gene mutations that are not covered by this assay. Results should be interpreted in conjunction with clinical and laboratory findings for the most accurate interpretation.    MPL Result Comment     Comment: (NOTE) NEGATIVE No MPL mutation was identified in the provided specimen of this individual. Results should be interpreted in conjunction with clinical and other laboratory findings for the most accurate interpretation.    E12-15 Result Comment     Comment: (NOTE)  NEGATIVE    JAK2 mutations were not detected in exons 12, 13, 14 and 15. The G to T nucleotide change encoding the V617F mutation  was not detected. This result does not rule out the presence of JAK2 mutation at a level below the detection sensitivity of this assay, the presence of other mutations outside the analyzed region of the JAK2 gene, or the presence of a myeloproliferative or other neoplasm. Result must be correlated with other clinical data for the most accurate diagnosis. Performed At: [facility] [address] [place], [place] [id number] Loran Gales MDPhD Ey:[id number]   Ferritin     Status: None   Collection Time: 04/05/24 12:12 PM  Result Value Ref Range   Ferritin 85 11 - 307 ng/mL    Comment: Performed at [facility], [address], [place], [place] [address]     RADIOGRAPHIC STUDIES:  No recent pertinent imaging studies available to review.  Orders Placed This Encounter  Procedures   CBC with Differential (Cancer Center Only)    Standing Status:   Future    Expected Date:   07/05/2024    Expiration Date:   10/03/2024     Future Appointments  Date Time Provider Department Center  07/05/2024 10:30 AM [facility] None  07/05/2024 11:00 AM Edythe Riches, [place], MD [facility] None    This document was completed utilizing speech recognition software. Grammatical errors, random word insertions, pronoun errors, and incomplete sentences are an occasional consequence of this system due to software limitations, ambient noise, and hardware issues. Any formal questions or concerns about the content, text or information contained within the body of this dictation should be directly addressed to the provider for clarification.

## 2024-04-24 NOTE — Assessment & Plan Note (Addendum)
  Polycythemia with hemoglobin of 15.7 and hematocrit of 47 on 02/29/2024, slightly above normal range.    No smoking or sleep apnea reported.   On her consultation with us  on 04/05/2024, labs actually showed much improved numbers with hemoglobin of 13.9, hematocrit 38.6.  White count and platelet count were within normal limits.  CMP grossly unremarkable.  Iron  studies within normal limits.  Erythropoietin  was also normal.  Hemoglobin electrophoresis showed normal hemoglobin pattern.  JAK2 V617F mutation analysis, followed by reflex testing to include CALR, MPL, exon 12-15 mutations were all negative.  No evidence of primary myeloproliferative neoplasm.    Clinically picture is indicative of transient secondary polycythemia, likely reactionary versus dehydration related. Goal would be to maintain hematocrit below 55 with therapeutic phlebotomies as needed to avoid hyperviscosity related complications.  Currently no indication for therapeutic phlebotomy.  RTC in 3 months for follow-up.  If labs are stable, she can be discharged from our office after next visit.

## 2024-07-03 ENCOUNTER — Other Ambulatory Visit: Payer: Self-pay

## 2024-07-03 MED ORDER — HYDROXYCHLOROQUINE SULFATE 200 MG PO TABS: ORAL_TABLET | ORAL | 0 refills | Status: DC

## 2024-07-03 NOTE — Telephone Encounter (Signed)
 Refill request received via fax from [facility] for Plaquenil   Last Fill: 03/22/2024  Eye exam: 09/05/2023 WNL   Labs: 04/05/2024 Potassium 3.4  Next Visit: Due 04/02/2024. Message sent to the front to schedule.   Last Visit: 11/03/2023  IK:Lwipqqzmzwupjuzi connective tissue disease (HCC)   Current Dose per office note 11/03/2023: Plaquenil  200 mg 1 tablet by mouth twice daily M-F only   Okay to refill Plaquenil ?

## 2024-07-03 NOTE — Telephone Encounter (Signed)
 Please schedule patient a follow up visit. Patient due 04/02/2024. Thanks!

## 2024-07-05 ENCOUNTER — Inpatient Hospital Stay: Admitting: Oncology

## 2024-07-05 ENCOUNTER — Inpatient Hospital Stay: Attending: Oncology

## 2024-07-08 NOTE — Progress Notes (Deleted)
 Office Visit Note  Patient: Sharon Hartman             Date of Birth: May 31, 1981           MRN: [id number]             PCP: Claudene Pellet, MD Referring: Claudene Pellet, MD Visit Date: 07/10/2024 Occupation: Data Unavailable  Subjective:  No chief complaint on file.   History of Present Illness: Sharon Hartman is a 43 y.o. female ***     Activities of Daily Living:  Patient reports morning stiffness for *** {minute/hour:19697}.   Patient {ACTIONS;DENIES/REPORTS:21021675::Denies} nocturnal pain.  Difficulty dressing/grooming: {ACTIONS;DENIES/REPORTS:21021675::Denies} Difficulty climbing stairs: {ACTIONS;DENIES/REPORTS:21021675::Denies} Difficulty getting out of chair: {ACTIONS;DENIES/REPORTS:21021675::Denies} Difficulty using hands for taps, buttons, cutlery, and/or writing: {ACTIONS;DENIES/REPORTS:21021675::Denies}  No Rheumatology ROS completed.   PMFS History:  Patient Active Problem List   Diagnosis Date Noted   Secondary polycythemia 04/05/2024   Undifferentiated connective tissue disease 04/05/2024   Raynauds syndrome 04/05/2024   Tremor 09/12/2019   Chronic migraine 09/12/2019   Postpartum care following vaginal delivery (9/9) 06/18/2014   Post-dates pregnancy 06/17/2014   Palpitations 11/22/2013   Syncope 11/22/2013   Orthostatic hypotension 11/22/2013   ADHD (attention deficit hyperactivity disorder) 12/04/2011    Past Medical History:  Diagnosis Date   ADD (attention deficit disorder)    Anxiety    Constipation    DDD (degenerative disc disease), lumbar    Depression    Fatty liver    Galactorrhea    bilateral nipple discharge   History of basal cell carcinoma (BCC) excision    back 2017 excision   History of cervical dysplasia    CIN II  s/p LEEP 06/ 2005   History of suicide attempt    2003; 2006   History of syncope    02/ 2015  w/ palpitations and pregnant--- evaluation by cardiology, dr mona, note in epic 03/ 2015, work-up done  w/ normal echo and event monitor showed SR with isolated PVCs/ PACs   Hyperlipidemia    Inappropriate sinus tachycardia    Low testosterone    Low vitamin D  level    Lupus    Migraine    Numbness    bilateral hands and feet due to raynaurd's disease   Polyarthralgia    PONV (postoperative nausea and vomiting)    also lightweight when it comes to anesthesia   Raynaud's disease    rheumotology--- dr dolphus--  with bilateral hand and feet numbness, and nipples per pt;  takes norvasc    Right lower quadrant pain    Scoliosis    thoracic   Sicca syndrome    Tremor, unspecified    intermittant left hand/ wrist tremor with dorsiflexion  ( neurology evaluation by dr tat note in epic 08-15-2019, felt to be clonus)    Family History  Problem Relation Age of Onset   Heart murmur Mother    Diabetes Mother    Hepatitis Mother        auto immune    Breast cancer Paternal Grandmother    Hypertension Paternal Grandmother    Hyperlipidemia Paternal Grandmother    Heart disease Paternal Grandmother    Hypertension Paternal Grandfather    Heart disease Paternal Grandfather        CABG   Heart attack Paternal Grandfather        X4   Hyperlipidemia Paternal Grandfather    Hypertension Father    Hyperlipidemia Father    Hashimoto's thyroiditis Sister  Depression Sister    Breast cancer Sister    Anxiety disorder Sister    Scoliosis Brother    Drug abuse Brother    Autism Son    Past Surgical History:  Procedure Laterality Date   DILATATION & CURETTAGE/HYSTEROSCOPY WITH MYOSURE N/A 08/23/2019   Procedure: DILATATION & CURETTAGE/HYSTEROSCOPY WITH MYOSURE;  Surgeon: Rutherford Gain, MD;  Location: [facility];  Service: Gynecology;  Laterality: N/A;   DILATION AND CURETTAGE OF UTERUS  1999   GANGLION CYST EXCISION Right 2011   foot   LAPAROSCOPIC LYSIS OF ADHESIONS N/A 12/23/2019   Procedure: DIAGNOSTIC LAPAROSCOPY WITH PERITONEAL BIOPSY;  Surgeon: Rutherford Gain, MD;  Location: [facility];  Service: Gynecology;  Laterality: N/A;  2 1/2 hrs.   LEEP  03-31-2004   @[facility]    CIN II   TONSILLECTOMY  1998   WISDOM TOOTH EXTRACTION  1999   Social History   Tobacco Use   Smoking status: Former    Current packs/day: 0.00    Average packs/day: 0.2 packs/day for 7.0 years (1.4 ttl pk-yrs)    Types: Cigarettes    Start date: 08/20/1997    Quit date: 08/20/2004    Years since quitting: 19.8    Passive exposure: Past   Smokeless tobacco: Never  Vaping Use   Vaping status: Never Used  Substance Use Topics   Alcohol use: Yes    Comment: rarely   Drug use: Never   Social History   Social History Narrative   Lives at home with husband and son.   Right-handed.   No caffeine use.     Immunization History  Administered Date(s) Administered   PFIZER(Purple Top)SARS-COV-2 Vaccination 10/22/2019, 11/12/2019, 05/28/2020   PPD Test 02/13/2012, 11/14/2017, 12/25/2017   Tdap 02/13/2012     Objective: Vital Signs: There were no vitals taken for this visit.   Physical Exam   Musculoskeletal Exam: ***  CDAI Exam: CDAI Score: -- Patient Global: --; Provider Global: -- Swollen: --; Tender: -- Joint Exam 07/10/2024   No joint exam has been documented for this visit   There is currently no information documented on the homunculus. Go to the Rheumatology activity and complete the homunculus joint exam.  Investigation: No additional findings.  Imaging: No results found.  Recent Labs: Lab Results  Component Value Date   WBC 5.2 04/05/2024   HGB 13.9 04/05/2024   PLT 278 04/05/2024   NA 140 04/05/2024   K 3.4 (L) 04/05/2024   CL 104 04/05/2024   CO2 29 04/05/2024   GLUCOSE 87 04/05/2024   BUN 7 04/05/2024   CREATININE 0.69 04/05/2024   BILITOT 0.4 04/05/2024   ALKPHOS 40 04/05/2024   AST 19 04/05/2024   ALT 18 04/05/2024   PROT 7.3 04/05/2024   ALBUMIN 4.8 04/05/2024   CALCIUM  9.8 04/05/2024   GFRAA 127  12/22/2020    Speciality Comments: PLQ Eye Exam 09/05/2023 WNL @ [facility] Follow up in 1 year  Procedures:  No procedures performed Allergies: Patient has no known allergies.   Assessment / Plan:     Visit Diagnoses: No diagnosis found.  Orders: No orders of the defined types were placed in this encounter.  No orders of the defined types were placed in this encounter.   Face-to-face time spent with patient was *** minutes. Greater than 50% of time was spent in counseling and coordination of care.  Follow-Up Instructions: No follow-ups on file.   Daved JAYSON Gavel, CMA  Note - This  record has been created using [employer].  Chart creation errors have been sought, but may not always  have been located. Such creation errors do not reflect on  the standard of medical care.

## 2024-07-10 ENCOUNTER — Ambulatory Visit: Payer: Self-pay | Admitting: Rheumatology

## 2024-07-10 DIAGNOSIS — Z8659 Personal history of other mental and behavioral disorders: Secondary | ICD-10-CM

## 2024-07-10 DIAGNOSIS — K760 Fatty (change of) liver, not elsewhere classified: Secondary | ICD-10-CM

## 2024-07-10 DIAGNOSIS — E559 Vitamin D deficiency, unspecified: Secondary | ICD-10-CM

## 2024-07-10 DIAGNOSIS — G250 Essential tremor: Secondary | ICD-10-CM

## 2024-07-10 DIAGNOSIS — M4124 Other idiopathic scoliosis, thoracic region: Secondary | ICD-10-CM

## 2024-07-10 DIAGNOSIS — Z79899 Other long term (current) drug therapy: Secondary | ICD-10-CM

## 2024-07-10 DIAGNOSIS — F419 Anxiety disorder, unspecified: Secondary | ICD-10-CM

## 2024-07-10 DIAGNOSIS — M3500 Sicca syndrome, unspecified: Secondary | ICD-10-CM

## 2024-07-10 DIAGNOSIS — M359 Systemic involvement of connective tissue, unspecified: Secondary | ICD-10-CM

## 2024-07-10 DIAGNOSIS — I7300 Raynaud's syndrome without gangrene: Secondary | ICD-10-CM

## 2024-07-10 DIAGNOSIS — I951 Orthostatic hypotension: Secondary | ICD-10-CM

## 2024-07-10 DIAGNOSIS — M7061 Trochanteric bursitis, right hip: Secondary | ICD-10-CM

## 2024-07-10 DIAGNOSIS — R5383 Other fatigue: Secondary | ICD-10-CM

## 2024-07-10 DIAGNOSIS — M51369 Other intervertebral disc degeneration, lumbar region without mention of lumbar back pain or lower extremity pain: Secondary | ICD-10-CM

## 2024-07-10 NOTE — Progress Notes (Unsigned)
 Office Visit Note  Patient: Sharon Hartman             Date of Birth: [birth year]           MRN: [id number]             PCP: Claudene Pellet, MD Referring: Claudene Pellet, MD Visit Date: 07/11/2024 Occupation: Data Unavailable  Subjective:  No chief complaint on file.   History of Present Illness: Sharon Hartman is a 43 y.o. female ***with undifferentiated connective tissue disease.  She returns today after her last visit in January 2025.    Activities of Daily Living:  Patient reports morning stiffness for *** {minute/hour:19697}.   Patient {ACTIONS;DENIES/REPORTS:21021675::Denies} nocturnal pain.  Difficulty dressing/grooming: {ACTIONS;DENIES/REPORTS:21021675::Denies} Difficulty climbing stairs: {ACTIONS;DENIES/REPORTS:21021675::Denies} Difficulty getting out of chair: {ACTIONS;DENIES/REPORTS:21021675::Denies} Difficulty using hands for taps, buttons, cutlery, and/or writing: {ACTIONS;DENIES/REPORTS:21021675::Denies}  No Rheumatology ROS completed.   PMFS History:  Patient Active Problem List   Diagnosis Date Noted   Secondary polycythemia 04/05/2024   Undifferentiated connective tissue disease 04/05/2024   Raynauds syndrome 04/05/2024   Tremor 09/12/2019   Chronic migraine 09/12/2019   Postpartum care following vaginal delivery (9/9) 06/18/2014   Post-dates pregnancy 06/17/2014   Palpitations 11/22/2013   Syncope 11/22/2013   Orthostatic hypotension 11/22/2013   ADHD (attention deficit hyperactivity disorder) 12/04/2011    Past Medical History:  Diagnosis Date   ADD (attention deficit disorder)    Anxiety    Constipation    DDD (degenerative disc disease), lumbar    Depression    Fatty liver    Galactorrhea    bilateral nipple discharge   History of basal cell carcinoma (BCC) excision    back 2017 excision   History of cervical dysplasia    CIN II  s/p LEEP 06/ 2005   History of suicide attempt    2003; 2006   History of syncope    02/ 2015   w/ palpitations and pregnant--- evaluation by cardiology, dr mona, note in epic 03/ 2015, work-up done w/ normal echo and event monitor showed SR with isolated PVCs/ PACs   Hyperlipidemia    Inappropriate sinus tachycardia    Low testosterone    Low vitamin D  level    Lupus    Migraine    Numbness    bilateral hands and feet due to raynaurd's disease   Polyarthralgia    PONV (postoperative nausea and vomiting)    also lightweight when it comes to anesthesia   Raynaud's disease    rheumotology--- dr dolphus--  with bilateral hand and feet numbness, and nipples per pt;  takes norvasc    Right lower quadrant pain    Scoliosis    thoracic   Sicca syndrome    Tremor, unspecified    intermittant left hand/ wrist tremor with dorsiflexion  ( neurology evaluation by dr tat note in epic 08-15-2019, felt to be clonus)    Family History  Problem Relation Age of Onset   Heart murmur Mother    Diabetes Mother    Hepatitis Mother        auto immune    Breast cancer Paternal Grandmother    Hypertension Paternal Grandmother    Hyperlipidemia Paternal Grandmother    Heart disease Paternal Grandmother    Hypertension Paternal Grandfather    Heart disease Paternal Grandfather        CABG   Heart attack Paternal Grandfather        X4   Hyperlipidemia Paternal Grandfather    Hypertension  Father    Hyperlipidemia Father    Hashimoto's thyroiditis Sister    Depression Sister    Breast cancer Sister    Anxiety disorder Sister    Scoliosis Brother    Drug abuse Brother    Autism Son    Past Surgical History:  Procedure Laterality Date   DILATATION & CURETTAGE/HYSTEROSCOPY WITH MYOSURE N/A 08/23/2019   Procedure: DILATATION & CURETTAGE/HYSTEROSCOPY WITH MYOSURE;  Surgeon: Rutherford Gain, MD;  Location: [facility];  Service: Gynecology;  Laterality: N/A;   DILATION AND CURETTAGE OF UTERUS  1999   GANGLION CYST EXCISION Right 2011   foot   LAPAROSCOPIC LYSIS OF  ADHESIONS N/A 12/23/2019   Procedure: DIAGNOSTIC LAPAROSCOPY WITH PERITONEAL BIOPSY;  Surgeon: Rutherford Gain, MD;  Location: [facility];  Service: Gynecology;  Laterality: N/A;  2 1/2 hrs.   LEEP  03-31-2004   @[facility]    CIN II   TONSILLECTOMY  1998   WISDOM TOOTH EXTRACTION  1999   Social History   Tobacco Use   Smoking status: Former    Current packs/day: 0.00    Average packs/day: 0.2 packs/day for 7.0 years (1.4 ttl pk-yrs)    Types: Cigarettes    Start date: 08/20/1997    Quit date: 08/20/2004    Years since quitting: 19.9    Passive exposure: Past   Smokeless tobacco: Never  Vaping Use   Vaping status: Never Used  Substance Use Topics   Alcohol use: Yes    Comment: rarely   Drug use: Never   Social History   Social History Narrative   Lives at home with husband and son.   Right-handed.   No caffeine use.     Immunization History  Administered Date(s) Administered   PFIZER(Purple Top)SARS-COV-2 Vaccination 10/22/2019, 11/12/2019, 05/28/2020   PPD Test 02/13/2012, 11/14/2017, 12/25/2017   Tdap 02/13/2012     Objective: Vital Signs: There were no vitals taken for this visit.   Physical Exam   Musculoskeletal Exam: ***  CDAI Exam: CDAI Score: -- Patient Global: --; Provider Global: -- Swollen: --; Tender: -- Joint Exam 07/11/2024   No joint exam has been documented for this visit   There is currently no information documented on the homunculus. Go to the Rheumatology activity and complete the homunculus joint exam.  Investigation: No additional findings.  Imaging: No results found.  Recent Labs: Lab Results  Component Value Date   WBC 5.2 04/05/2024   HGB 13.9 04/05/2024   PLT 278 04/05/2024   NA 140 04/05/2024   K 3.4 (L) 04/05/2024   CL 104 04/05/2024   CO2 29 04/05/2024   GLUCOSE 87 04/05/2024   BUN 7 04/05/2024   CREATININE 0.69 04/05/2024   BILITOT 0.4 04/05/2024   ALKPHOS 40 04/05/2024   AST 19 04/05/2024   ALT  18 04/05/2024   PROT 7.3 04/05/2024   ALBUMIN 4.8 04/05/2024   CALCIUM  9.8 04/05/2024   GFRAA 127 12/22/2020   Feb 29, 2024 urine protein creatinine ratio normal, C3-C4 normal, sed rate 2, dsDNA negative April 05, 2024 erythropoietin  normal, ferritin normal  Speciality Comments: PLQ Eye Exam 09/05/2023 WNL @ [facility] Follow up in 1 year  Procedures:  No procedures performed Allergies: Patient has no known allergies.   Assessment / Plan:     Visit Diagnoses: No diagnosis found.  Orders: No orders of the defined types were placed in this encounter.  No orders of the defined types were placed in this encounter.   Face-to-face  time spent with patient was *** minutes. Greater than 50% of time was spent in counseling and coordination of care.  Follow-Up Instructions: No follow-ups on file.   Maya Nash, MD  Note - This record has been created using [employer].  Chart creation errors have been sought, but may not always  have been located. Such creation errors do not reflect on  the standard of medical care.

## 2024-07-11 ENCOUNTER — Encounter: Payer: Self-pay | Admitting: Rheumatology

## 2024-07-11 ENCOUNTER — Ambulatory Visit: Payer: Self-pay | Attending: Rheumatology | Admitting: Rheumatology

## 2024-07-11 VITALS — BP 110/72 | HR 99 | Temp 97.7°F | Resp 14 | Ht 66.0 in | Wt 131.0 lb

## 2024-07-11 DIAGNOSIS — I7300 Raynaud's syndrome without gangrene: Secondary | ICD-10-CM | POA: Diagnosis not present

## 2024-07-11 DIAGNOSIS — Z8659 Personal history of other mental and behavioral disorders: Secondary | ICD-10-CM

## 2024-07-11 DIAGNOSIS — I951 Orthostatic hypotension: Secondary | ICD-10-CM

## 2024-07-11 DIAGNOSIS — E559 Vitamin D deficiency, unspecified: Secondary | ICD-10-CM

## 2024-07-11 DIAGNOSIS — K760 Fatty (change of) liver, not elsewhere classified: Secondary | ICD-10-CM

## 2024-07-11 DIAGNOSIS — F419 Anxiety disorder, unspecified: Secondary | ICD-10-CM

## 2024-07-11 DIAGNOSIS — M7061 Trochanteric bursitis, right hip: Secondary | ICD-10-CM

## 2024-07-11 DIAGNOSIS — R5383 Other fatigue: Secondary | ICD-10-CM

## 2024-07-11 DIAGNOSIS — M359 Systemic involvement of connective tissue, unspecified: Secondary | ICD-10-CM

## 2024-07-11 DIAGNOSIS — M51369 Other intervertebral disc degeneration, lumbar region without mention of lumbar back pain or lower extremity pain: Secondary | ICD-10-CM

## 2024-07-11 DIAGNOSIS — M4124 Other idiopathic scoliosis, thoracic region: Secondary | ICD-10-CM

## 2024-07-11 DIAGNOSIS — M3500 Sicca syndrome, unspecified: Secondary | ICD-10-CM | POA: Diagnosis not present

## 2024-07-11 DIAGNOSIS — F32A Depression, unspecified: Secondary | ICD-10-CM

## 2024-07-11 DIAGNOSIS — Z79899 Other long term (current) drug therapy: Secondary | ICD-10-CM

## 2024-07-11 DIAGNOSIS — M7062 Trochanteric bursitis, left hip: Secondary | ICD-10-CM

## 2024-07-11 DIAGNOSIS — G250 Essential tremor: Secondary | ICD-10-CM

## 2024-07-11 DIAGNOSIS — U071 COVID-19: Secondary | ICD-10-CM

## 2024-07-11 MED ORDER — PILOCARPINE HCL 5 MG PO TABS: 5.0000 mg | ORAL_TABLET | Freq: Two times a day (BID) | ORAL | 2 refills | Status: AC

## 2024-07-11 MED ORDER — NITROGLYCERIN 2 % TD OINT: TOPICAL_OINTMENT | TRANSDERMAL | 0 refills | Status: AC

## 2024-07-11 NOTE — Patient Instructions (Signed)
Pilocarpine Tablets What is this medication? PILOCARPINE (PYE loe KAR peen) treats dry mouth. It works by increasing the amount of saliva in the mouth, which makes it easier to speak and swallow. This medicine may be used for other purposes; ask your health care provider or pharmacist if you have questions. COMMON BRAND NAME(S): Salagen What should I tell my care team before I take this medication? They need to know if you have any of these conditions: Eye infection or other eye problems Glaucoma Heart disease Liver disease Lung or breathing disease, such as asthma An unusual or allergic reaction to pilocarpine, other medications, foods, dyes, or preservatives Pregnant or trying to get pregnant Breast-feeding How should I use this medication? Take this medication by mouth with a full glass of water. Take it as directed on the prescription label at the same time every day. Keep taking it unless your care team tells you to stop. Talk to your care team about the use of this medication in children. Special care may be needed. Overdosage: If you think you have taken too much of this medicine contact a poison control center or emergency room at once. NOTE: This medicine is only for you. Do not share this medicine with others. What if I miss a dose? If you miss a dose, take it as soon as you can. If it is almost time for your next dose, take only that dose. Do not take double or extra doses. What may interact with this medication? Antihistamines for allergy, cough, and cold Atropine Certain medications for Alzheimer disease, such as donepezil, galantamine, rivastigmine Certain medications for bladder problems, such as bethanechol, oxybutynin, tolterodine Certain medications for Parkinson disease, such as benztropine, trihexyphenidyl Certain medications for quitting smoking, such as nicotine Certain medications for stomach problems, such as dicyclomine, hyoscyamine Certain medications for  travel sickness, such as scopolamine Ipratropium Medications for blood pressure or heart problems, such as metoprolol This list may not describe all possible interactions. Give your health care provider a list of all the medicines, herbs, non-prescription drugs, or dietary supplements you use. Also tell them if you smoke, drink alcohol, or use illegal drugs. Some items may interact with your medicine. What should I watch for while using this medication? Visit your care team for regular checks on your progress. Tell your care team if your symptoms do not get better or if they get worse. You may get blurry vision or have trouble telling how far something is from you. This may be a problem at night or when the lights are low. Do not drive, use machinery, or do anything that needs clear vision until you know how this medication affects you. If you sweat a lot, drink enough to replace fluids. Do not get dehydrated. What side effects may I notice from receiving this medication? Side effects that you should report to your care team as soon as possible: Allergic reactions--skin rash, itching, hives, swelling of the face, lips, tongue, or throat Fast or irregular heartbeat Increase in blood pressure Low blood pressure--dizziness, feeling faint or lightheaded, blurry vision Slow heartbeat--dizziness, feeling faint or lightheaded, confusion, trouble breathing, unusual weakness or fatigue Side effects that usually do not require medical attention (report to your care team if they continue or are bothersome): Change in vision Chills Diarrhea Excessive sweating Flushing Headache Increased need to urinate This list may not describe all possible side effects. Call your doctor for medical advice about side effects. You may report side effects to FDA at 1-800-FDA-1088.   Where should I keep my medication? Keep out of the reach of children and pets. Store at room temperature between 15 and 30 degrees C (59 and  86 degrees F). Get rid of any unused medication after the expiration date. To get rid of medications that are no longer needed or have expired: Take the medications to a medication take-back program. Check with your pharmacy or law enforcement to find a location. If your cannot return the medication, check the label or package insert to see if the medication should be thrown out in the garbage or flushed down the toilet. If you are not sure, ask your care team. If it is safe to put it in the trash, take the medication out of the container. Mix the medication with cat litter, dirt, coffee grounds, or other unwanted substance. Seal the mixture in a bag or container. Put it in the trash. NOTE: This sheet is a summary. It may not cover all possible information. If you have questions about this medicine, talk to your doctor, pharmacist, or health care provider.  2024 Elsevier/Gold Standard (2021-09-10 00:00:00)  

## 2024-07-12 ENCOUNTER — Ambulatory Visit: Payer: Self-pay | Admitting: Rheumatology

## 2024-07-12 NOTE — Progress Notes (Signed)
 Urine protein creatinine ratio normal, CBC normal, potassium is low at 3.2, sed rate normal.  Rest of the labs are pending.  Please notify patient of low potassium and forward results to her PCP.  Patient should discuss her low potassium with her PCP.

## 2024-07-13 LAB — CBC WITH DIFFERENTIAL/PLATELET
Absolute Lymphocytes: 2714 {cells}/uL (ref 850–3900)
Absolute Monocytes: 281 {cells}/uL (ref 200–950)
Basophils Absolute: 32 {cells}/uL (ref 0–200)
Basophils Relative: 0.6 %
Eosinophils Absolute: 32 {cells}/uL (ref 15–500)
Eosinophils Relative: 0.6 %
HCT: 39.2 % (ref 35.0–45.0)
Hemoglobin: 13.1 g/dL (ref 11.7–15.5)
MCH: 30.7 pg (ref 27.0–33.0)
MCHC: 33.4 g/dL (ref 32.0–36.0)
MCV: 91.8 fL (ref 80.0–100.0)
MPV: 9.4 fL (ref 7.5–12.5)
Monocytes Relative: 5.3 %
Neutro Abs: 2242 {cells}/uL (ref 1500–7800)
Neutrophils Relative %: 42.3 %
Platelets: 299 Thousand/uL (ref 140–400)
RBC: 4.27 Million/uL (ref 3.80–5.10)
RDW: 12 % (ref 11.0–15.0)
Total Lymphocyte: 51.2 %
WBC: 5.3 Thousand/uL (ref 3.8–10.8)

## 2024-07-13 LAB — COMPREHENSIVE METABOLIC PANEL WITH GFR
AG Ratio: 2.5 (calc) (ref 1.0–2.5)
ALT: 8 U/L (ref 6–29)
AST: 11 U/L (ref 10–30)
Albumin: 4.8 g/dL (ref 3.6–5.1)
Alkaline phosphatase (APISO): 29 U/L — ABNORMAL LOW (ref 31–125)
BUN: 9 mg/dL (ref 7–25)
CO2: 27 mmol/L (ref 20–32)
Calcium: 9.6 mg/dL (ref 8.6–10.2)
Chloride: 104 mmol/L (ref 98–110)
Creat: 0.67 mg/dL (ref 0.50–0.99)
Globulin: 1.9 g/dL (ref 1.9–3.7)
Glucose, Bld: 65 mg/dL (ref 65–139)
Potassium: 3.2 mmol/L — ABNORMAL LOW (ref 3.5–5.3)
Sodium: 139 mmol/L (ref 135–146)
Total Bilirubin: 0.4 mg/dL (ref 0.2–1.2)
Total Protein: 6.7 g/dL (ref 6.1–8.1)
eGFR: 112 mL/min/1.73m2 (ref >=60)

## 2024-07-13 LAB — SEDIMENTATION RATE: Sed Rate: 2 mm/h (ref 0–20)

## 2024-07-13 LAB — VITAMIN D 25 HYDROXY (VIT D DEFICIENCY, FRACTURES): Vit D, 25-Hydroxy: 28 ng/mL — ABNORMAL LOW (ref 30–100)

## 2024-07-13 LAB — ANTI-DNA ANTIBODY, DOUBLE-STRANDED: ds DNA Ab: <1 [IU]/mL

## 2024-07-13 LAB — C3 AND C4
C3 Complement: 93 mg/dL (ref 83–193)
C4 Complement: 21 mg/dL (ref 15–57)

## 2024-07-13 LAB — PROTEIN / CREATININE RATIO, URINE
Creatinine, Urine: 33 mg/dL (ref 20–275)
Protein/Creat Ratio: 121 mg/g{creat} (ref 24–184)
Protein/Creatinine Ratio: 0.121 mg/mg{creat} (ref 0.024–0.184)
Total Protein, Urine: 4 mg/dL — ABNORMAL LOW (ref 5–24)

## 2024-07-13 LAB — SJOGRENS SYNDROME-B EXTRACTABLE NUCLEAR ANTIBODY: SSB (La) (ENA) Antibody, IgG: <1 AI

## 2024-07-13 LAB — ANA: Anti Nuclear Antibody (ANA): NEGATIVE

## 2024-07-13 LAB — SJOGRENS SYNDROME-A EXTRACTABLE NUCLEAR ANTIBODY: SSA (Ro) (ENA) Antibody, IgG: <1 AI

## 2024-07-14 NOTE — Progress Notes (Signed)
 Vitamin D  is low at 28.  Patient should take vitamin D  2000 units daily.  ANA negative, dsDNA negative, complements normal, sed rate normal, SSA negative, SSB negative.  All autoimmune labs are within normal limits.  Not indicate an active autoimmune disease.

## 2024-07-23 ENCOUNTER — Other Ambulatory Visit: Payer: Self-pay | Admitting: Rheumatology

## 2024-07-23 DIAGNOSIS — I7300 Raynaud's syndrome without gangrene: Secondary | ICD-10-CM

## 2024-07-31 ENCOUNTER — Other Ambulatory Visit: Payer: Self-pay | Admitting: Rheumatology

## 2024-07-31 NOTE — Telephone Encounter (Signed)
 Last Fill: 07/03/2024 (30 day supply)  Eye exam: 09/05/2023 WNL   Labs: 07/11/2024 Urine protein creatinine ratio normal, CBC normal, potassium is low at 3.2, sed rate normal.   Next Visit: 12/11/2024  Last Visit: 07/11/2024  DX: Undifferentiated connective tissue disease   Current Dose per office note 07/11/2024: Plaquenil  200 mg 1 tablet by mouth twice daily M-F only   Okay to refill Plaquenil ?

## 2024-11-05 ENCOUNTER — Other Ambulatory Visit: Payer: Self-pay | Admitting: Rheumatology

## 2024-11-06 NOTE — Telephone Encounter (Signed)
 Last Fill: 07/31/2024  Eye exam: 09/05/2023   Labs: 07/11/2024 Vitamin D  is low at 28. Patient should take vitamin D  2000 units daily. ANA negative, dsDNA negative, complements normal, sed rate normal, SSA negative, SSB negative. All autoimmune labs are within normal limits. Not indicate an active autoimmune disease.   Next Visit: 12/11/2024  Last Visit: 07/11/2024  DX: Undifferentiated connective tissue disease   Current Dose per office note on 07/11/2024: Plaquenil  200 mg 1 tablet by mouth twice daily M-F only   Called patient about PLQ eye exam and patient is scheduled for 12/20/2024.  Okay to refill Plaquenil ?

## 2024-12-11 ENCOUNTER — Ambulatory Visit: Admitting: Physician Assistant
